# Patient Record
Sex: Female | Born: 1935 | Race: Black or African American | Hispanic: No | Smoking: Former smoker
Health system: Southern US, Community
[De-identification: ages and names within clinical notes are randomized; demographics above are authoritative.]

## PROBLEM LIST (undated history)

## (undated) DIAGNOSIS — E119 Type 2 diabetes mellitus without complications: Secondary | ICD-10-CM

## (undated) DIAGNOSIS — I10 Essential (primary) hypertension: Secondary | ICD-10-CM

## (undated) DIAGNOSIS — D649 Anemia, unspecified: Secondary | ICD-10-CM

## (undated) DIAGNOSIS — G459 Transient cerebral ischemic attack, unspecified: Secondary | ICD-10-CM

## (undated) DIAGNOSIS — R011 Cardiac murmur, unspecified: Secondary | ICD-10-CM

## (undated) DIAGNOSIS — K5790 Diverticulosis of intestine, part unspecified, without perforation or abscess without bleeding: Secondary | ICD-10-CM

## (undated) DIAGNOSIS — I639 Cerebral infarction, unspecified: Secondary | ICD-10-CM

## (undated) DIAGNOSIS — R2689 Other abnormalities of gait and mobility: Secondary | ICD-10-CM

## (undated) DIAGNOSIS — E7800 Pure hypercholesterolemia, unspecified: Secondary | ICD-10-CM

## (undated) HISTORY — PX: HERNIA REPAIR: SHX51

## (undated) HISTORY — PX: ECTOPIC PREGNANCY SURGERY: SHX613

## (undated) HISTORY — PX: TONSILLECTOMY: SUR1361

## (undated) HISTORY — PX: COLON SURGERY: SHX602

---

## 2004-04-14 DIAGNOSIS — G459 Transient cerebral ischemic attack, unspecified: Secondary | ICD-10-CM

## 2004-04-14 HISTORY — DX: Transient cerebral ischemic attack, unspecified: G45.9

## 2004-05-07 ENCOUNTER — Ambulatory Visit: Payer: Self-pay | Admitting: Unknown Physician Specialty

## 2004-05-21 ENCOUNTER — Ambulatory Visit: Payer: Self-pay | Admitting: Family Medicine

## 2004-10-22 ENCOUNTER — Ambulatory Visit: Payer: Self-pay | Admitting: Family Medicine

## 2005-07-09 ENCOUNTER — Ambulatory Visit: Payer: Self-pay | Admitting: Family Medicine

## 2005-07-13 ENCOUNTER — Other Ambulatory Visit: Payer: Self-pay

## 2005-07-13 ENCOUNTER — Emergency Department: Payer: Self-pay | Admitting: Emergency Medicine

## 2005-09-09 ENCOUNTER — Encounter: Payer: Self-pay | Admitting: Psychiatry

## 2005-09-12 ENCOUNTER — Encounter: Payer: Self-pay | Admitting: Psychiatry

## 2005-10-12 ENCOUNTER — Encounter: Payer: Self-pay | Admitting: Psychiatry

## 2005-10-22 ENCOUNTER — Emergency Department: Payer: Self-pay

## 2005-10-22 ENCOUNTER — Other Ambulatory Visit: Payer: Self-pay

## 2005-11-26 ENCOUNTER — Encounter: Payer: Self-pay | Admitting: Psychiatry

## 2005-12-04 ENCOUNTER — Inpatient Hospital Stay: Payer: Self-pay

## 2005-12-04 ENCOUNTER — Other Ambulatory Visit: Payer: Self-pay

## 2006-09-01 ENCOUNTER — Ambulatory Visit: Payer: Self-pay | Admitting: Family Medicine

## 2007-03-28 ENCOUNTER — Other Ambulatory Visit: Payer: Self-pay

## 2007-03-28 ENCOUNTER — Observation Stay: Payer: Self-pay | Admitting: Internal Medicine

## 2007-04-06 ENCOUNTER — Emergency Department: Payer: Self-pay | Admitting: Emergency Medicine

## 2007-04-23 ENCOUNTER — Ambulatory Visit: Payer: Self-pay | Admitting: Family Medicine

## 2007-06-30 ENCOUNTER — Other Ambulatory Visit: Payer: Self-pay

## 2007-07-01 ENCOUNTER — Inpatient Hospital Stay: Payer: Self-pay | Admitting: *Deleted

## 2007-08-11 ENCOUNTER — Ambulatory Visit: Payer: Self-pay | Admitting: Unknown Physician Specialty

## 2007-08-12 ENCOUNTER — Ambulatory Visit: Payer: Self-pay | Admitting: Unknown Physician Specialty

## 2007-08-22 ENCOUNTER — Emergency Department: Payer: Self-pay | Admitting: Emergency Medicine

## 2007-10-08 ENCOUNTER — Ambulatory Visit: Payer: Self-pay | Admitting: Family Medicine

## 2007-10-19 ENCOUNTER — Ambulatory Visit: Payer: Self-pay | Admitting: Family Medicine

## 2007-11-10 ENCOUNTER — Inpatient Hospital Stay: Payer: Self-pay | Admitting: Internal Medicine

## 2007-11-10 ENCOUNTER — Other Ambulatory Visit: Payer: Self-pay

## 2007-11-16 ENCOUNTER — Ambulatory Visit: Payer: Self-pay | Admitting: Family Medicine

## 2007-11-24 ENCOUNTER — Encounter: Payer: Self-pay | Admitting: Family Medicine

## 2007-12-14 ENCOUNTER — Encounter: Payer: Self-pay | Admitting: Family Medicine

## 2008-06-01 IMAGING — US US RENAL KIDNEY
1 series · 17 of 25 positions shown · non-contrast
Comparison: none

REASON FOR EXAM: Renal Insufficiency
COMMENTS:

[Series 1: us renal kidney · 17 of 34 slices shown]
[im 1/34]
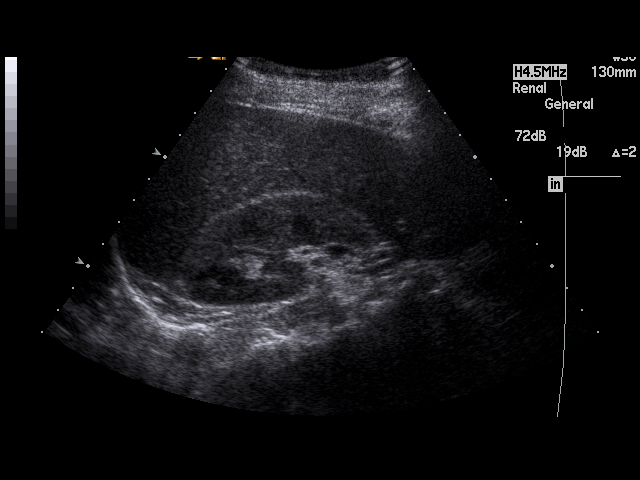
[im 3/34]
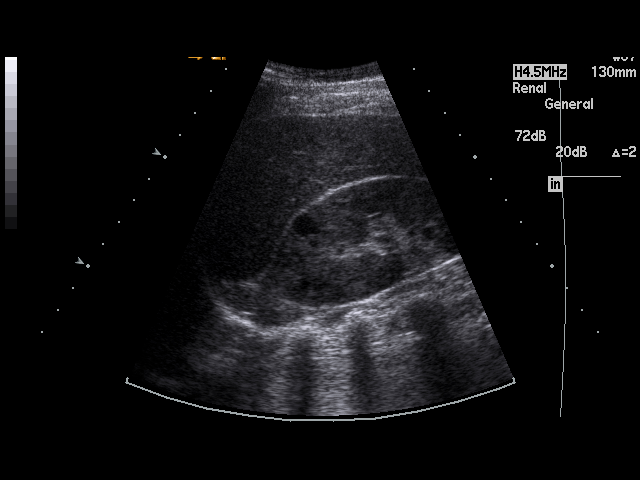
[im 5/34]
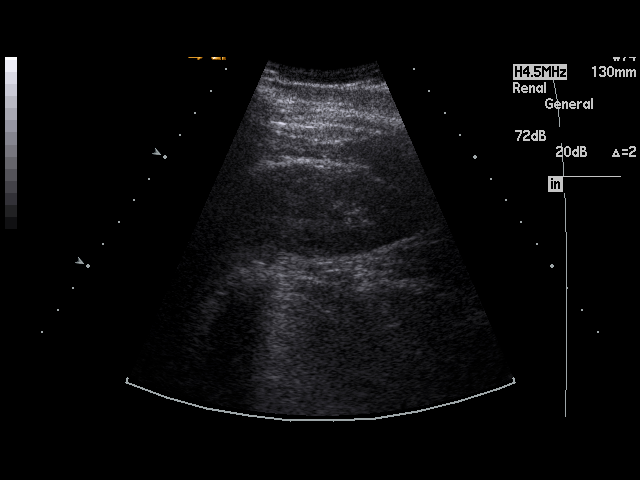
[im 7/34]
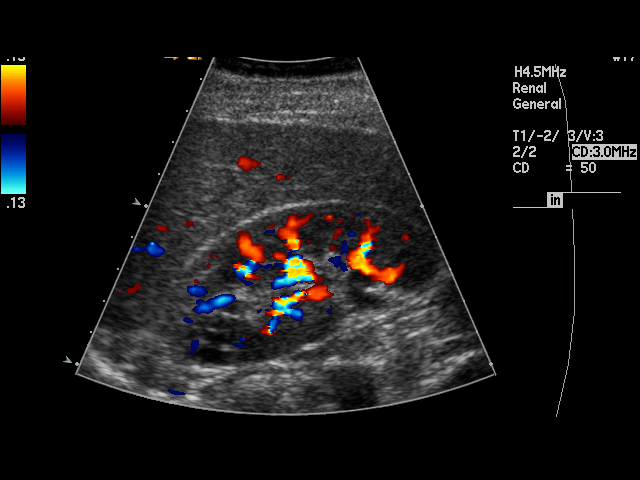
[im 9/34]
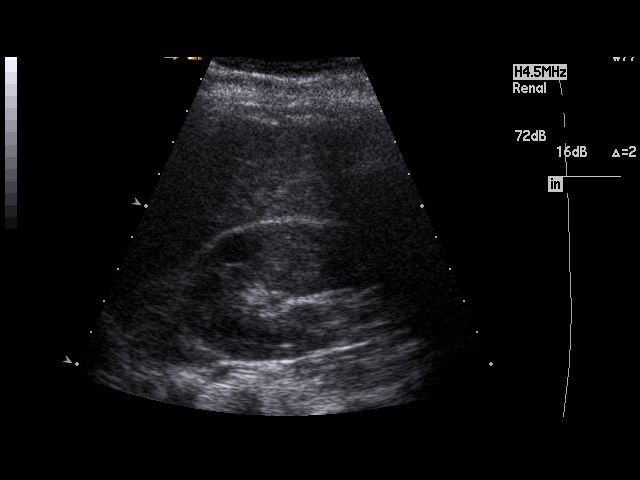
[im 12/34]
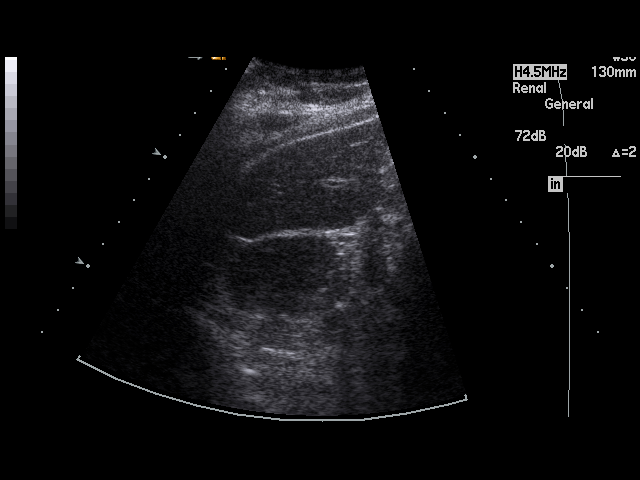
[im 13/34]
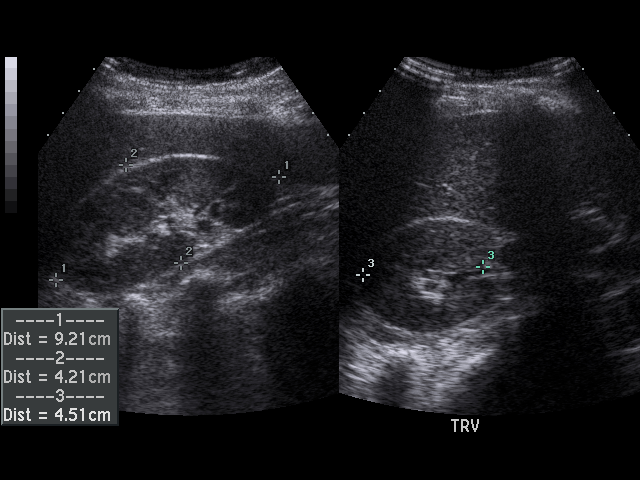
[im 16/34]
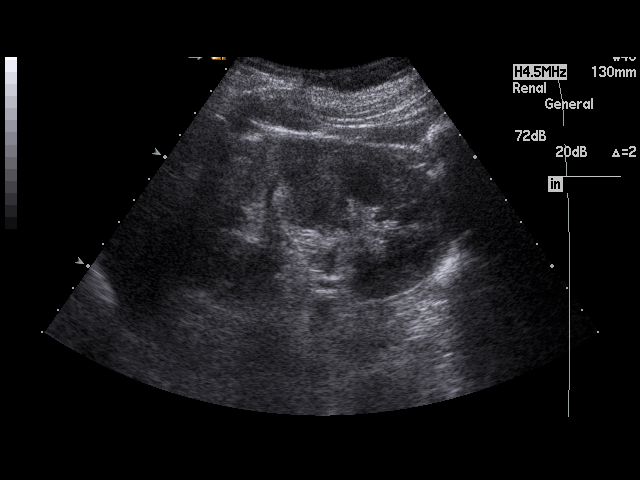
[im 17/34]
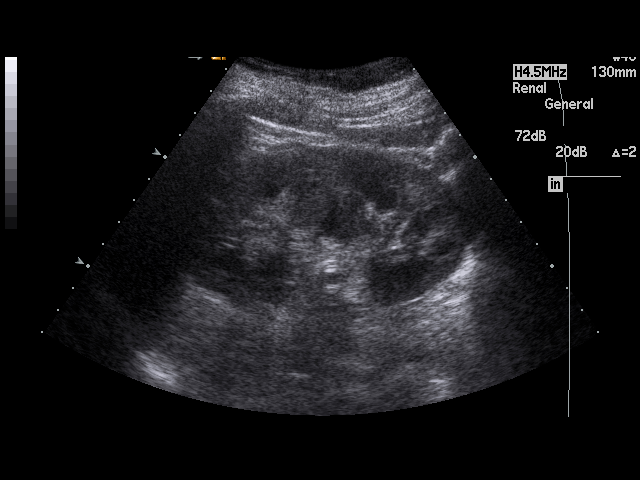
[im 18/34]
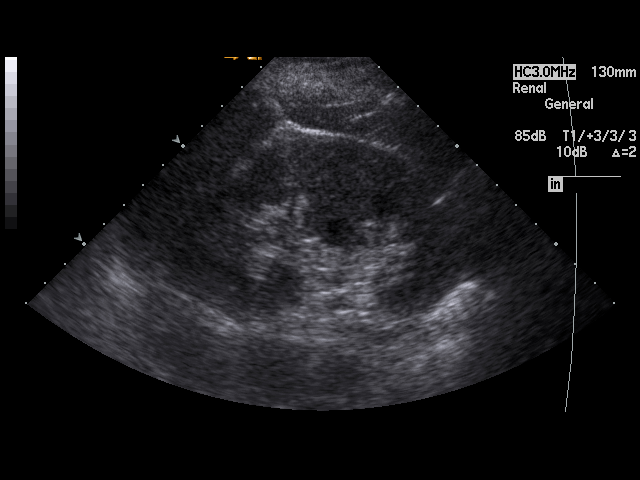
[im 21/34]
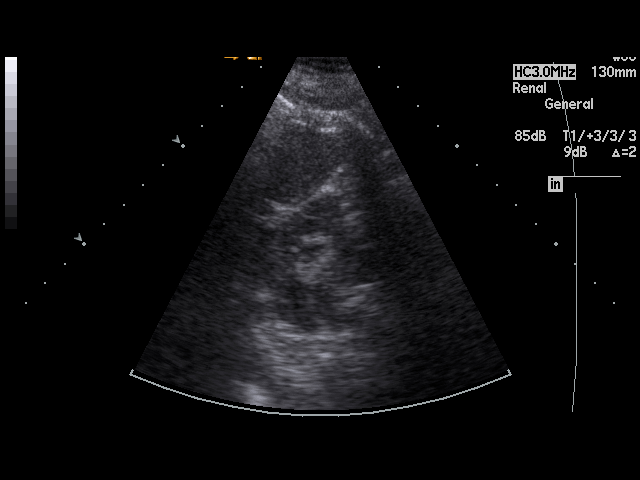
[im 23/34]
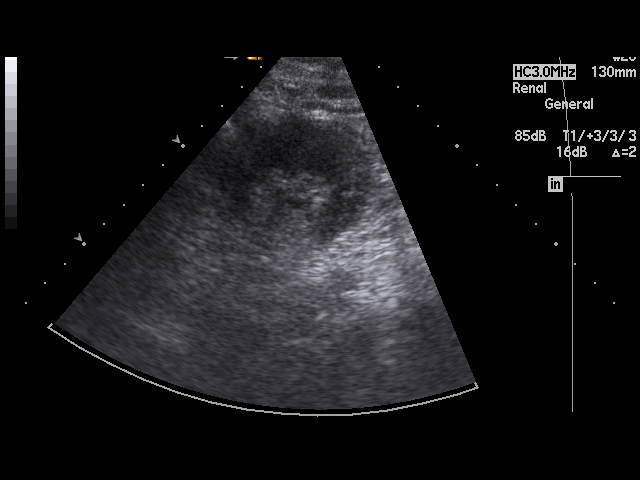
[im 25/34]
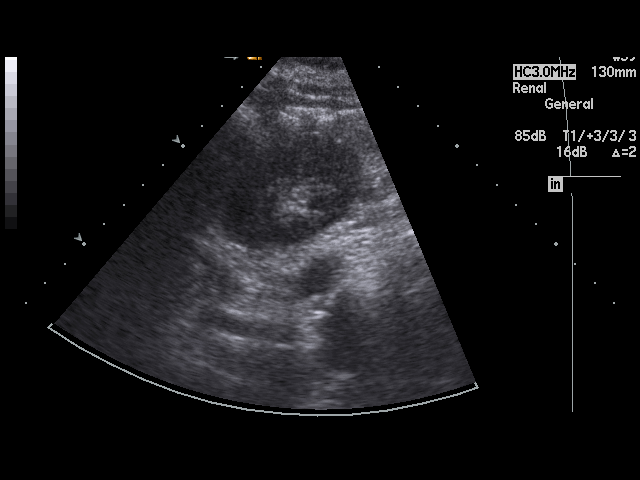
[im 27/34]
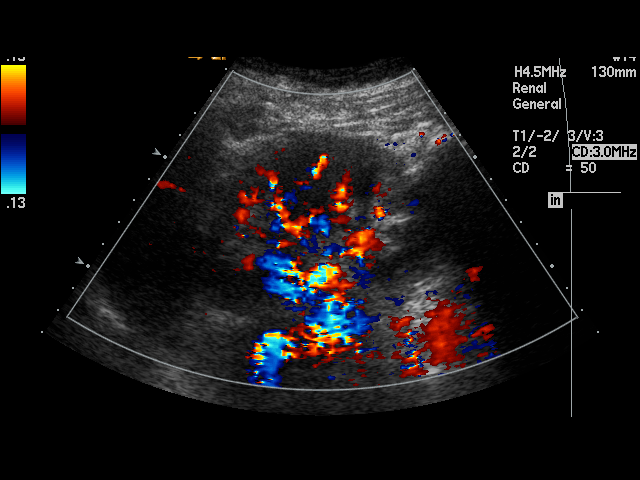
[im 29/34]
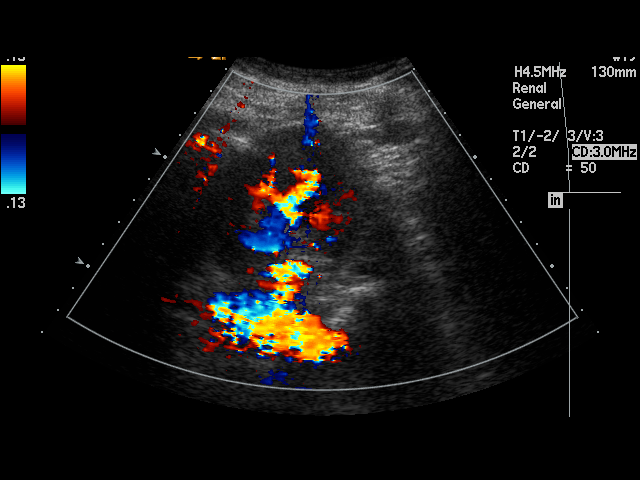
[im 31/34]
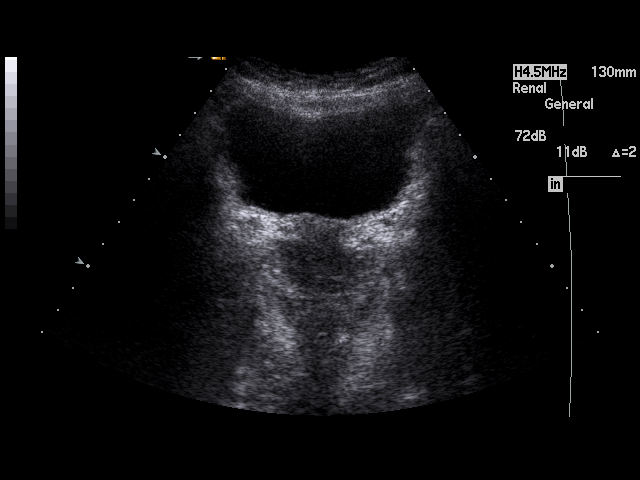
[im 34/34]
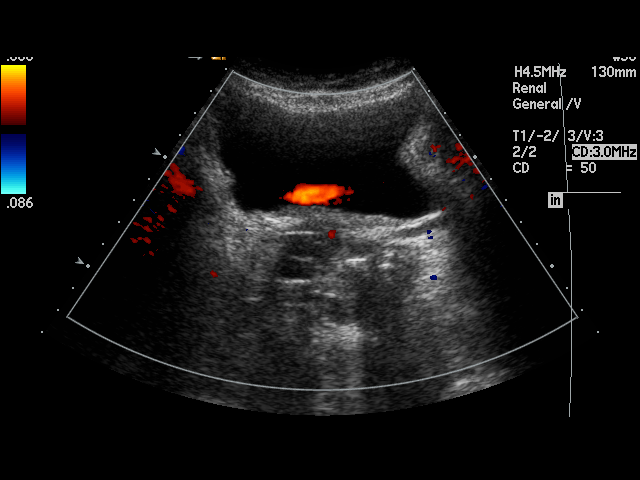

[17 of 25 positions shown; findings below may reference images not displayed]

PROCEDURE:     US  - US KIDNEY BILATERAL  - April 23, 2007  [DATE]

RESULT:     The RIGHT kidney measures 9.2 cm x 4.2 cm x 4.5 cm and the LEFT
kidney measures 9.89 cm x 6.1 cm x 4.45 cm.  There is a 1.05 cm cyst to the
RIGHT kidney. No other solid or cystic renal mass lesions are seen. No renal
calcifications are identified. The renal cortical margins are smooth. There
is no hydronephrosis. The kidneys show increased echogenicity bilaterally
compatible with the clinical history of renal insufficiency. The urinary
bladder is normal in appearance. There is no ascites.
IMPRESSION: 1. There is a 1.05 cm cyst to the upper pole of the RIGHT kidney.
2. The kidneys show increased echogenicity compatible with the clinical
history of renal insufficiency.
3. No hydronephrosis or other acute change is identified.

## 2008-07-27 ENCOUNTER — Encounter: Payer: Self-pay | Admitting: Family Medicine

## 2008-08-12 ENCOUNTER — Encounter: Payer: Self-pay | Admitting: Family Medicine

## 2008-09-12 ENCOUNTER — Encounter: Payer: Self-pay | Admitting: Family Medicine

## 2008-11-17 ENCOUNTER — Ambulatory Visit: Payer: Self-pay | Admitting: Family Medicine

## 2008-12-19 IMAGING — CT CT HEAD WITHOUT CONTRAST
2 series · 16 of 30 positions shown, 20 images · non-contrast
Comparison: none

REASON FOR EXAM: slurred speech
COMMENTS:

[Series 2: without · axial · non-contrast · 0.39mm/px · z∈[+715,+835]mm · 13 of 28 slices shown, 17 images]
[im 2/28  brain]
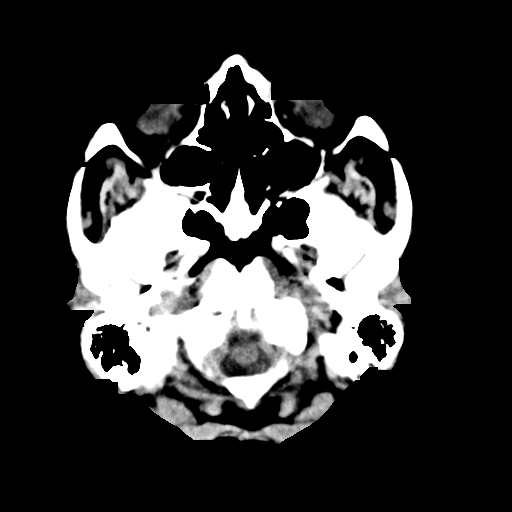
[im 2/28  bone]
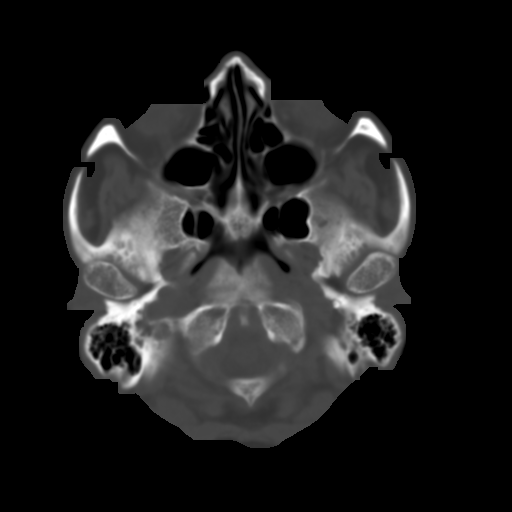
[im 4/28  brain]
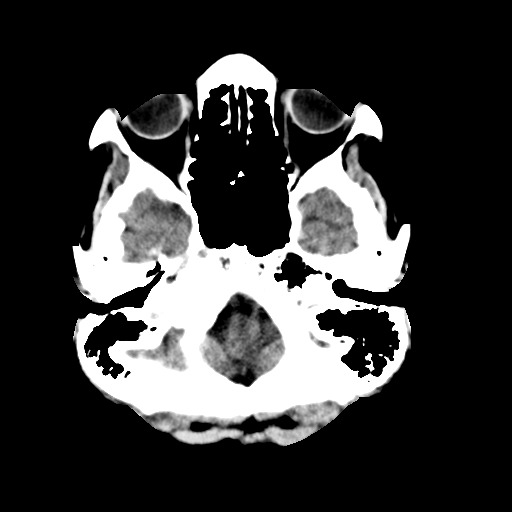
[im 6/28  brain]
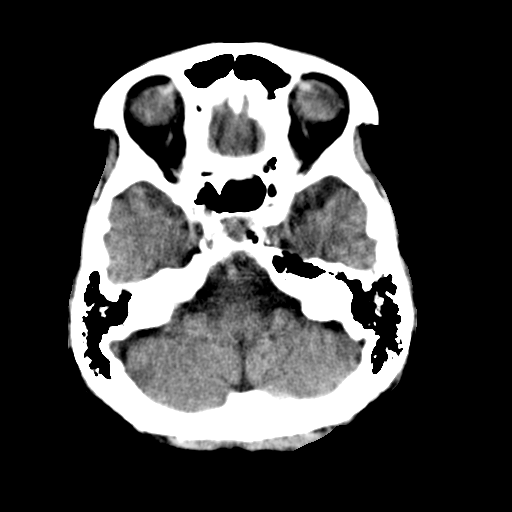
[im 8/28  brain]
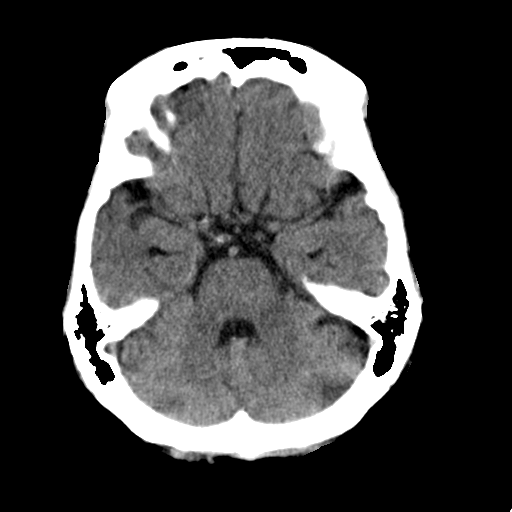
[im 10/28  brain]
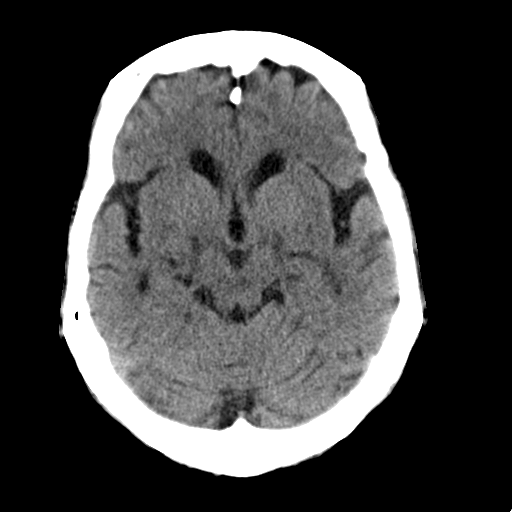
[im 10/28  bone]
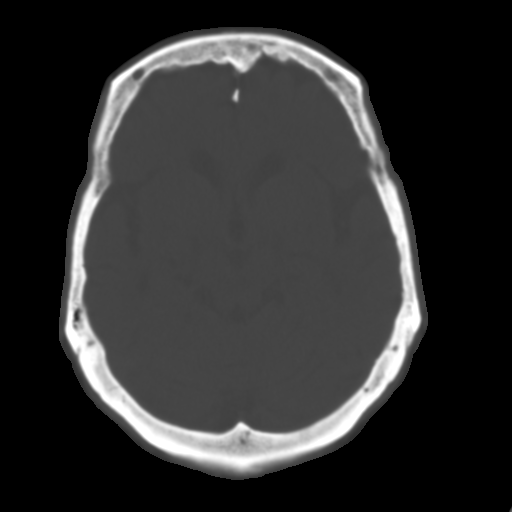
[im 12/28  brain]
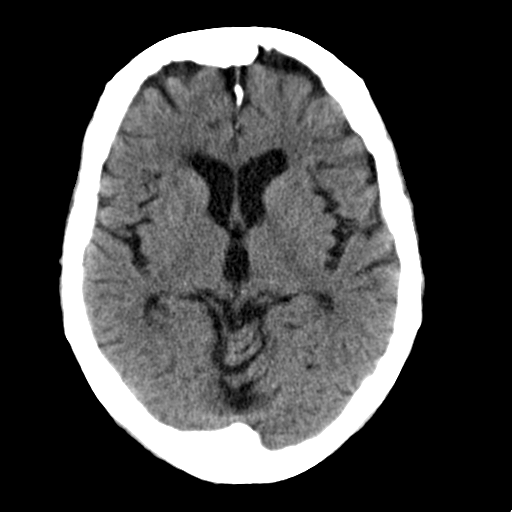
[im 14/28  brain]
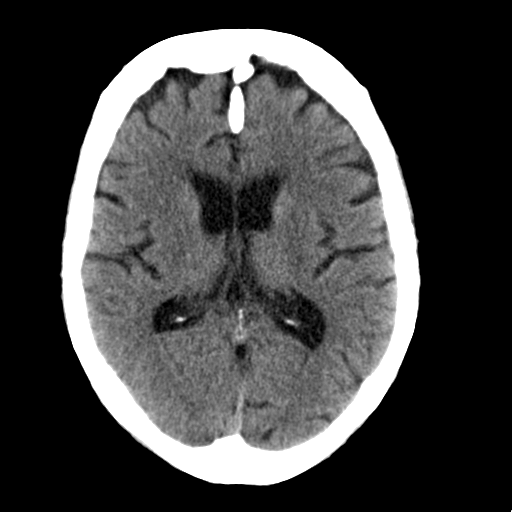
[im 16/28  brain]
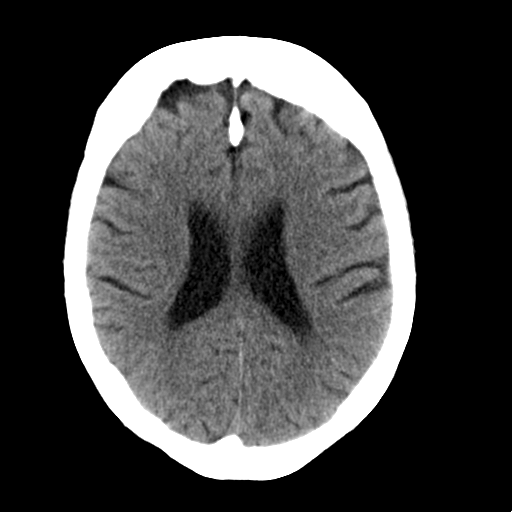
[im 18/28  brain]
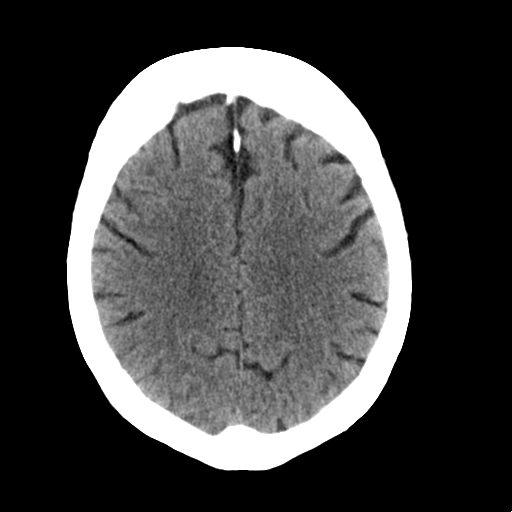
[im 18/28  bone]
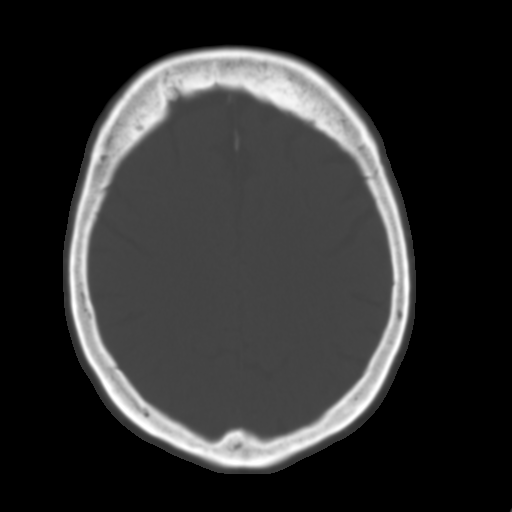
[im 20/28  brain]
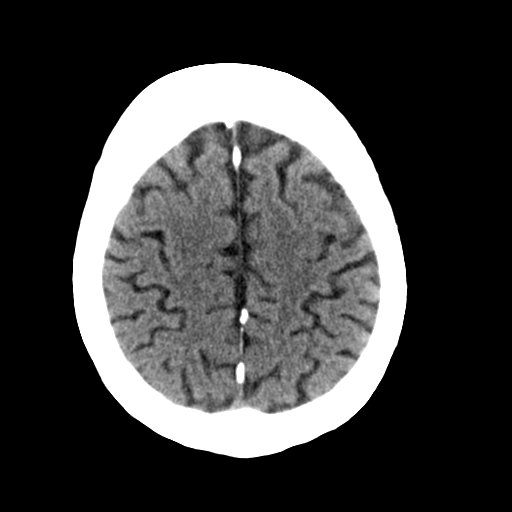
[im 22/28  brain]
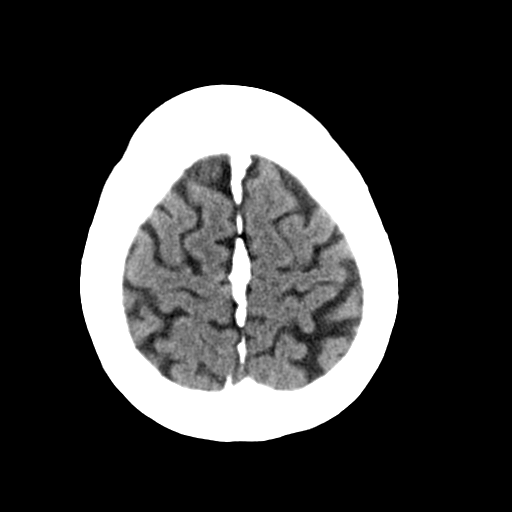
[im 24/28  brain]
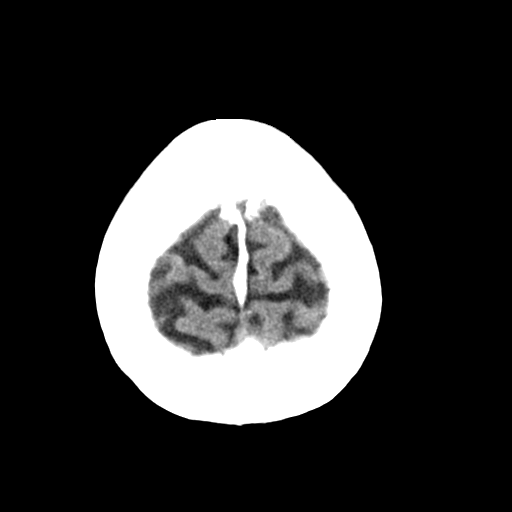
[im 26/28  brain]
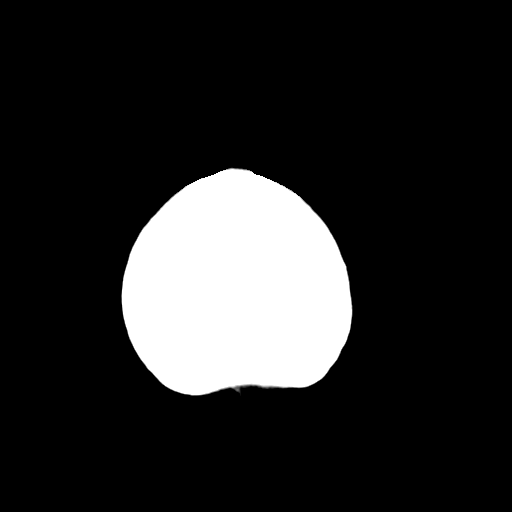
[im 26/28  bone]
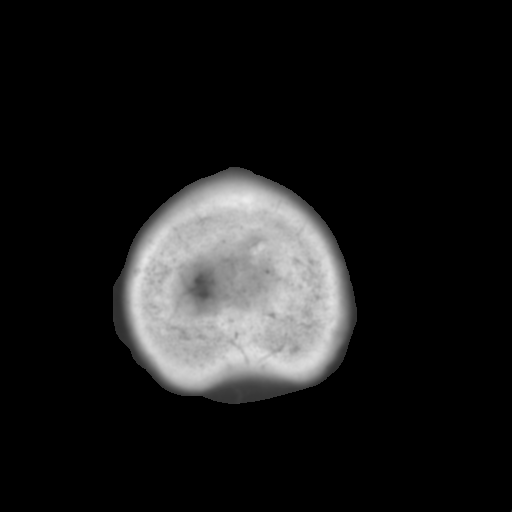

[Series 3: bone · axial · 0.39mm/px · z∈[+715,+755]mm · 3 of 28 slices shown]
[im 2/28  bone]
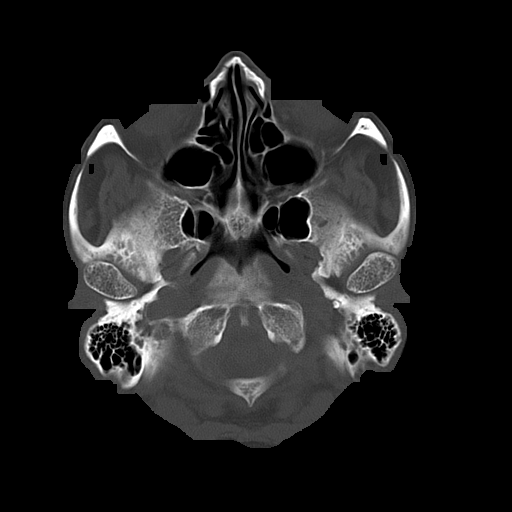
[im 6/28  bone]
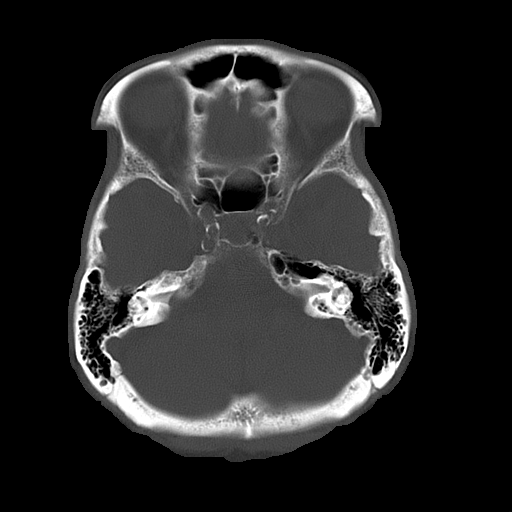
[im 10/28  bone]
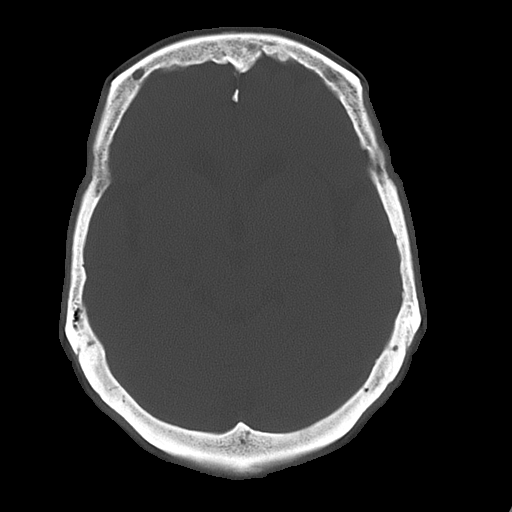

[16 of 30 positions shown; findings below may reference images not displayed]

PROCEDURE:     CT  - CT HEAD WITHOUT CONTRAST  - November 10, 2007  [DATE]

RESULT:     Noncontrasted emergent CT of the brain is performed. There is no
previous exam for comparison.

 There is prominence of the ventricles and sulci consistent with atrophy.
There is ill-defined low attenuation in the periventricular white matter
suggestive of chronic microvascular ischemic disease. There is no
hemorrhage, mass, mass-effect or midline shift. The included paranasal
sinuses and mastoids show normal aeration. There is no skull fracture or
bony destruction.
IMPRESSION: No acute intracranial abnormality evident. Changes of
atrophy and chronic microvascular ischemic disease are present.

## 2008-12-19 IMAGING — CR DG CHEST 1V PORT
1 series · 1 of 1 positions shown · non-contrast
Comparison: none

REASON FOR EXAM: Hypertension, TIA
COMMENTS:

[view not recorded]
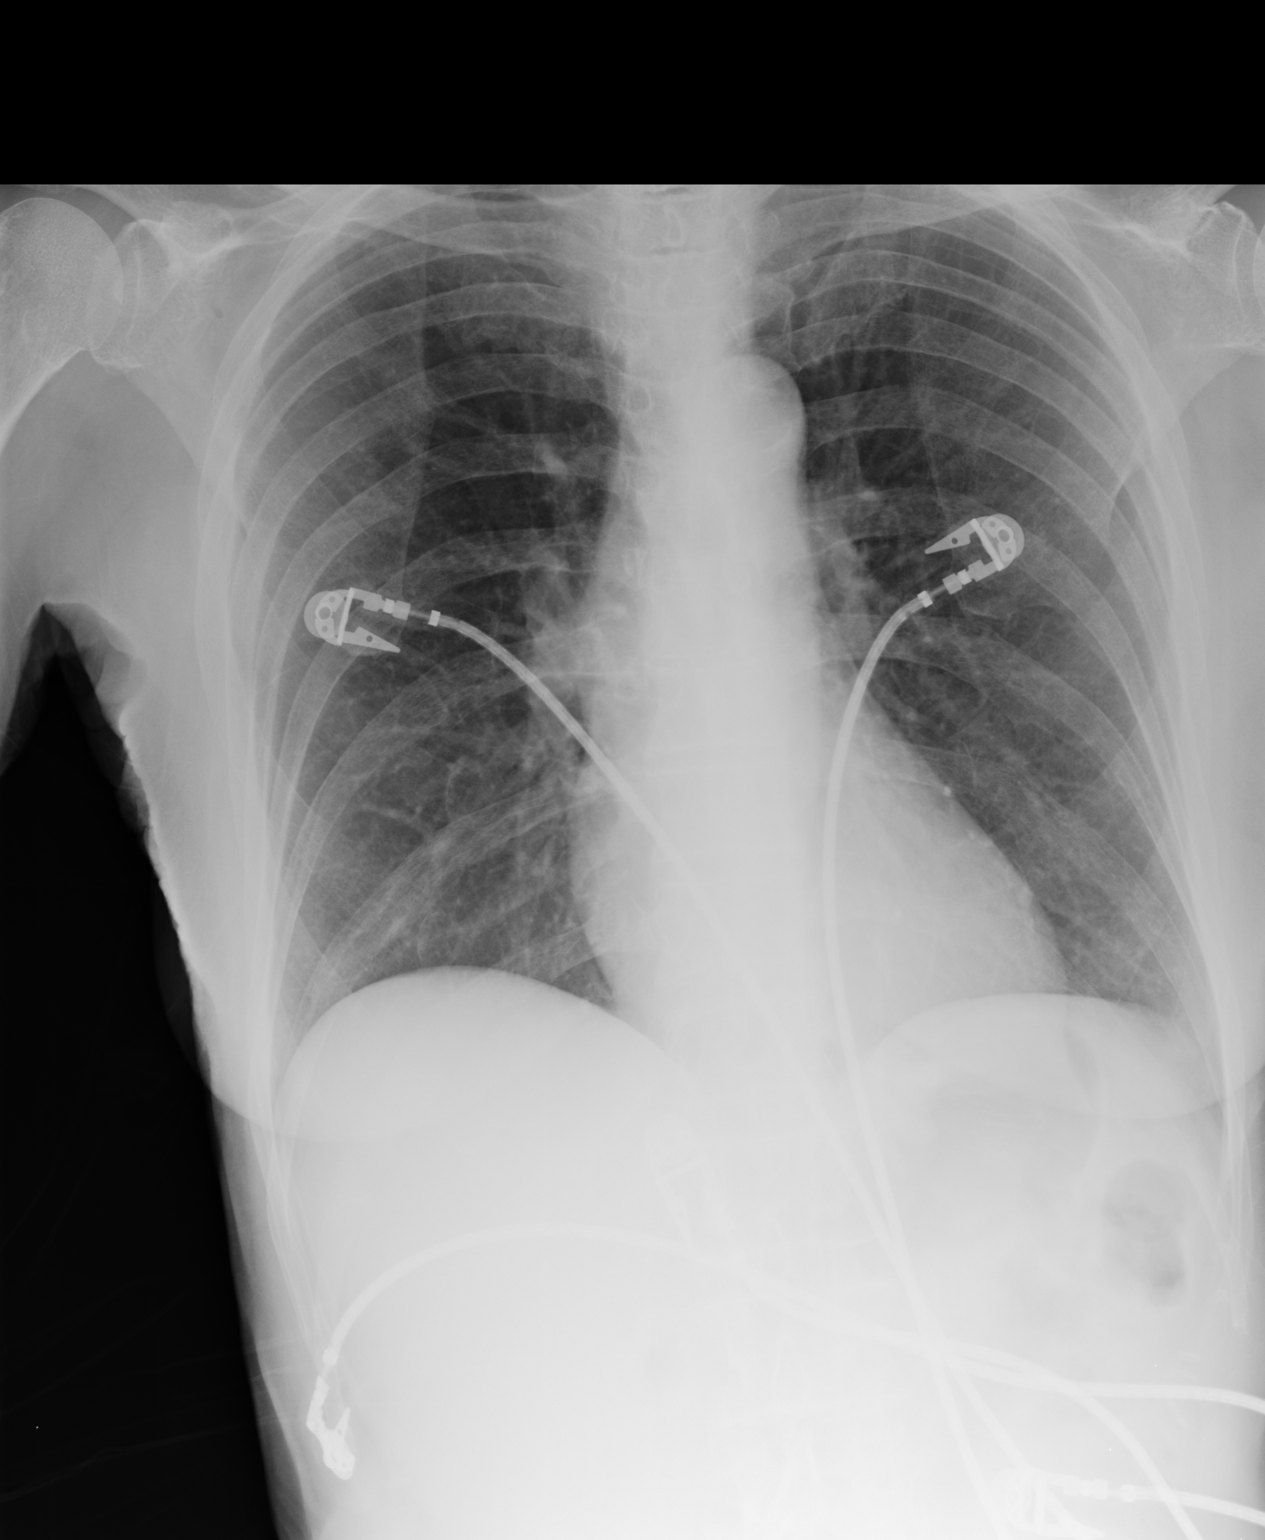

[1 of 1 positions shown; findings below may reference images not displayed]

PROCEDURE:     DXR - DXR PORTABLE CHEST SINGLE VIEW  - November 10, 2007  [DATE]

RESULT:     Comparison is made to the examination dated 10/19/2007.

Cardiac monitoring electrodes are present. The cardiac silhouette is normal.
There is no evidence of infiltrate, effusion or pneumothorax. There is no
underlying mass. There is no significant change accounting for differences
in technique.
IMPRESSION: No acute cardiopulmonary disease. There is mild
hyperinflation.

## 2009-03-12 ENCOUNTER — Emergency Department: Payer: Self-pay | Admitting: Emergency Medicine

## 2009-03-28 ENCOUNTER — Inpatient Hospital Stay: Payer: Self-pay | Admitting: Internal Medicine

## 2009-11-20 ENCOUNTER — Ambulatory Visit: Payer: Self-pay | Admitting: Family Medicine

## 2009-11-28 ENCOUNTER — Ambulatory Visit: Payer: Self-pay | Admitting: Family Medicine

## 2011-03-17 ENCOUNTER — Encounter: Payer: Self-pay | Admitting: Family Medicine

## 2011-03-25 ENCOUNTER — Ambulatory Visit: Payer: Self-pay | Admitting: Family Medicine

## 2011-04-04 ENCOUNTER — Ambulatory Visit: Payer: Self-pay | Admitting: Family Medicine

## 2011-04-15 ENCOUNTER — Ambulatory Visit: Payer: Self-pay | Admitting: Family Medicine

## 2011-04-15 ENCOUNTER — Encounter: Payer: Self-pay | Admitting: Family Medicine

## 2011-05-16 ENCOUNTER — Ambulatory Visit: Payer: Self-pay | Admitting: Family Medicine

## 2011-05-26 ENCOUNTER — Emergency Department: Payer: Self-pay | Admitting: Emergency Medicine

## 2011-06-13 ENCOUNTER — Ambulatory Visit: Payer: Self-pay | Admitting: Family Medicine

## 2011-12-19 ENCOUNTER — Ambulatory Visit: Payer: Self-pay | Admitting: Family Medicine

## 2012-03-31 ENCOUNTER — Ambulatory Visit: Payer: Self-pay | Admitting: Family Medicine

## 2012-08-24 ENCOUNTER — Inpatient Hospital Stay: Payer: Self-pay | Admitting: Internal Medicine

## 2012-08-24 LAB — URINALYSIS, COMPLETE
Bilirubin,UR: NEGATIVE
Nitrite: NEGATIVE
Protein: NEGATIVE
RBC,UR: <1 /HPF (ref 0–5)
Specific Gravity: 1.017 (ref 1.003–1.030)
Squamous Epithelial: 1

## 2012-08-24 LAB — COMPREHENSIVE METABOLIC PANEL
Anion Gap: 3 — ABNORMAL LOW (ref 7–16)
Bilirubin,Total: 0.3 mg/dL (ref 0.2–1.0)
Calcium, Total: 9.4 mg/dL (ref 8.5–10.1)
Chloride: 104 mmol/L (ref 98–107)
Co2: 30 mmol/L (ref 21–32)
EGFR (African American): >60
EGFR (Non-African Amer.): >60
SGOT(AST): 31 U/L (ref 15–37)
SGPT (ALT): 23 U/L (ref 12–78)
Sodium: 137 mmol/L (ref 136–145)
Total Protein: 8.3 g/dL — ABNORMAL HIGH (ref 6.4–8.2)

## 2012-08-24 LAB — CBC
HCT: 30.7 % — ABNORMAL LOW (ref 35.0–47.0)
HGB: 10.2 g/dL — ABNORMAL LOW (ref 12.0–16.0)
Platelet: 233 10*3/uL (ref 150–440)
RBC: 3.74 10*6/uL — ABNORMAL LOW (ref 3.80–5.20)
RDW: 14.6 % — ABNORMAL HIGH (ref 11.5–14.5)
WBC: 6.2 10*3/uL (ref 3.6–11.0)

## 2012-08-24 LAB — LIPASE, BLOOD: Lipase: 62 U/L — ABNORMAL LOW (ref 73–393)

## 2012-08-25 LAB — BASIC METABOLIC PANEL
Chloride: 109 mmol/L — ABNORMAL HIGH (ref 98–107)
Co2: 28 mmol/L (ref 21–32)
Glucose: 97 mg/dL (ref 65–99)
Osmolality: 283 (ref 275–301)
Sodium: 141 mmol/L (ref 136–145)

## 2012-08-25 LAB — CBC WITH DIFFERENTIAL/PLATELET
Basophil #: 0.1 10*3/uL (ref 0.0–0.1)
Basophil %: 0.7 %
Eosinophil #: 0.1 10*3/uL (ref 0.0–0.7)
Eosinophil %: 1.1 %
HCT: 25.4 % — ABNORMAL LOW (ref 35.0–47.0)
HGB: 8.6 g/dL — ABNORMAL LOW (ref 12.0–16.0)
Lymphocyte #: 1.8 10*3/uL (ref 1.0–3.6)
Lymphocyte %: 24.9 %
MCH: 27.6 pg (ref 26.0–34.0)
MCHC: 33.8 g/dL (ref 32.0–36.0)
MCV: 82 fL (ref 80–100)
Monocyte #: 0.6 x10 3/mm (ref 0.2–0.9)
Monocyte %: 8.8 %
Neutrophil #: 4.7 10*3/uL (ref 1.4–6.5)
Neutrophil %: 64.5 %
Platelet: 182 10*3/uL (ref 150–440)
RBC: 3.11 10*6/uL — ABNORMAL LOW (ref 3.80–5.20)
RDW: 14.3 % (ref 11.5–14.5)
WBC: 7.2 10*3/uL (ref 3.6–11.0)

## 2012-08-25 LAB — HEMOGLOBIN: HGB: 9.6 g/dL — ABNORMAL LOW (ref 12.0–16.0)

## 2012-08-26 LAB — CBC WITH DIFFERENTIAL/PLATELET
Basophil #: 0.1 10*3/uL (ref 0.0–0.1)
Eosinophil #: 0.3 10*3/uL (ref 0.0–0.7)
HCT: 27.2 % — ABNORMAL LOW (ref 35.0–47.0)
Lymphocyte #: 1.8 10*3/uL (ref 1.0–3.6)
MCH: 28.1 pg (ref 26.0–34.0)
MCV: 82 fL (ref 80–100)
Monocyte #: 0.6 x10 3/mm (ref 0.2–0.9)
Neutrophil #: 4.2 10*3/uL (ref 1.4–6.5)
Neutrophil %: 60.6 %
Platelet: 211 10*3/uL (ref 150–440)
RBC: 3.31 10*6/uL — ABNORMAL LOW (ref 3.80–5.20)

## 2012-08-26 LAB — BASIC METABOLIC PANEL
Anion Gap: 6 — ABNORMAL LOW (ref 7–16)
BUN: 13 mg/dL (ref 7–18)
Calcium, Total: 9 mg/dL (ref 8.5–10.1)
Chloride: 109 mmol/L — ABNORMAL HIGH (ref 98–107)
EGFR (Non-African Amer.): >60
Glucose: 76 mg/dL (ref 65–99)
Potassium: 3.6 mmol/L (ref 3.5–5.1)
Sodium: 141 mmol/L (ref 136–145)

## 2012-08-27 LAB — CBC WITH DIFFERENTIAL/PLATELET
Basophil #: 0.1 10*3/uL (ref 0.0–0.1)
Basophil %: 0.8 %
Eosinophil %: 2.3 %
HGB: 8.4 g/dL — ABNORMAL LOW (ref 12.0–16.0)
Lymphocyte #: 2.3 10*3/uL (ref 1.0–3.6)
Lymphocyte %: 31.5 %
MCH: 27.6 pg (ref 26.0–34.0)
MCHC: 33.5 g/dL (ref 32.0–36.0)
Monocyte #: 0.7 x10 3/mm (ref 0.2–0.9)
Neutrophil #: 4.1 10*3/uL (ref 1.4–6.5)
WBC: 7.3 10*3/uL (ref 3.6–11.0)

## 2012-08-27 LAB — HEMOGLOBIN: HGB: 8.7 g/dL — ABNORMAL LOW (ref 12.0–16.0)

## 2012-08-28 LAB — BASIC METABOLIC PANEL
Anion Gap: 5 — ABNORMAL LOW (ref 7–16)
BUN: 7 mg/dL (ref 7–18)
Calcium, Total: 8.4 mg/dL — ABNORMAL LOW (ref 8.5–10.1)
Chloride: 112 mmol/L — ABNORMAL HIGH (ref 98–107)
Co2: 27 mmol/L (ref 21–32)
Creatinine: 0.64 mg/dL (ref 0.60–1.30)
EGFR (Non-African Amer.): >60
Glucose: 96 mg/dL (ref 65–99)
Osmolality: 285 (ref 275–301)
Potassium: 3.5 mmol/L (ref 3.5–5.1)

## 2012-08-28 LAB — CBC WITH DIFFERENTIAL/PLATELET
Basophil #: 0 10*3/uL (ref 0.0–0.1)
Basophil %: 0.6 %
Eosinophil %: 4.5 %
HGB: 7.3 g/dL — ABNORMAL LOW (ref 12.0–16.0)
Lymphocyte #: 1.6 10*3/uL (ref 1.0–3.6)
Lymphocyte %: 25.1 %
MCH: 28.4 pg (ref 26.0–34.0)
MCHC: 34.4 g/dL (ref 32.0–36.0)
Monocyte #: 0.6 x10 3/mm (ref 0.2–0.9)
Neutrophil %: 60.1 %
RBC: 2.57 10*6/uL — ABNORMAL LOW (ref 3.80–5.20)
RDW: 14.6 % — ABNORMAL HIGH (ref 11.5–14.5)
WBC: 6.3 10*3/uL (ref 3.6–11.0)

## 2012-08-29 LAB — TSH: Thyroid Stimulating Horm: 0.874 u[IU]/mL

## 2012-08-29 LAB — HEMOGLOBIN: HGB: 6.8 g/dL — ABNORMAL LOW (ref 12.0–16.0)

## 2012-12-14 ENCOUNTER — Inpatient Hospital Stay: Payer: Self-pay | Admitting: Surgery

## 2012-12-14 LAB — COMPREHENSIVE METABOLIC PANEL
Albumin: 3.5 g/dL (ref 3.4–5.0)
Alkaline Phosphatase: 66 U/L (ref 50–136)
Anion Gap: 7 (ref 7–16)
Bilirubin,Total: 0.1 mg/dL — ABNORMAL LOW (ref 0.2–1.0)
Calcium, Total: 9.2 mg/dL (ref 8.5–10.1)
Chloride: 105 mmol/L (ref 98–107)
Co2: 25 mmol/L (ref 21–32)
Creatinine: 0.8 mg/dL (ref 0.60–1.30)
EGFR (Non-African Amer.): >60
Glucose: 223 mg/dL — ABNORMAL HIGH (ref 65–99)
Osmolality: 282 (ref 275–301)
Potassium: 4.3 mmol/L (ref 3.5–5.1)
SGOT(AST): 34 U/L (ref 15–37)
SGPT (ALT): 24 U/L (ref 12–78)
Total Protein: 8.8 g/dL — ABNORMAL HIGH (ref 6.4–8.2)

## 2012-12-14 LAB — PROTIME-INR: Prothrombin Time: 13.5 secs (ref 11.5–14.7)

## 2012-12-14 LAB — CBC
HCT: 34.5 % — ABNORMAL LOW (ref 35.0–47.0)
Platelet: 247 10*3/uL (ref 150–440)
WBC: 7.3 10*3/uL (ref 3.6–11.0)

## 2012-12-14 LAB — APTT: Activated PTT: 37.4 secs — ABNORMAL HIGH (ref 23.6–35.9)

## 2012-12-15 LAB — HEMOGLOBIN: HGB: 9 g/dL — ABNORMAL LOW (ref 12.0–16.0)

## 2012-12-16 LAB — HEMOGLOBIN
HGB: 8.3 g/dL — ABNORMAL LOW (ref 12.0–16.0)
HGB: 9.2 g/dL — ABNORMAL LOW (ref 12.0–16.0)
HGB: 8.4 g/dL — ABNORMAL LOW (ref 12.0–16.0)

## 2012-12-17 LAB — HEMOGLOBIN: HGB: 7.6 g/dL — ABNORMAL LOW (ref 12.0–16.0)

## 2012-12-18 LAB — CBC WITH DIFFERENTIAL/PLATELET
Basophil #: 0 10*3/uL (ref 0.0–0.1)
Basophil %: 0.4 %
Eosinophil #: 0.1 10*3/uL (ref 0.0–0.7)
HCT: 20.2 % — ABNORMAL LOW (ref 35.0–47.0)
HGB: 6.7 g/dL — ABNORMAL LOW (ref 12.0–16.0)
Lymphocyte #: 1.8 10*3/uL (ref 1.0–3.6)
Lymphocyte %: 18.7 %
Monocyte #: 0.6 x10 3/mm (ref 0.2–0.9)
Monocyte %: 6.6 %
Neutrophil %: 73.2 %
Platelet: 196 10*3/uL (ref 150–440)
RBC: 2.46 10*6/uL — ABNORMAL LOW (ref 3.80–5.20)
RDW: 15 % — ABNORMAL HIGH (ref 11.5–14.5)
WBC: 9.7 10*3/uL (ref 3.6–11.0)

## 2012-12-20 LAB — CBC WITH DIFFERENTIAL/PLATELET
Basophil #: 0 10*3/uL (ref 0.0–0.1)
Eosinophil #: 0 10*3/uL (ref 0.0–0.7)
HCT: 23.3 % — ABNORMAL LOW (ref 35.0–47.0)
Lymphocyte #: 1.1 10*3/uL (ref 1.0–3.6)
Lymphocyte %: 11.7 %
MCH: 27.5 pg (ref 26.0–34.0)
MCHC: 33.4 g/dL (ref 32.0–36.0)
Monocyte %: 8.3 %
Neutrophil #: 7.5 10*3/uL — ABNORMAL HIGH (ref 1.4–6.5)
Neutrophil %: 79.8 %
RBC: 2.83 10*6/uL — ABNORMAL LOW (ref 3.80–5.20)
RDW: 15.7 % — ABNORMAL HIGH (ref 11.5–14.5)
WBC: 9.4 10*3/uL (ref 3.6–11.0)

## 2012-12-20 LAB — BASIC METABOLIC PANEL
Calcium, Total: 7.8 mg/dL — ABNORMAL LOW (ref 8.5–10.1)
Chloride: 111 mmol/L — ABNORMAL HIGH (ref 98–107)
Co2: 27 mmol/L (ref 21–32)
Creatinine: 0.64 mg/dL (ref 0.60–1.30)
Glucose: 202 mg/dL — ABNORMAL HIGH (ref 65–99)
Osmolality: 285 (ref 275–301)
Potassium: 3.7 mmol/L (ref 3.5–5.1)

## 2012-12-21 LAB — PATHOLOGY REPORT

## 2012-12-22 LAB — CBC WITH DIFFERENTIAL/PLATELET
Basophil #: 0 10*3/uL (ref 0.0–0.1)
Basophil %: 0.4 %
Eosinophil #: 0.1 10*3/uL (ref 0.0–0.7)
HCT: 24.3 % — ABNORMAL LOW (ref 35.0–47.0)
HGB: 8.3 g/dL — ABNORMAL LOW (ref 12.0–16.0)
MCH: 27.9 pg (ref 26.0–34.0)
Monocyte #: 0.6 x10 3/mm (ref 0.2–0.9)
Monocyte %: 9.1 %
Platelet: 239 10*3/uL (ref 150–440)
RBC: 2.96 10*6/uL — ABNORMAL LOW (ref 3.80–5.20)
WBC: 6.8 10*3/uL (ref 3.6–11.0)

## 2012-12-22 LAB — BASIC METABOLIC PANEL
Anion Gap: 5 — ABNORMAL LOW (ref 7–16)
BUN: 5 mg/dL — ABNORMAL LOW (ref 7–18)
Calcium, Total: 8.5 mg/dL (ref 8.5–10.1)
Chloride: 107 mmol/L (ref 98–107)
Co2: 29 mmol/L (ref 21–32)
Creatinine: 0.6 mg/dL (ref 0.60–1.30)
EGFR (Non-African Amer.): >60
Osmolality: 280 (ref 275–301)
Potassium: 3.5 mmol/L (ref 3.5–5.1)

## 2012-12-24 LAB — CBC WITH DIFFERENTIAL/PLATELET
Basophil #: 0.1 10*3/uL (ref 0.0–0.1)
Basophil %: 0.7 %
Eosinophil %: 4.1 %
Lymphocyte %: 16.4 %
MCH: 28.6 pg (ref 26.0–34.0)
Monocyte %: 7.5 %
Neutrophil #: 6.4 10*3/uL (ref 1.4–6.5)
Neutrophil %: 71.3 %
Platelet: 347 10*3/uL (ref 150–440)
RBC: 3.11 10*6/uL — ABNORMAL LOW (ref 3.80–5.20)
WBC: 9 10*3/uL (ref 3.6–11.0)

## 2012-12-25 ENCOUNTER — Encounter: Payer: Self-pay | Admitting: Internal Medicine

## 2013-01-12 ENCOUNTER — Encounter: Payer: Self-pay | Admitting: Internal Medicine

## 2013-04-12 ENCOUNTER — Ambulatory Visit: Payer: Self-pay | Admitting: Family Medicine

## 2014-05-04 ENCOUNTER — Ambulatory Visit: Payer: Self-pay | Admitting: Family Medicine

## 2014-05-22 ENCOUNTER — Emergency Department: Payer: Self-pay | Admitting: Emergency Medicine

## 2014-08-04 NOTE — Discharge Summary (Signed)
PATIENT NAME:  Susan [name], Susan [name] MR#:  [id number] DATE OF BIRTH:  30-Nov-1926  DATE OF ADMISSION:  08/24/2012 DATE OF DISCHARGE:  08/29/2012  ADMITTING DIAGNOSIS: Gastrointestinal bleed.  DISCHARGE DIAGNOSES:  1.  Suspected diverticular bleed status post 2 units of packed red blood cell transfusion.  2.  Status post sigmoid branch of inferior mesenteric artery embolization, on the 16th of May 2014, by Dr. Wyn Quaker.   3.  History of hypertension, hyperlipidemia, diabetes mellitus, transient ischemic attack, as well as bladder incontinence.   DISCHARGE CONDITION: Stable.   DISCHARGE MEDICATIONS: The patient is to resume her outpatient medications which are:  1.  Calcium with vitamin D 200/200 one tablet daily at bedtime.  2.  Glipizide 2.5 mg p.o. daily.  3.  Lipitor 20 mg p.o. at bedtime.  4.  Metformin 500 mg p.o. twice daily.  5.  Amlodipine 10 mg p.o. at bedtime.  6.  Vitamin C unknown dose 1 tablet once daily.  7.  Quinapril 40 mg p.o. twice daily.  8.  Oxybutynin extended release 10 mg once daily.  9.  Multivitamin once daily.  10.  Colace 100 mg p.o. twice daily.  11.  Iron sulfate 325 mg p.o. twice daily with meals.   NOTE:  The patient is not to take aspirin until recommended by primary care physician.   HOME OXYGEN: None.   DIET: 2 grams salt, low fat, low cholesterol, carbohydrate-controlled diet, mechanical soft. The patient was advised to advance to regular over the next 1 week.   ACTIVITY LIMITATIONS: As tolerated.   FOLLOW-UP APPOINTMENT:  With Dr. Maryruth Hancock in 2 days after discharge and have hemoglobin checked in approximately 2 or 3 days after discharge.  CONSULTANTS: Care management, Dr. Festus Barren.   DIAGNOSTICS:  Chest, portable single view, 13th of May 2014, revealed no acute cardiopulmonary disease.   GI blood loss study, nuclear medicine, 13th of May 2014, revealed findings concerning for active gastrointestinal hemorrhage possibly in the pelvic region, presumably  in the sigmoid colon region.   HISTORY OF PRESENT ILLNESS:  The patient is an 81 year old female with past medical history significant for history of diverticular bleed in the past who presented to the hospital with complaints of bleeding. Please refer to Dr. Mathews Robinsons admission note on the 13th of May 2014. Apparently she was passing dark clots, she was feeling dizzy, and decided to come for further evaluation.   On arrival to the Emergency Room, the patient's temperature was 98.6, pulse was 82, respiratory rate was 18, blood pressure 171/82, and saturation was 99% on room air. Physical exam revealed normal abdominal exam.  No tenderness and no organomegaly was noted, or masses.   The patient's lab data, done on the day of admission, 13th of May 2014, revealed elevated glucose level to 179, otherwise BMP was unremarkable. The patient's lipase level was low at 62. Liver enzymes were unremarkable. However, the patient's total protein was mildly elevated at 8.3. Otherwise, liver enzymes were normal. White blood cell count was normal at 6.2, hemoglobin was 10.2, and platelet count was 233. Urinalysis revealed yellow/clear urine, 50 mg/dL of glucose, negative for bilirubin or ketones, specific gravity 1.017, pH was 7.0, negative for blood, protein, nitrites, trace leukocyte esterase was noted, less than 1 red blood cell, 2 white blood cells, trace bacteria, 1 epithelial cell, as well as mucus was present.   EKG showed normal sinus rhythm at 94 beats per minute, right bundle branch block.  No significant change since prior  EKG done in December 2010.    The patient's chest x-ray was normal and the patient's bleeding scan was positive for bleeding in sigmoid area of the colon.    HOSPITAL COURSE:  The patient was admitted to the hospital for further evaluation. Her hemoglobin was followed.  She was started on IV fluids. With IV fluids, the patient's hemoglobin level declined to 8.4, on the 13th of May 2014 at  around 5:00 p.m. However, the patient refused to have vascular procedure done. The patient was transfused with 1 unit of packed red blood cells after which the patient's hemoglobin level improved to 9.1, on the 14th of may 2014, and did remain relatively stable through the 15th of May 2014. It dropped down again to 8.4, on the 16th of May 2014. At that time, the patient was re-bleeding again and the patient was taken to OR by vascular surgeon, Dr. Wyn Quakerew, who proceeded to mesenteric embolization of sigmoid branch of IMA.  After this procedure, the patient felt satisfactory, did not complain of any significant discomfort. She had no cramping or abdominal pain and her hemoglobin level improved. She had to be transfused 1 more unit, on the 18th of May 2014, since her hemoglobin dropped down to 6.8 with no bleeding, but just with IV fluid administration.  After transfusion with 1 unit the patient's hemoglobin level rose to 9.6 by the end of the day, 08/29/2012. It was felt that the patient is ready to be discharged home. She was advised to continue a liquid diet as well as soft diet initially and slowly advance to a regular diet over the next 1 week. She was also advised not to take aspirin therapy until she is recommended to by her primary care physician.   In regards to hypertension, the patient was restarted on her blood pressure medications.  For hyperlipidemia, the patient's lipid panel was checked, and she was continued on her lipid lowering medications.  The patient's lipid panel was not rechecked during this admission; however, the patient was continued on her usual antihyperlipidemic medications. It is recommended to check the patient's lipid panel as previously recommended.  For diabetes mellitus, the patient's diabetes was relatively well controlled. She is to continue her outpatient management. She had no hemoglobin A1c done during this admission as well. However, the patient's glucose levels were around  80s to 130s and was managed with sliding scale insulin while in the hospital.   For TIA, the patient, as mentioned above, will be holding aspirin therapy until recommended by primary care physician. It is recommended to restart it in the next 1 week.  For bladder incontinence, the patient is to continue her management with oxybutynin.   The patient is being discharged in stable condition with the above-mentioned medications and follow-up. Her vital signs on the day of discharge: Temperature is 98.5, pulse 70s to 80s, respiratory rate was 18, blood pressure 155/73, and saturation is 99% on room air at rest.   TIME SPENT: 40 minutes.  ___________________________ Katharina Caperima Geeta Dworkin, MD rv:sb D: 08/29/2012 18:14:57 ET T: 08/30/2012 08:33:50 ET JOB#: [id number]  cc: Katharina Caperima Selene Peltzer, MD, <Dictator> Sarah "Sallie" Allena KatzPatel, MD Katharina CaperIMA Yonas Bunda MD ELECTRONICALLY SIGNED 09/08/2012 13:45

## 2014-08-04 NOTE — Consult Note (Signed)
PATIENT NAME:  Susan [name], BEED I MR#:  [id number] DATE OF BIRTH:  [birth year]  DATE OF CONSULTATION:  08/24/2012  CONSULTING PHYSICIAN:  Joselyn Arrow, NP  PRIMARY CARE PHYSICIAN:  Dr.  [name]   PRIMARY GASTROENTEROLOGIST:  Dr. Mechele Collin   ATTENDING GASTROENTEROLOGIST:  Dr. Midge Minium  REQUESTING PHYSICIAN:  Dr. Alford Highland  REASON FOR CONSULTATION:  Diverticular bleed.  HISTORY OF PRESENT ILLNESS:  Susan [name] is a pleasant, 81 year old female who was in her usual state of health when she noticed a large amount of bright red bleeding this morning, followed by large blood clots. She describes the bleeding as burgundy. She says she had a scant amount of bleeding that started yesterday. At 8:00 a.m., her symptoms began to become more severe, and then later in the day she decided to come to the ER. Her hemoglobin was 10.7. She denies any abdominal pain, nausea, vomiting, heartburn or indigestion. She does take an occasional TUMS as needed. She has had chronic constipation. She denies any NSAID use. She was sent for a bleeding scan, which was positive in the sigmoid colon region. Her last diverticular bleed was in December 2010. Last colonoscopy was 08/12/2007 by Dr. Mechele Collin, where she had pancolonic diverticulosis and small hemorrhoids. Her weight has remained stable. Her appetite has been fine prior to admission.   PAST MEDICAL AND SURGICAL HISTORY:  Recurrent diverticular bleeding requiring transfusions, last bleed in 2000, last colonoscopy 08/12/2007 by Dr. Mechele Collin, pancolonic diverticulosis, hemorrhoids, iron deficiency anemia, hypertension, diabetes mellitus, hyperlipidemia, TIA,  tonsillectomy, left inguinal herniorrhaphy, and atopic pregnancy.   MEDICATIONS PRIOR TO ADMISSION:  Amlodipine 10 mg daily, aspirin 81 mg daily, calcium/vitamin D, 600/200 mg daily, Cipro daily, glipizide 2.5 mg extended release daily, Lipitor 20 mg daily, metformin 500 mg b.i.d., multivitamin daily, oxybutynin 10 mg  extended release daily, protonix 40 mg daily, vitamin C 1 tablet daily.   ALLERGIES: No known drug allergies.  FAMILY HISTORY:   There is no family history of colorectal carcnoma, liver or chronic GI problems. One daughter with chronic kidney disease and congestive heart failure. One brother alive, and one sister with history of Alzheimer's.  SOCIAL HISTORY:  She lives alone. She is a widow. She has 5 children. She is retired. She denies any alcohol or drug use. She quit smoking about 50 years ago.   REVIEW OF SYSTEMS:  See HPI.  Neuro:  History of dizziness, otherwise negative complete review of systems.  PHYSICAL EXAMINATION:   VITAL SIGNS:  Temp 98.6, pulse 82, respirations 18, blood pressure 117/82. GENERAL:  She is a pleasant, black female, who is alert, oriented, pleasant, cooperative, in no acute distress.  HEENT:  Sclerae clear, nonicteric. Conjunctivae pink. Oropharynx pink and moist, without any lesions.  NECK:  Supple, without any masses or thyromegaly. HEART:  Regular rate and rhythm. Normal S1, S2, without any murmurs, clicks, rubs or gallops. LUNGS:  Clear to auscultation bilaterally.  ABDOMEN:  Positive bowel sounds x 4. No bruits auscultated. Abdomen is soft, nontender, nondistended, without palpable mass or hepatosplenomegaly. No rebound tenderness or guarding.  EXTREMITIES:  Without clubbing or edema.  SKIN:  Warm and dry, without any rash or jaundice. PSYCHIATRIC: She is cooperative, alert and oriented, with appropriate behavior.   LABORATORY STUDIES:  Glucose 179, otherwise normal BMP, lipase 62. LFTs normal, except total protein 8.3. White blood cell count 6.2, hematocrit 30.7, MCV 82, platelets 233.   IMPRESSION:  Susan [name] is a vibrant, 81 year old female admitted with large volume  rectal bleeding, with bleeding scan positive in the sigmoid colon. Her current hemoglobin is 10.7. She likely has a diverticular bleed from her sigmoid colon. This will be her third  hospitalization for this. Her last colonoscopy was in 2009 by Dr. Mechele CollinElliott, and it showed pancolonic diverticulosis. Her last episode of diverticular bleeding requiring transfusion was in 2010. I discussed her case with Dr. Servando SnareWohl and Dr. Gilda CreaseSchnier. Dr. Gilda CreaseSchnier will evaluate the patient tonight and further direct management, if he feels patient is appropriate for embolization. Other options include consideration of left hemicolectomy versus continued supportive measures, if patient does not want to proceed with definitive surgical intervention at this time.   PLAN: 1.  Consult Dr. Gilda CreaseSchnier. 2.  Agree with IV fluids, q. 6 H and H.  3.  Bedrest.  We would like to thank Dr. Renae GlossWieting for allowing us to participate in the care of Susan [name].     ____________________________ Joselyn ArrowKandice L. Eyan Hagood, NP klj:mr D: 08/24/2012 18:33:24 ET T: 08/24/2012 19:03:32 ET JOB#: [id number]  cc: Joselyn ArrowKandice L. Eyad Rochford, NP, <Dictator> Sarah "Sallie" Allena KatzPatel, MD  Joselyn ArrowKANDICE L Twana Wileman FNP ELECTRONICALLY SIGNED 08/27/2012 11:47

## 2014-08-04 NOTE — Consult Note (Signed)
Present Illness This is an 81 year old female who has history of diverticular bleed three times in the past. She presents to the ER today after passing dark clots per rectum, today  she had multiple episodes of clots and she felt dizzy and decided to come in for further evaluation. In the ER, she was still passing clots. She is currently hemodynamically stable and her hemoglobin in the ER was 10.2.   PAST MEDICAL HISTORY: Hypertension, diabetes, hyperlipidemia, diverticular bleeding, TIA, bladder incontinence.   PAST SURGICAL HISTORY: Tonsillectomy, ectopic pregnancy with 1 of her ovaries removed and part of her uterus removed, and a hernia repair.   Home Medications: Medication Instructions Status  Calcium 600+D 600 mg-200 units oral tablet 1 tab(s) orally once a day (at bedtime) Active  glipiZIDE 2.5 mg oral tablet, extended release 1 tab(s) orally once a day Active  Lipitor 20 mg oral tablet 1 tab(s) orally once a day (at bedtime) Active  metFORMIN 500 mg oral tablet 1 tab(s) orally 2 times a day Active  amLODIPine 10 mg oral tablet 1 tab(s) orally once a day (at bedtime) Active  Vitamin C 1 tab(s) orally once a day Active  quinapril 40 mg oral tablet 1 tab(s) orally 2 times a day Active  Aspirin Low Dose 81 mg oral delayed release tablet 1 tab(s) orally once a day Active  oxybutynin 10 mg/24 hr oral tablet, extended release 1 tab(s) orally once a day Active  multivitamin 1 tab(s) orally once a day Active  ciprofloxacin 250 mg oral tablet 1 tab(s) orally once a day for UTI prophylaxis Active    Naproxen: Bleeding  Atenolol: Itching  Case History:  Family History Non-Contributory   Social History negative tobacco, negative ETOH, negative Illicit drugs   Review of Systems:  Fever/Chills No   Cough No   Sputum No   Abdominal Pain No   Diarrhea No   Constipation No   Nausea/Vomiting No   SOB/DOE No   Chest Pain No   Telemetry Reviewed NSR   Dysuria No   Physical  Exam:  GEN well developed, well nourished, no acute distress   HEENT hearing intact to voice, moist oral mucosa   NECK supple  trachea midline   RESP normal resp effort  no use of accessory muscles   CARD regular rate  no JVD   ABD denies tenderness  nondistended   EXTR negative cyanosis/clubbing, negative edema   SKIN No rashes, No ulcers   NEURO cranial nerves intact, follows commands   Nursing/Ancillary Notes: **Vital Signs.:   13-May-14 17:30  Vital Signs Type Routine  Temperature Temperature (F) 98.1  Celsius 36.7  Temperature Source oral  Pulse Pulse 80  Respirations Respirations 28  Systolic BP Systolic BP 761  Diastolic BP (mmHg) Diastolic BP (mmHg) 63  Mean BP 87  Pulse Ox % Pulse Ox % 100  Pulse Ox Activity Level  At rest  Oxygen Delivery Room Air/ 21 %  Pulse Ox Heart Rate 80   Hepatic:  13-May-14 11:45   Bilirubin, Total 0.3  Alkaline Phosphatase 59  SGPT (ALT) 23  SGOT (AST) 31  Total Protein, Serum  8.3  Albumin, Serum 3.6  Routine BB:  13-May-14 12:03   Crossmatch Unit 1 Ready  Crossmatch Unit 2 Issued (Result(s) reported on 24 Aug 2012 at 08:06PM.)  ABO Group + Rh Type O Positive  Antibody Screen NEGATIVE (Result(s) reported on 24 Aug 2012 at 01:47PM.)  Routine Chem:  13-May-14 11:45   Glucose,  Serum  179  BUN 17  Creatinine (comp) 0.80  Sodium, Serum 137  Potassium, Serum 4.0  Chloride, Serum 104  CO2, Serum 30  Calcium (Total), Serum 9.4  Osmolality (calc) 280  eGFR (African American) >60  eGFR (Non-African American) >60 (eGFR values <56m/min/1.73 m2 may be an indication of chronic kidney disease (CKD). Calculated eGFR is useful in patients with stable renal function. The eGFR calculation will not be reliable in acutely ill patients when serum creatinine is changing rapidly. It is not useful in  patients on dialysis. The eGFR calculation may not be applicable to patients at the low and high extremes of body sizes,  pregnant women, and vegetarians.)  Anion Gap  3  Lipase  62 (Result(s) reported on 24 Aug 2012 at 12:16PM.)  Routine UA:  13-May-14 12:03   Color (UA) Yellow  Clarity (UA) Clear  Glucose (UA) 50 mg/dL  Bilirubin (UA) Negative  Ketones (UA) Negative  Specific Gravity (UA) 1.017  Blood (UA) Negative  pH (UA) 7.0  Protein (UA) Negative  Nitrite (UA) Negative  Leukocyte Esterase (UA) Trace (Result(s) reported on 24 Aug 2012 at 12:35PM.)  RBC (UA) <1 /HPF  WBC (UA) 2 /HPF  Bacteria (UA) TRACE  Epithelial Cells (UA) 1 /HPF  Mucous (UA) PRESENT (Result(s) reported on 24 Aug 2012 at 12:35PM.)  Routine Hem:  13-May-14 11:45   Hemoglobin (CBC)  10.2  WBC (CBC) 6.2  RBC (CBC)  3.74  Hematocrit (CBC)  30.7  Platelet Count (CBC) 233 (Result(s) reported on 24 Aug 2012 at 12:14PM.)  MCV 82  MCH 27.1  MCHC 33.1  RDW  14.6    17:33   Hemoglobin (CBC)  8.4 (Result(s) reported on 24 Aug 2012 at 05:48PM.)   Nuclear Med:    13-May-14 15:39, GI Blood Loss Study - Nuc Med  GI Blood Loss Study - Nuc Med   REASON FOR EXAM:    rectal bleeding  COMMENTS:       PROCEDURE: NM  - NM GI BLOOD LOSS STUDY  - Aug 24 2012  3:39PM     RESULT: The patient received an injection of 3.0 mL of PYP and 21.89 mCi   of technetium 99 M pertechnetate. Anterior acquisition was performed for   a total of one hour of the lateral view performed at the end of that time.    Accumulation of activity in the urinary bladder limits visualization of   the lower pelvic region. A post void series of images was performed and   showed some increased localization in the midline from right pelvic   region which was not present on the initial images. The lateral view   shows some increased localization of the rectosigmoid region.  IMPRESSION:  Findings concerning for active gastrointestinal hemorrhage   possibly in the pelvic region, presumably in the sigmoid colon region.    Dictation Site: 2(*)        Verified By:  GSundra Aland M.D., MD    Impression 1.  GI bleed/diverticular bleed            agree with admit to ICU and with resuscitation            transfuse            if bleeding does not subside will plan embolization 2.  Hypertension. Blood pressure initially elevated. Will hold the lisinopril for right now, continue the amlodipine only. Depending on how things go with her bleeding and where her hemoglobin  goes, blood pressure may go down.  3.  Diabetes. Will hold diabetic medications and just use sliding scale at this point.  4.  Hyperlipidemia. We will continue statin.  5.  History of transient ischemic attack. We will hold the aspirin.  6.  Urinary incontinence. Will continue the oxybutynin.   Electronic Signatures: Hortencia Pilar (MD)  (Signed 13-May-14 22:54)  Authored: General Aspect/Present Illness, Home Medications, Allergies, History and Physical Exam, Vital Signs, Labs, Radiology, Impression/Plan   Last Updated: 13-May-14 22:54 by Hortencia Pilar (MD)

## 2014-08-04 NOTE — H&P (Signed)
PATIENT NAME:  Susan [name], Susan [name] I MR#:  [id number] DATE OF BIRTH:  [birth year]  DATE OF ADMISSION:  12/14/2012  PRIMARY CARE PHYSICIAN: Judson Roch "Baltazar Apo, MD REFERRING PHYSICIAN:  Dr. Karma Greaser   HISTORY OF PRESENT ILLNESS:  This is an 81 year [name] African American female with past medical history of hypertension, diabetes, hyperlipidemia, recent diverticular bleed who is presenting with painless bright red blood per rectum 4 times on the day of admission. She had passing blood clots followed by frank red blood which prompted her to present to the Emergency Department. Despite her bleeding, she denies any abdominal pain, shortness of breath, chest pain or lightheadedness.  In the Emergency Department, she passed another large bloody bowel movement without any further associated symptoms. Vital signs remained stable. Initial hemoglobin of 11.  Her last stay at [facility] in May 2014 for similar presentation, she underwent sigmoid IMA embolization with resolution of symptoms.   REVIEW OF SYSTEMS: CONSTITUTIONAL: Denies any fevers, chills, weight changes.  EYES: Denies any vision changes or eye pain.  EARS, NOSE, THROAT, MOUTH: Denies any dysphagia, odynophagia.  CARDIOVASCULAR: Denies any chest pain, palpitations.  RESPIRATORY: Denies any shortness of breath or cough.  GASTROINTESTINAL: Bloody bowel movement as above, denies any nausea.  GENITOURINARY: Urinary incontinence at baseline, denies any dysuria.  MUSCULOSKELETAL: Denies any pain or weakness.  SKIN: Denies any rashes or lesions.  NEUROLOGICAL: Denies any paralysis or paresthesias.  PSYCHIATRIC: Denies any anxiety or depression.  ENDOCRINE: Denies any thyroid problems or nocturia.  HEMATOLOGIC/LYMPHATIC: Denies easy bruisability or easy bleeding, however, bleeding as mentioned above.  ALLERGY/IMMUNOLOGY: No active symptoms.  Otherwise, full review of systems performed by me is negative.   PAST MEDICAL HISTORY:  Hypertension, diabetes, hyperlipidemia, recent diverticular bleed.  ALLERGIES: ATENOLOL AND NAPROXEN.   SOCIAL HISTORY: Lives alone in a senior citizen apartment.  Denies any alcohol, tobacco or drug usage.   FAMILY HISTORY: Father died of atherosclerosis and [name] age. No mention of GI bleeding or GI cancer.   Home Meds: norvasc 19m PO daily Calcium 600+D 200units at night glipizide 2.538mdaily lipitor 2046mt night metformin 500m12mD multivitamin 1 tab daily oxybutynin 10mg82mly quinapril 40mg 59mvitamin c 1 tab daily  PHYSICAL EXAMINATION:  VITAL SIGNS: Temperature 99.2, heart rate 86, respirations 18, blood pressure 125/67, saturating 99% on room air.  GENERAL: No acute distress, awake, alert and oriented x 3.  HEENT: Normocephalic, atraumatic. Extraocular muscles are intact. Pupils are equal, round and reactive to light as well as accommodation. Moist mucosal membranes.  CARDIOVASCULAR: S1, S2, regular rate and rhythm. No murmurs, rubs or gallops.  PULMONARY: Clear to auscultation bilaterally without wheezes, rales or rhonchi.  ABDOMEN: Soft, nontender, nondistended with hyperactive bowel sounds.  EXTREMITIES: Reveal no cyanosis, edema or clubbing.  NEUROLOGIC: Cranial nerves II through XII are intact. No gross neurological deficits.  SKIN: No rashes or lesions noted.   LABORATORY DATA: Sodium 137, potassium 4.3, chloride is 105, bicarb 25, BUN 17, creatinine 0.8, total protein 8.8, albumin 3.5, bilirubin 0.1, alk phos 66, ALT 24, AST 34. Hemoglobin 11.5, hematocrit 34.5, platelets 247.   ASSESSMENT AND PLAN: This is an Susan [name] yea60old African-American female with past medical history of hypertension, hyperlipidemia, diabetes, recent diverticular bleed which required embolization back in May 2014 who is presenting with bright red blood per rectum which is  painless.   1.  Painless bright red blood per rectum: Currently, vital signs are stable with a stable hemoglobin and  hematocrit.  We  will trend H and H q. 6 hours with transfusion for hemoglobin less than 7. Type and cross 2 units with 2 units on hold. IV fluid hydration with normal saline.  Consult GI. The patient is made n.p.o.  2.  Diabetes:  Hold oral agents, place on an insulin sliding scale. 3.  Hypertension:  Hold ACE inhibitor. Continue Norvasc for now.  Can use hydralazine as needed for a systolic blood pressure greater than 180.  4.  Hyperlipidemia:  Continue statin therapy.  5.  Urinary incontinence:  Continue with oxybutynin.  CODE STATUS:  The patient is a FULL CODE.    TOTAL TIME SPENT:  33 minutes.   ____________________________ Aaron Mose. Hower, MD dkh:cb D: 12/14/2012 21:04:14 ET T: 12/14/2012 21:35:12 ET JOB#: [id number]  cc: Aaron Mose. Hower, MD, <Dictator> DAVID Woodfin Ganja MD ELECTRONICALLY SIGNED 12/14/2012 22:17

## 2014-08-04 NOTE — Discharge Summary (Signed)
PATIENT NAME:  Susan [name], Susan [name] MR#:  [id number] DATE OF BIRTH:  [birth year]  DATE OF ADMISSION:  08/24/2012 DATE OF DISCHARGE:  08/30/2012  ADDENDUM:    We were planning to discharge the patient 08/30/2012; however, she did not bowel movements for a while, so we ordered for her Dulcolax suppository. However, as the suppository was placed, the patient small amount of dark blood coming out from her rectum.  We were concerned about possibility of recurrence of her bleeding and decided to keep her until (Dictation Anomaly) the next day, for 24 more hours.  During this period of time, until 08/30/2012, the patient did satisfactory. Did not have any more bleeding. She did advance diet to soft diet and her hemoglobin level was rechecked on 08/30/2012 and  it was found to be even better than it was the day before.  It was  9.4 on the day of discharge 08/30/2012, being. 9.6 on 08/29/2012.  It was felt that the patient's  small amount of was very likely old and was sitting in the rectum until it was expelled, but we did not feel that it was any, acute new bleeding. [name] felt that she was ready to be discharged.   MEDICATIONS: Her discharge medications are as follows:  The patient is to resume calcium with vitamin D 600/200 one tablet once daily, glipizide 2.5 mg p.o. once daily, Lipitor 20 mg p.o. at bedtime, metformin 500 mg p.o. twice daily, amlodipine 10 mg p.o. at bedtime, vitamin C unknown dose 1 daily, quinapril 40 mg p.o. twice daily, oxybutynin extended release 10 mg once daily, multivitamins once daily, Colace 100 mg p.o. twice daily and iron sulfate 325 mg p.o. twice daily.   DISCHARGE INSTRUCTIONS:  The patient is to continue mechanical soft diet and to be advanced to regular diet over the next one week. She is to follow up with Dr. Hillery AldoSarah Patel  in two days after discharge and have her hemoglobin A1c checked in the next few days after discharge to make sure that it stable. She is to return back to the hospital if  she is rebleeding. The patient's vital signs on the day of discharge, 08/30/2012, temperature 98.4, pulse 75, respiratory rate was 18, blood pressure 131/63, saturation was 99% on room air at rest.   TIME SPENT: 40 minutes   ____________________________ Katharina Caperima Chanetta Moosman, MD rv:cc D: 08/30/2012 19:35:41 ET T: 08/31/2012 00:08:44 ET JOB#: [id number]  cc: Katharina Caperima Tiffine Henigan, MD, <Dictator> Erielle Gawronski MD ELECTRONICALLY SIGNED 09/14/2012 21:17

## 2014-08-04 NOTE — Op Note (Signed)
PATIENT NAME:  Susan [name], Susan [name] MR#:  [id number] DATE OF BIRTH:  1926/06/17  DATE OF PROCEDURE:  08/27/2012  PREOPERATIVE DIAGNOSES: 1.  Gastrointestinal bleeding with acute blood loss anemia.  2.  Diverticulosis.   3.  Hypertension.  4.  Diabetes.  POSTOPERATIVE DIAGNOSES: 1.  Gastrointestinal bleeding with acute blood loss anemia.  2.  Diverticulosis.   3.  Hypertension.  4.  Diabetes.   PROCEDURES: 1.  Catheter placement into secondary sigmoidal branch of inferior mesenteric artery.  2.  Aortogram and selective IMA arteriogram.  3.  Microbead embolization with approximately 1.75 mL of 500 and 700 micron polyvinyl alcohol beads.  4.  StarClose right femoral artery.   SURGEON:  Annice NeedyJason S. Michio Thier, M.D.   ANESTHESIA:  Local with moderate conscious sedation.   ESTIMATED BLOOD LOSS:  Minimal.   FLUOROSCOPY TIME:  Approximately 2 minutes.   CONTRAST USED:  35 mL of contrast were used.   INDICATION FOR PROCEDURE:  An 81 year old African American female who was admitted a few days prior with GI bleeding.  This has been intermittent, but has now become brisk and she has dropped her hemoglobin despite multiple units of blood transfusion.  Hemoglobin is now 8.4 and she has active ongoing bleeding.  A bleeding scan was positive for the sigmoid colon.  She is brought down for angiography and embolization of this area.  Risks and benefits were discussed.  Informed consent was obtained.   DESCRIPTION OF PROCEDURE:  The patient is brought to the vascular and interventional radiology suite.  Groins were shaved and prepped and a sterile surgical field was created.  The right femoral head was visualized with fluoroscopy.  The right femoral artery was then accessed without difficulty with a Seldinger needle.  A J-wire and 5-French sheath were placed.  Pigtail catheter was placed into the aorta and RAO projection aortogram was performed.  This showed a normal origin of the IMA.  This was then cannulated with a  VS-1 catheter and selective IMA arteriogram was then performed.  There were two branches one larger and one smaller going into the sigmoid colon, used a prograde microcatheter and easily cannulated the larger of the two branches and parked the catheter well out into the secondary sigmoidal branch, 1 mL of 500 and 700 micron polyvinyl alcohol beads was then deployed in this branch.  [name] then pulled the catheter back to the main IMA just into a primary IMA branch just above the bifurcation of the 2 sigmoid vessels and another 0.75 mL of the polyvinyl alcohol beads were deployed.  Completion angiogram following this showed the large vessels to remain patent with less brisk flow to the sigmoid colon consistent with successful embolization.  At this point, [name] elected to terminate the procedure.  The diagnostic catheter was removed.  Oblique arteriogram was performed of the right femoral artery and StarClose closure device deployed in the usual fashion with excellent hemostatic result.  The patient tolerated the procedure well and was taken to the recovery room in stable condition.      ____________________________ Annice NeedyJason S. Sullivan Blasing, MD jsd:ea D: 08/27/2012 17:04:02 ET T: 08/28/2012 02:18:25 ET JOB#: [id number]  cc: Annice NeedyJason S. Prinston Kynard, MD, <Dictator> Annice NeedyJASON S Tereka Thorley MD ELECTRONICALLY SIGNED 09/08/2012 13:49

## 2014-08-04 NOTE — Consult Note (Signed)
Brief Consult Note: Diagnosis: recurrent diverticular bleed,  previous embolization sigmoidal bleed May 2014., no clinical signs of continued bleeding.   Patient was seen by consultant.   Recommend further assessment or treatment.   Comments: would need to rebleed and have localization as to side of colon prior to any intervention.  Suggest 4 unit limit in this patient.  explained that repeat embolization associated with high chance of ischemic colitis.  Electronic Signatures: Natale LayBird, Dmarcus Decicco (MD)  (Signed 03-Sep-14 18:11)  Authored: Brief Consult Note   Last Updated: 03-Sep-14 18:11 by Natale LayBird, Homer Pfeifer (MD)

## 2014-08-04 NOTE — Consult Note (Signed)
PATIENT NAME:  Susan [name], Susan [name] MR#:  [id number] DATE OF BIRTH:  Mar 29, 1927  GASTROENTEROLOGY CONSULTATION REPORT  Dictated for Lutricia FeilPaul Oh, MD  DATE OF CONSULTATION:  12/15/2012  ATTENDING PHYSICIAN:  Dr. Clint GuyHower.  PRIMARY CARE PHYSICIAN: Hillery AldoSarah Patel, MD  CONSULTANT: [facility], as well as Dr. Lutricia FeilPaul Oh.  VASCULAR SURGEON: Dr. Festus BarrenJason Dew.   REASON FOR CONSULTATION: Bright red blood per rectum, known history of diverticular bleed with embolization performed, May 2014.   Susan [name] is an 81 year old African American female who has a known history of hypertension, diabetes, hyperlipidemia, recent diverticular bleeding, May 2014, which did require a sigmoid IMA embolization performed by Dr. Festus BarrenJason Dew. She presented to the Emergency Room yesterday with painless bright red blood per rectum 4 times on the day of admission. Last occurrence of bleeding was at approximately 10 o'clock this morning. States that it was dark, maroon-colored blood at that time. Initially it was a frank bright red blood with passage of clots, small passage of clots as well this morning. She has had no abdominal pain. No shortness of breath. No chest pain or lightheadedness.   In the Emergency Room yesterday she passed another large bloody bowel movement without any further associated symptoms. On admission, her hemoglobin was 11.5, with a hematocrit of 34.5. In trending, hemoglobin dropped to 9.9 and then down to 9.0 this morning at 4:36. The patient has not received any blood transfusions during her hospitalization. She has been typed and crossed over 2 units of blood. The patient states that she is doing very well. She continues not to experience any abdominal pain. During the interviewing process the patient was being transported to nuclear medicine  to have a bleeding scan performed.   Most recent colonoscopy was performed by Dr. Lynnae Prudeobert Elliott, 2009, with findings of multiple small- and large-mouthed diverticula found in the  sigmoid colon, descending colon, transverse colon, ascending colon, non-bleeding small internal hemorrhoids. This colonoscopy was done 08/12/2007. Colonoscopy on 05/07/2004 revealed one colonic polyp in the ascending colon, diverticulosis, and internal hemorrhoids.   PAST MEDICAL HISTORY: Diabetes mellitus type 2, hypertension, hyperlipidemia, diverticulosis with diverticular bleed May 2014, requiring embolization.   SURGICAL: Hernia repair, ectopic pregnancy, tonsillectomy, colonoscopy in 2006 and 2009.   FAMILY HISTORY: Noncontributory.   SOCIAL HISTORY: No tobacco. No alcohol use.   HOME MEDICATIONS: Amlodipine 10 mg a day, calcium 600 with vitamin D, 1 tablet once a day; glipizide 2.5 mg once a day, Lipitor 20 mg once a day, metformin 500 mg 1 tablet twice a day, multivitamin once a day, oxybutynin 10 mg/24-hour oral tablet once a day, quinapril 40 mg 1 tablet twice a day, vitamin C.   ALLERGIES: ATENOLOL and NAPROXEN.   REVIEW OF SYSTEMS:  CONSTITUTIONAL: Denies any fevers or chills, weight changes.  EYES: No blurred vision, double vision.  EARS, NOSE AND THROAT: No dysphagia or odynophagia.  CARDIOVASCULAR: No chest pain or palpitation.  RESPIRATORY: No shortness of breath or cough.  GASTROINTESTINAL: See history of present illness.  GENITOURINARY: Significant for urinary incontinence as a baseline. No dysuria.  MUSCULOSKELETAL: No neuralgias or myalgias.  SKIN: No rashes. No lesions.  NEUROLOGICAL: No history of CVA, seizure disorder.  PSYCHIATRIC: No depression. No anxiety.  ENDOCRINE: No thyroid problems or nocturia.  HEMATOLOGIC/LYMPHATIC: Denies significant easy bruising and bleeding, but significant for bleeding from a GI source at this time.  ALLERGIES: No active symptoms.   PHYSICAL EXAMINATION:  Temperature is 97.6, with a pulse of 66, respirations  are 18, blood pressure 122/64, with a pulse ox of 98% on room air.   GENERAL: Well-developed, slender 81 year old [place]  American female, no acute distress, very pleasant, active, moving with ease.  HEENT: Normocephalic, atraumatic. Pupils equal and reactive to light. Conjunctivae clear, pale.  NECK: Supple. Trachea midline. No lymphadenopathy or thyromegaly.  PULMONARY: Symmetric rise and fall of chest. Clear to auscultation throughout.  CARDIOVASCULAR: Regular rate and rhythm, S1, S2. No murmurs, no gallops.  ABDOMEN: Soft, nondistended. Bowel sounds slightly hypoactive, present though in all  4 quadrants. No evidence of hepatosplenomegaly.  RECTAL: Deferred.  MUSCULOSKELETAL: Moving all 4 extremities. No contractures or clubbing.  EXTREMITIES: No edema.  PSYCHIATRIC: Alert and oriented x 4. Memory grossly intact. Appropriate affect and mood.  NEUROLOGICAL: No gross neurological deficits.   LABORATORIES/DIAGNOSTICS: Hemoglobin and hematocrit results as stated in history section.   Chemistry panel on admission: Glucose was 223, otherwise within normal limits.   Hepatic panel: Total protein was 8.8, with a total bili of 0.1, otherwise within normal limits.   PT 13.5 with an INR of 1.0, PTT of 37.4.   IMPRESSION: Lower GI bleed, suspect diverticular in nature given her past medical history;  anemia secondary to lower GI bleed, diabetes mellitus.   PLAN: The patient's presentation was discussed Dr. Lutricia Feil pending results of GI blood loss source. If active source, locations know to recommend vascular surgery consultation. We will continue to monitor blood count at this time as well as hemodynamic status.   Transfuse as necessary.    ____________________________ Rodman Key, NP dsh:dm D: 12/15/2012 13:18:02 ET T: 12/15/2012 13:49:43 ET JOB#: [id number]  cc: Rodman Key, NP, <Dictator> Rodman Key MD ELECTRONICALLY SIGNED 12/16/2012 14:35

## 2014-08-04 NOTE — Discharge Summary (Signed)
PATIENT NAME:  Susan [name], [name] MR#:  [id number] DATE OF BIRTH:  09-Mar-1927  DATE OF ADMISSION:  12/14/2012 DATE OF DISCHARGE:  12/24/2012  FINAL DIAGNOSES:  1.  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  2.  Type 2 diabetes.  3.  Hypertension.  4.  Hyperlipidemia.   PRINCIPAL PROCEDURES:  1.  Extended left hemicolectomy.  2.  Two-unit blood transfusion 3.  Nuclear medicine tagged red cell scan. 4.  Colonoscopy 5.  GI consult 6. Medicine consult 7.  Surgical consult.  CONSULTATIONS:   1.  GI medicine consultation.  2.  Surgical consultation.  3.  Case management consultation.  4.  Physical therapy consultation.  HOSPITAL COURSE SUMMARY: The patient was admitted with lower GI bleeding, which was painless and recurrent. Of note, the patient was previously admitted within the last 12 months with embolization of the left-sided diverticular bleeding. Re-imaging demonstrated uptake within the left colon on a nuclear medicine bleeding scan. A colonoscopy was performed, which demonstrated fresh blood on the left side. The patient seemed to have stopped bleeding, however, continued to have persistent bright red blood per rectum and a drop in her hemoglobin requiring 2 units of blood transfusion. The decision was made for elective sigmoid colectomy, which was performed on the 7th of September. The patient had an unremarkable postoperative stay. She had passage of flatus and bowel movement. Her diet was able to be advanced. She was Entereg postoperatively. She had minimal pain. Due to her age and the fact that she lives by herself, she was deemed [facility], for which  arrangements have been made and transfer will be occurring on September 12.   DISCHARGE MEDICATIONS: 1.  Calcium 600 mg 1 tab p.o. daily.  2.  Glipizide 2.5 mg oral tablet extended-release once a day.  3.  Lipitor 20 mg by mouth once a day at bedtime.  4.   Metformin 500 mg by mouth b.i.d.  5.  Vitamin C 1 tab p.o. daily.  6.  Quinapril 40 mg by mouth b.i.d.  7.  Oxybutynin 10 mg extended-release tablet once a day by mouth. 8.  Multivitamin 1 tab by mouth daily.  9.  Tylenol 325 mg by mouth 1 tablet every 4 hours as needed for pain.  10.  Norco 5/325 1 tab every 6 hours as needed for pain.   DISCHARGE INSTRUCTIONS: The patient will follow up with me in the office on September 19 for remaining suture removal and follow up with me, second appointment, [facility]. She is to call with any questions or concerns.   ____________________________ Redge GainerMark A. Egbert GaribaldiBird, MD mab:jm D: 12/24/2012 11:48:42 ET T: 12/24/2012 17:49:37 ET JOB#: [id number]  cc: Loraine LericheMark A. Egbert GaribaldiBird, MD, <Dictator> Raynald KempMARK A Ericka Marcellus MD ELECTRONICALLY SIGNED 12/25/2012 14:14

## 2014-08-04 NOTE — H&P (Signed)
PATIENT NAME:  Susan [name], Susan [name] MR#:  [id number] DATE OF BIRTH:  [birth year]  DATE OF ADMISSION:  08/24/2012  PRIMARY CARE PHYSICIAN:  Susan [name].   CHIEF COMPLAINT: "[name] think [name] have diverticulosis again."   HISTORY OF PRESENT ILLNESS: This is an 81 year old female who has history of diverticulosis bleed in the past. She presents to the ER today after seeing a pinkish smear on bowel movement on Sunday, yesterday was passing some dark clots, today continuously passing dark red clots and she felt dizzy and decided to come in for further evaluation. In the ER, she was still passing clots. When she did get up to the toilet, it was quite a bit. She is currently hemodynamically stable and her hemoglobin in the ER was 10.2. Hospitalist services were contacted for further evaluation. Of note, the patient does take aspirin for a history of TIA.   PAST MEDICAL HISTORY: Hypertension, diabetes, hyperlipidemia, diverticular bleeding, TIA, bladder incontinence.   PAST SURGICAL HISTORY: Tonsillectomy, ectopic pregnancy with 1 of her ovaries removed and part of her uterus removed, and a hernia repair.   ALLERGIES: No known drug allergies.   MEDICATIONS: As per prescription writer include amlodipine 10 mg daily, aspirin 81 mg daily, calcium and vitamin D 600/200 daily, Cipro, glipizide 2.5 mg extended-release daily, Lipitor 20 mg at bedtime, metformin 500 mg twice a day, multivitamin daily, oxybutynin 10 mg extended-release daily, quinapril 40 mg twice a day, vitamin C 1 tablet daily.   SOCIAL HISTORY: Lives alone in senior citizen apartment. No smoking, alcohol or drug use. 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   FAMILY HISTORY: Father died of atherosclerosis and mother died of old age.   REVIEW OF SYSTEMS: CONSTITUTIONAL: Positive for a few-pound weight loss. No fever, chills or sweats. Positive for dizziness.  EYES: She does wear glasses.  EARS, NOSE, MOUTH AND THROAT: Positive for  dysphagia with pills. No sore throat. No difficulty hearing.  CARDIOVASCULAR: No chest pain. No palpitations.  RESPIRATORY: No shortness of breath. No coughing. No sputum. No hemoptysis.  GASTROINTESTINAL: No nausea. No vomiting. No abdominal pain. History of constipation and passing dark red blood clots.  GENITOURINARY: No burning on urination. No hematuria.  MUSCULOSKELETAL: No joint pain or muscle pain.  INTEGUMENT: No rashes or eruptions.  NEUROLOGIC: Positive for feeling dizzy and losing balance.  PSYCHIATRIC: No anxiety or depression.  ENDOCRINE: No thyroid problems.  HEMATOLOGIC AND LYMPHATIC: History of anemia.   PHYSICAL EXAMINATION: VITAL SIGNS: Temperature 98.6, pulse 82, respirations 18, blood pressure 171/82, pulse ox 99% on room air.  GENERAL: No respiratory distress.  EYES: Conjunctivae and lids normal. Pupils equal, round and reactive to light. Extraocular muscles intact. No nystagmus.  EARS, NOSE, MOUTH AND THROAT: Tympanic membranes no erythema. Nasal mucosa no erythema. Throat no erythema, no exudate seen. Lips and gums no lesions.  NECK: No JVD. No bruits. No lymphadenopathy. No thyromegaly. No thyroid nodules palpated.  RESPIRATORY:  Lungs clear to auscultation. No use of accessory muscles to breathe. No rhonchi, rales or wheeze heard.  CARDIOVASCULAR SYSTEM: S1, S2 normal. No gallops, rubs or murmurs heard. Carotid upstroke 2+ bilaterally. No bruits.   EXTREMITIES: Dorsalis pedis pulses 2+ bilaterally. No edema of the lower extremity.  ABDOMEN: Soft, nontender. No organomegaly/splenomegaly. Normoactive bowel sounds. No masses felt.  LYMPHATIC: No lymph nodes in the neck.  MUSCULOSKELETAL: No clubbing, edema or cyanosis.  SKIN: No rashes or lesions.  NEUROLOGIC: Cranial nerves II through XII grossly intact. Deep tendon  reflexes 2+ bilateral lower extremities.  PSYCHIATRIC: The patient is oriented to person, place and time.   LABORATORY AND RADIOLOGICAL DATA: Lipase  62, glucose 179, BUN 17, creatinine 0.80, sodium 137, potassium 4.0, chloride 104, CO2 30, calcium 9.4. LIVER FUNCTION TESTS: Total protein slightly elevated at 8.3, other liver function tests normal. White blood cell count 6.2, H and H 10.2 and 30.7, platelet count of 233. Urinalysis trace leukocyte esterase. EKG shows normal sinus rhythm, 94 beats per minute, right bundle branch block.   ASSESSMENT AND PLAN: 1.  Acute hemorrhagic anemia, gastrointestinal bleed, likely diverticular in nature. We will hold the patient's aspirin since the patient is continually passing blood clots. We will get a bleeding scan. We will check orthostatics, get serial hemoglobins. Start on clear liquid diet and give IV fluid hydration. Case discussed with Dr. Servando SnareWohl, gastroenterology, who will see the patient. [name] will monitor at least initially in the Intensive Care Unit. Further management depending on bleeding scan results. The patient was consented for blood just in case the patient needs a transfusion, benefits and risks explained. All questions answered.  2.  Hypertension. Blood pressure initially elevated. Will hold the lisinopril for right now, continue the amlodipine only. Depending on how things go with her bleeding and where her hemoglobin goes, blood pressure may go down.  3.  Diabetes. Will hold diabetic medications and just use sliding scale at this point.  4.  Hyperlipidemia. We will continue statin.  5.  History of transient ischemic attack. We will hold the aspirin.  6.  Urinary incontinence. Will continue the oxybutynin.   TIME SPENT ON ADMISSION: 55 minutes.   CODE STATUS: The patient is a full code.    ____________________________ Herschell Dimesichard J. Renae GlossWieting, MD rjw:cs D: 08/24/2012 15:15:11 ET T: 08/24/2012 15:33:45 ET JOB#: [id number]  cc: Herschell Dimesichard J. Renae GlossWieting, MD, <Dictator> Sarah "Sallie" Allena KatzPatel, MD  Salley ScarletICHARD J Karson Reede MD ELECTRONICALLY SIGNED 09/03/2012 13:28

## 2014-08-04 NOTE — Consult Note (Signed)
Chief Complaint:  Subjective/Chief Complaint Rectal bleeding persists. Bleeding scan repeated. This time, bleeding scan positive in descending colon.   VITAL SIGNS/ANCILLARY NOTES: **Vital Signs.:   04-Sep-14 14:54  Vital Signs Type Recheck  Pulse Pulse 81  Systolic BP Systolic BP 149  Diastolic BP (mmHg) Diastolic BP (mmHg) 67  Mean BP 94  Pulse Ox % Pulse Ox % 98   Brief Assessment:  GEN no acute distress   Cardiac Regular   Respiratory clear BS   Lab Results: Routine Hem:  04-Sep-14 13:56   Hemoglobin (CBC)  9.2 (Result(s) reported on 16 Dec 2012 at 02:19PM.)   Radiology Results: Nuclear Med:    04-Sep-14 12:09, GI Blood Loss Study - Nuc Med  GI Blood Loss Study - Nuc Med   REASON FOR EXAM:    neg scan today. had another bleeding episode just now   witnessed by me.   please  COMMENTS:       PROCEDURE: NM  - NM GI BLOOD LOSS STUDY  - Dec 16 2012 12:09PM     RESULT: The patient undergone GI bleeding study on 12/15/2012. Thepatient   was returned to the department for a short 10 minute dynamic acquisition   and a static study. Study shows increasing activity in the mid left   abdomen toward the left and extending toward the midline in a pattern   suggestive of colonic hemorrhage possibly in the area of the descending   colon into the sigmoid colon.    IMPRESSION:  Active gastrointestinal hemorrhage in the descending colon.  Dictation Site: 2(*)        Verified By: Elveria RoyalsGEOFFREY H. BROWNE, M.D., MD   Assessment/Plan:  Assessment/Plan:  Assessment LGI bleeding. Likely diverticular bleeding. Positive bleeding scan.   Plan Discussed doing colonoscopy with patient. It will tell us if bleeding still active. May not tells us location of bleeding. Cautery or clipping ONLY IF exact site of bleeding such as bleeding diverticulum found. Pt wants to proceed. Will schedule tomorrow. Thanks   Electronic Signatures: Lutricia Feilh, Icelynn Onken (MD)  (Signed 04-Sep-14 15:45)  Authored: Chief  Complaint, VITAL SIGNS/ANCILLARY NOTES, Brief Assessment, Lab Results, Radiology Results, Assessment/Plan   Last Updated: 04-Sep-14 15:45 by Lutricia Feilh, Jiayi Lengacher (MD)

## 2014-08-04 NOTE — Consult Note (Signed)
Please see colon report. Fresh blood throughout left side of colon but not on the right side. Numerous tics in sigmoid and many in descending colon. Tics present on right side of colon as well. Spent lavaging the whole colon. Cannot tell which of the tics is bleeding. Will let surgery know. If more bleeeding, then will surgical resection. Dr.Rein will follow over the weekend. Thanks.  Electronic Signatures: Lutricia Feilh, Muranda Coye (MD)  (Signed on 05-Sep-14 12:38)  Authored  Last Updated: 05-Sep-14 12:38 by Lutricia Feilh, Mailin Coglianese (MD)

## 2014-08-04 NOTE — Consult Note (Signed)
Brief Consult Note: Diagnosis: Diverticular Bleed.   Patient was seen by consultant.   Consult note dictated.   Recommend further assessment or treatment.   Discussed with Attending MD.   Comments: [name] is a very vibrant 86-y/o female admitted with large volume rectal bleeding with bleeding scan positive in sigmoid colon.  Hgb 10.7.  Likely diverticular bleeding from sigmoid.  This would be her 3rd hospitalization for this.  Last colonoscopy 2009 by Dr. Mechele [name] showed pancolinic diverticulosis.  Last episode of diverticular bleeding requiring transfusion was in 2010.  I discussed with Dr Susan [name] & Dr. Gilda [name].  Dr. Gilda [name] will evaluate patient tonight and further direct management if he feels pt is appropriate for embolization.  Other options include consideration of left hemicolectomy versus continued supportive measures if patient does not want to proceed with definitive surgical intervention at this time.  Plan: 1) Consult Dr Susan [name] 2) Agree w/ IVFs, q6 H/H 3) bedrest #[id number].  Electronic Signatures: Susan [name], Susan [name] (NP)  (Signed 13-May-14 18:26)  Authored: Brief Consult Note   Last Updated: 13-May-14 18:26 by Susan [name], Susan [name] (NP)

## 2014-08-04 NOTE — Consult Note (Signed)
Chief Complaint:  Subjective/Chief Complaint Pt denies any further bleeding.  Denies abdominl pain, nausea or vomiting.  Hgb stable.  "Ready to eat".   VITAL SIGNS/ANCILLARY NOTES: **Vital Signs.:   15-May-14 05:20  Vital Signs Type Routine  Temperature Temperature (F) 98  Celsius 36.6  Temperature Source oral  Pulse Pulse 67  Respirations Respirations 18  Systolic BP Systolic BP 225  Diastolic BP (mmHg) Diastolic BP (mmHg) 67  Mean BP 92  Pulse Ox % Pulse Ox % 98  Pulse Ox Activity Level  At rest  Oxygen Delivery Room Air/ 21 %   Brief Assessment:  Cardiac Regular  -- LE edema   Respiratory normal resp effort   Gastrointestinal Normal   Gastrointestinal details normal Soft  Nontender  Nondistended  Bowel sounds normal  No rebound tenderness  No gaurding   Additional Physical Exam GEN: A/Ox3, NAD Skin: W/D intact   Lab Results:  Routine Chem:  15-May-14 05:11   Glucose, Serum 76  BUN 13  Creatinine (comp)  0.59  Sodium, Serum 141  Potassium, Serum 3.6  Chloride, Serum  109  CO2, Serum 26  Calcium (Total), Serum 9.0  Anion Gap  6  Osmolality (calc) 280  eGFR (African American) >60  eGFR (Non-African American) >60 (eGFR values <40m/min/1.73 m2 may be an indication of chronic kidney disease (CKD). Calculated eGFR is useful in patients with stable renal function. The eGFR calculation will not be reliable in acutely ill patients when serum creatinine is changing rapidly. It is not useful in  patients on dialysis. The eGFR calculation may not be applicable to patients at the low and high extremes of body sizes, pregnant women, and vegetarians.)  Routine Hem:  15-May-14 05:11   WBC (CBC) 6.9  RBC (CBC)  3.31  Hemoglobin (CBC)  9.3  Hematocrit (CBC)  27.2  Platelet Count (CBC) 211  MCV 82  MCH 28.1  MCHC 34.2  RDW  14.9  Neutrophil % 60.6  Lymphocyte % 25.7  Monocyte % 8.3  Eosinophil % 4.3  Basophil % 1.1  Neutrophil # 4.2  Lymphocyte # 1.8  Monocyte  # 0.6  Eosinophil # 0.3  Basophil # 0.1 (Result(s) reported on 26 Aug 2012 at 05:47AM.)   Assessment/Plan:  Assessment/Plan:  Assessment Recurrent diverticular bleeding:  Self-limited.  No further bleeding.  Pt declined embolization with Dr. SDelana Meyer Anemia: Hgb stable.   Plan 1. Continue to monitor H/H 2. Clear liquid diet, advance as tolerated Will sign off, call if any questions or problems. Thank you for allowing [facility] participate in the care of Ms. GInocencio   Electronic Signatures: JAndria Meuse(NP)  (Signed 15-May-14 12:27)  Authored: Chief Complaint, VITAL SIGNS/ANCILLARY NOTES, Brief Assessment, Lab Results, Assessment/Plan   Last Updated: 15-May-14 12:27 by JAndria Meuse(NP)

## 2014-08-04 NOTE — Consult Note (Signed)
Pt seen and examined. Please see Dawn Harrison's notes.Just returned from bleeding scan which was neg. Last bloody BM this AM. Repeat bleeding scan if bleeding recurs again. Will follow. Thanks.  Electronic Signatures: Lutricia Feilh, Dantonio Justen (MD)  (Signed on 03-Sep-14 15:33)  Authored  Last Updated: 03-Sep-14 15:33 by Lutricia Feilh, Emmamarie Kluender (MD)

## 2014-08-04 NOTE — Op Note (Signed)
PATIENT NAME:  Susan [name], Susan [name] MR#:  [id number] DATE OF BIRTH:  01-08-1927  DATE OF PROCEDURE:  12/19/2012  PREOPERATIVE DIAGNOSIS: Recurrent sigmoid diverticular bleeding.   POSTOPERATIVE DIAGNOSIS:  Recurrent sigmoid diverticular bleeding.  PROCEDURE PERFORMED 1.  Extended left hemicolectomy.  2.  Takedown of splenic flexure.   SURGEON: Raynald KempMark A Tylyn Derwin, M.D.   ASSISTANT: Ignacia Bayleyerri Duncan, R.N.   TYPE OF ANESTHESIA: General oral endotracheal.   FINDINGS: Significant diverticulosis of the entire left colon, most concentrated in the left colon and sigmoid colon. There were diverticular changes throughout the colon. The remaining intra-abdominal contents were unremarkable. Delivery of splenic flexure was inadequate to achieve an anastomosis and as such an extended left hemicolectomy was performed.    SPECIMENS: Left colon and sigmoid colon to pathology.   ESTIMATED BLOOD LOSS: 100 mL.   DRAINS: None.   LAP AND NEEDLE COUNT: Correct x 2.   DESCRIPTION OF PROCEDURE: With informed consent, supine position, general oral endotracheal anesthesia was induced. A Foley catheter was placed under sterile technique. The patient was then padded and positioned in dorsal lithotomy. The perineum was prepped and draped with Betadine and the abdomen with ChloraPrep. Timeout was observed.   The previous lower midline skin incision was opened sharply. Musculofascial layers being divided with electrocautery and the fascia being incised with a scalpel and the peritoneum with Metzenbaum scissors. Finger sweep demonstrated no evidence of intra-abdominal adhesions. The length of the incision was taken with the electrocautery. A self-retaining abdominal retractor was placed. Inspection of the intra-abdominal contents demonstrated no acute findings. Small bowel was run in its entirety, found to be unremarkable. With the help of laparotomy tapes, small bowel was packed into the upper abdomen. The sigmoid colon was redundant,  taken down off the white line of Toldt with visualization of the left ureter. Just above the peritoneal reflection there appeared to be no evidence of diverticular disease. A window was fashioned at the base of the colon at this point and a contoured 40 stapler was used to transect through this area. The intervening mesentery was then sequentially scored, clamped, cut and tied with #1 Vicryl and also divided with the application of the LigaSure apparatus. Splenic flexure was likewise delivered in its entirety. At this point, the splenic flexure was noted to be very short along its vascular pedicle. As such, the transverse colon was separated off the omentum along the avascular plane and the right hepatic flexure was also delivered. Further resection of the intestine was created such that the point of the colon that would reach down into the pelvis was reached and this was found to be just to the left of the middle colic vessels. After division of all the mesentery with the LigaSure apparatus and clamps and ties of #0 Vicryl, the colon was amputated at this site with the GIA 75 stapler. The proximal colon was then dilated after passage of a 2-0 nylon suture with pursestring applicator. The colon  accepted up to a 29 mm EEA but this was rather tight.   A 25 mm EEA stapler was chosen. The anvil was placed. The Pursestring was secured. The dilator was then easily placed through the rectum up into the distal segment of the staple line without any undue tension and a coupled  anastomosis was fashioned with a 25 mm ILS stapler. Two intact thick donuts were identified. The anastomosis was checked for hermetic seal utilizing air insufflation under saline irrigation and proximal occlusion. Several seromuscular 3-0 silk sutures were  placed in the anterior portion of the anastomosis to imbricate it.   At this point, lap and needle count was correct x 2. The abdomen was copiously irrigated with warm normal saline and aspirated  dry, and hemostasis appeared to be adequate on the operative field as well as in the left upper quadrant. The abdominal midline fascia was closed in the extremes of the wound utilizing running #1 Vicryl sutures with interrupted #1 Vicryl sutures as internal retention sutures. Skin edges were reapproximated utilizing a skin stapler device. Sterile occlusive dressing was placed. The patient was then returned supine, extubated and taken to the recovery room in stable and satisfactory condition by anesthesia services.    ____________________________ Susan Gainer Susan Garibaldi, MD mab:cs D: 12/19/2012 18:19:25 ET T: 12/19/2012 19:50:35 ET JOB#: [id number]  cc: Susan Leriche A. Susan Garibaldi, MD, <Dictator> Susan Vasques A Tahnee Cifuentes MD ELECTRONICALLY SIGNED 12/21/2012 10:13

## 2014-08-04 NOTE — Consult Note (Signed)
Chief Complaint:  Subjective/Chief Complaint Pt denies any further bleeding this morning.  Denies abdominl pain, nausea or vomiting.  Hgb 8.6.   VITAL SIGNS/ANCILLARY NOTES: **Vital Signs.:   14-May-14 06:18  Temperature Temperature (F) 98  Celsius 36.6  Temperature Source oral  Pulse Pulse 60  Respirations Respirations 15  Systolic BP Systolic BP 453  Diastolic BP (mmHg) Diastolic BP (mmHg) 61  Mean BP 82  Pulse Ox % Pulse Ox % 100  Pulse Ox Activity Level  At rest  Oxygen Delivery Room Air/ 21 %  Pulse Ox Heart Rate 60   Brief Assessment:  Cardiac Regular  -- LE edema   Respiratory normal resp effort   Gastrointestinal Normal   Gastrointestinal details normal Soft  Nontender  Nondistended  Bowel sounds normal  No rebound tenderness  No gaurding   Additional Physical Exam GEN: A/Ox3, NAD Skin: W/D intact   Lab Results:  Routine Chem:  14-May-14 04:31   Glucose, Serum 97  BUN  19  Creatinine (comp) 0.67  Sodium, Serum 141  Potassium, Serum 3.7  Chloride, Serum  109  CO2, Serum 28  Calcium (Total), Serum 8.5  Anion Gap  4  Osmolality (calc) 283  eGFR (African American) >60  eGFR (Non-African American) >60 (eGFR values <86m/min/1.73 m2 may be an indication of chronic kidney disease (CKD). Calculated eGFR is useful in patients with stable renal function. The eGFR calculation will not be reliable in acutely ill patients when serum creatinine is changing rapidly. It is not useful in  patients on dialysis. The eGFR calculation may not be applicable to patients at the low and high extremes of body sizes, pregnant women, and vegetarians.)  Routine Hem:  14-May-14 04:31   Hemoglobin (CBC)  8.6  WBC (CBC) 7.2  RBC (CBC)  3.11  Hematocrit (CBC)  25.4  Platelet Count (CBC) 182  MCV 82  MCH 27.6  MCHC 33.8  RDW 14.3  Neutrophil % 64.5  Lymphocyte % 24.9  Monocyte % 8.8  Eosinophil % 1.1  Basophil % 0.7  Neutrophil # 4.7  Lymphocyte # 1.8  Monocyte # 0.6   Eosinophil # 0.1  Basophil # 0.1 (Result(s) reported on 25 Aug 2012 at 05:46AM.)    10:00   Hemoglobin (CBC)  9.1 (Result(s) reported on 25 Aug 2012 at 10:27AM.)   Assessment/Plan:  Assessment/Plan:  Assessment Recurrent diverticular bleeding:  Pt desires supportive measures, declining embolization with Dr. SDelana Meyerat this time. Anemia: transfusion dependant, secondary to GI bleed.   Plan 1. Continue to monitor H/H 2. Transfusion as necessary if pt becomes symptomatic 3. Appreciate input from Dr. SDelana Meyer  Electronic Signatures: JAndria Meuse(NP)  (Signed 14-May-14 11:14)  Authored: Chief Complaint, VITAL SIGNS/ANCILLARY NOTES, Brief Assessment, Lab Results, Assessment/Plan   Last Updated: 14-May-14 11:14 by JAndria Meuse(NP)

## 2014-08-04 NOTE — Consult Note (Signed)
Brief Consult Note: Diagnosis: Lower GI bleed, suspect diverticular in nature given her past medical history.  Anemia secondary to acute lower GI bleed.  Diabetes Mellitus.   Consult note dictated.   Discussed with Attending MD.   Comments: Patient's presentation discussed with Dr. Lutricia FeilPaul Oh.  Pending results of GI blood loss source.  If active source, location is noted recommend vascular surgery consultation.  Will continue to monitor blood count.  Transfuse as necessary.  Electronic Signatures: Rodman KeyHarrison, Debralee Braaksma S (NP)  (Signed 03-Sep-14 13:18)  Authored: Brief Consult Note   Last Updated: 03-Sep-14 13:18 by Rodman KeyHarrison, Senna Lape S (NP)

## 2014-11-07 ENCOUNTER — Other Ambulatory Visit: Payer: Self-pay | Admitting: Family Medicine

## 2014-11-07 DIAGNOSIS — M8580 Other specified disorders of bone density and structure, unspecified site: Secondary | ICD-10-CM

## 2014-11-16 ENCOUNTER — Ambulatory Visit
Admission: RE | Admit: 2014-11-16 | Discharge: 2014-11-16 | Disposition: A | Discharge status: Home / Self Care | Payer: Medicare Other | Source: Ambulatory Visit | Attending: Family Medicine | Admitting: Family Medicine

## 2014-11-16 DIAGNOSIS — M8580 Other specified disorders of bone density and structure, unspecified site: Secondary | ICD-10-CM

## 2014-11-16 DIAGNOSIS — M85861 Other specified disorders of bone density and structure, right lower leg: Secondary | ICD-10-CM | POA: Insufficient documentation

## 2014-11-16 DIAGNOSIS — M8588 Other specified disorders of bone density and structure, other site: Secondary | ICD-10-CM | POA: Diagnosis not present

## 2015-01-18 ENCOUNTER — Other Ambulatory Visit: Payer: Self-pay | Admitting: Family Medicine

## 2015-01-18 DIAGNOSIS — R1310 Dysphagia, unspecified: Secondary | ICD-10-CM

## 2015-01-22 ENCOUNTER — Ambulatory Visit
Admission: RE | Admit: 2015-01-22 | Discharge: 2015-01-22 | Disposition: A | Discharge status: Home / Self Care | Payer: Medicare Other | Source: Ambulatory Visit | Attending: Family Medicine | Admitting: Family Medicine

## 2015-01-22 DIAGNOSIS — R1310 Dysphagia, unspecified: Secondary | ICD-10-CM | POA: Diagnosis present

## 2015-01-22 DIAGNOSIS — R1313 Dysphagia, pharyngeal phase: Secondary | ICD-10-CM

## 2015-01-22 NOTE — Therapy (Signed)
[facility] DIAGNOSTIC RADIOLOGY [address] [place], [place], [address] Phone: [phone]   Fax:     Modified Barium Swallow  Patient Details  Name: Susan [name] MRN: [id number] Date of Birth: 04-02-1927 Referring Provider:  [name], MD  Encounter Date: 01/22/2015    No past medical history on file.  No past surgical history on file.  There were no vitals filed for this visit.  Visit Diagnosis: Pharyngeal dysphagia  Difficulty in swallowing - Plan: DG OP Swallowing Func-Medicare/Speech Path, DG OP Swallowing Func-Medicare/Speech Path   Subjective: Patient behavior: Patient is alert; she is able to relay her swallowing history and follow directions.  Chief complaint: Dysphagia for pills.   Objective:  Radiological Procedure: A videoflouroscopic evaluation of oral-preparatory, reflex initiation, and pharyngeal phases of the swallow was performed; as well as a screening of the upper esophageal phase.  I. POSTURE: Upright in MBS chair  II. VIEW: Lateral  III. COMPENSATORY STRATEGIES: N/A  IV. BOLUSES ADMINISTERED:   Thin Liquid: 2 small sips, 3 multiple sips   Nectar-thick Liquid: 1 sip    Puree: 2 teaspoon    Mechanical Soft: 1/4 graham cracker in applesauce   12.5 mm barium tablet, whole in apple sauce  V. RESULTS OF EVALUATION: A. ORAL PREPARATORY PHASE: (The lips, tongue, and velum are observed for strength and coordination) Within normal limits       **Overall Severity Rating: WNL  B. SWALLOW INITIATION/REFLEX: (The reflex is normal if "triggered" by the time the bolus reached the base of the tongue) within normal limits  **Overall Severity Rating: WNL  C. PHARYNGEAL PHASE: (Pharyngeal function is normal if the bolus shows rapid, smooth, and continuous transit through the pharynx and there is no pharyngeal residue after the swallow) Within normal limits  **Overall Severity Rating: WNL  D. LARYNGEAL  PENETRATION: (Material entering into the laryngeal inlet/vestibule but not aspirated) None  E. ASPIRATION: None  F. ESOPHAGEAL PHASE: (Screening of the upper esophagus) A 12.5 mm barium tablet became lodged in the cervical esophagus after moving directly through the pharynx and UES.  The tablet remained lodged for over 10 minutes, dissolving with hot liquid.    ASSESSMENT: This 81 year old woman; with difficulty swallowing pills (feels that they get stuck in her throat); is presenting with functional oropharyngeal swallowing.  Oral control of the bolus including oral hold, rotary mastication, and anterior to posterior transfer is within normal limits.  Timing of the pharyngeal swallow is within normal limits.  Pharyngeal aspects of the swallow including hyolaryngeal excursion, pharyngeal pressure generation, epiglottic inversion, duration/amplitude of UES opening, and laryngeal vestibule closure at the height of the swallow are within normal limits.  There is no observed pharyngeal residue, laryngeal penetration, or tracheal aspiration.  A 12.5 mm barium tablet became lodged in the cervical esophagus after moving directly through the pharynx and UES.  The tablet remained lodged for over 10 minutes, dissolving with hot liquid.  The patient is at risk for erosive esophagitis.  The patient was advised to crush her meds or use liquid form when possible.  The patient states that she has one medication that she cannot take with food.  I advised her to not take this pill until she has discussed alternatives with her MD.     The patient may benefit from evaluation of the upper esophagus and consideration of esophageal dilation.  PLAN/RECOMMENDATIONS:   A. Diet: Usual diet   B. Swallowing Precautions: Crush meds as able,  consider liquid form   C. Recommended consultation to:  evaluation of the upper esophagus and consideration of esophageal dilation.   D. Therapy recommendations N/A   E. Results and  recommendations were discussed with the patient immediately following the study and the final report will be routed to referring MD.         G-Codes - February 09, 2015 1354    Functional Assessment Tool Used MBS   Functional Limitations Swallowing   Swallow Current Status (Y4034) At least 1 percent but less than 20 percent impaired, limited or restricted   Swallow Goal Status (V4259) At least 1 percent but less than 20 percent impaired, limited or restricted   Swallow Discharge Status [phone]) At least 1 percent but less than 20 percent impaired, limited or restricted          Problem List There are no active problems to display for this patient.  Dollene Primrose, MS/CCC- SLP  Leandrew Koyanagi 2015/02/09, 1:59 PM  [facility] DIAGNOSTIC RADIOLOGY [address] [place], [place], [address] Phone: [phone]   Fax:

## 2015-05-22 ENCOUNTER — Encounter
Admission: RE | Disposition: A | Discharge status: Home / Self Care | Payer: Self-pay | Source: Ambulatory Visit | Attending: Ophthalmology

## 2015-05-22 ENCOUNTER — Ambulatory Visit: Payer: Medicare Other | Admitting: Anesthesiology

## 2015-05-22 ENCOUNTER — Encounter: Payer: Self-pay | Admitting: *Deleted

## 2015-05-22 ENCOUNTER — Ambulatory Visit
Admission: RE | Admit: 2015-05-22 | Discharge: 2015-05-22 | Disposition: A | Discharge status: Home / Self Care | Payer: Medicare Other | Source: Ambulatory Visit | Attending: Ophthalmology | Admitting: Ophthalmology

## 2015-05-22 DIAGNOSIS — Z886 Allergy status to analgesic agent status: Secondary | ICD-10-CM | POA: Diagnosis not present

## 2015-05-22 DIAGNOSIS — H2512 Age-related nuclear cataract, left eye: Secondary | ICD-10-CM | POA: Insufficient documentation

## 2015-05-22 DIAGNOSIS — I10 Essential (primary) hypertension: Secondary | ICD-10-CM | POA: Insufficient documentation

## 2015-05-22 DIAGNOSIS — Z7984 Long term (current) use of oral hypoglycemic drugs: Secondary | ICD-10-CM | POA: Insufficient documentation

## 2015-05-22 DIAGNOSIS — E119 Type 2 diabetes mellitus without complications: Secondary | ICD-10-CM | POA: Insufficient documentation

## 2015-05-22 DIAGNOSIS — Z888 Allergy status to other drugs, medicaments and biological substances status: Secondary | ICD-10-CM | POA: Diagnosis not present

## 2015-05-22 DIAGNOSIS — Z87891 Personal history of nicotine dependence: Secondary | ICD-10-CM | POA: Insufficient documentation

## 2015-05-22 DIAGNOSIS — Z9049 Acquired absence of other specified parts of digestive tract: Secondary | ICD-10-CM | POA: Diagnosis not present

## 2015-05-22 DIAGNOSIS — Z7982 Long term (current) use of aspirin: Secondary | ICD-10-CM | POA: Insufficient documentation

## 2015-05-22 DIAGNOSIS — Z79899 Other long term (current) drug therapy: Secondary | ICD-10-CM | POA: Diagnosis not present

## 2015-05-22 DIAGNOSIS — H269 Unspecified cataract: Secondary | ICD-10-CM | POA: Diagnosis present

## 2015-05-22 DIAGNOSIS — E7800 Pure hypercholesterolemia, unspecified: Secondary | ICD-10-CM | POA: Insufficient documentation

## 2015-05-22 DIAGNOSIS — Z9889 Other specified postprocedural states: Secondary | ICD-10-CM | POA: Diagnosis not present

## 2015-05-22 DIAGNOSIS — Z8673 Personal history of transient ischemic attack (TIA), and cerebral infarction without residual deficits: Secondary | ICD-10-CM | POA: Diagnosis not present

## 2015-05-22 HISTORY — DX: Transient cerebral ischemic attack, unspecified: G45.9

## 2015-05-22 HISTORY — DX: Cardiac murmur, unspecified: R01.1

## 2015-05-22 HISTORY — DX: Other abnormalities of gait and mobility: R26.89

## 2015-05-22 HISTORY — DX: Type 2 diabetes mellitus without complications: E11.9

## 2015-05-22 HISTORY — DX: Pure hypercholesterolemia, unspecified: E78.00

## 2015-05-22 HISTORY — DX: Anemia, unspecified: D64.9

## 2015-05-22 HISTORY — PX: CATARACT EXTRACTION W/PHACO: SHX586

## 2015-05-22 HISTORY — DX: Diverticulosis of intestine, part unspecified, without perforation or abscess without bleeding: K57.90

## 2015-05-22 HISTORY — DX: Essential (primary) hypertension: I10

## 2015-05-22 LAB — GLUCOSE, CAPILLARY: Glucose-Capillary: 126 mg/dL — ABNORMAL HIGH (ref 65–99)

## 2015-05-22 SURGERY — PHACOEMULSIFICATION, CATARACT, WITH IOL INSERTION
Anesthesia: Monitor Anesthesia Care | Site: Eye | Laterality: Left | Wound class: Clean

## 2015-05-22 MED ORDER — TETRACAINE HCL 0.5 % OP SOLN
OPHTHALMIC | Status: AC
  Administered 2015-05-22: 1 [drp] via OPHTHALMIC
  Filled 2015-05-22: qty 2

## 2015-05-22 MED ORDER — NA CHONDROIT SULF-NA HYALURON 40-17 MG/ML IO SOLN
INTRAOCULAR | Status: AC
  Filled 2015-05-22: qty 1

## 2015-05-22 MED ORDER — MOXIFLOXACIN HCL 0.5 % OP SOLN
OPHTHALMIC | Status: DC | PRN
  Administered 2015-05-22: 4 [drp] via OPHTHALMIC

## 2015-05-22 MED ORDER — SODIUM CHLORIDE 0.9 % IV SOLN
INTRAVENOUS | Status: DC
  Administered 2015-05-22 (×2): via INTRAVENOUS

## 2015-05-22 MED ORDER — MOXIFLOXACIN HCL 0.5 % OP SOLN
OPHTHALMIC | Status: AC
  Filled 2015-05-22: qty 3

## 2015-05-22 MED ORDER — ARMC OPHTHALMIC DILATING GEL
1.0000 "application " | OPHTHALMIC | Status: DC | PRN
  Administered 2015-05-22: 1 via OPHTHALMIC

## 2015-05-22 MED ORDER — POVIDONE-IODINE 5 % OP SOLN
OPHTHALMIC | Status: AC
  Administered 2015-05-22: 1 via OPHTHALMIC
  Filled 2015-05-22: qty 30

## 2015-05-22 MED ORDER — NA CHONDROIT SULF-NA HYALURON 40-17 MG/ML IO SOLN
INTRAOCULAR | Status: DC | PRN
  Administered 2015-05-22: 1 mL via INTRAOCULAR

## 2015-05-22 MED ORDER — MOXIFLOXACIN HCL 0.5 % OP SOLN: 1.0000 [drp] | OPHTHALMIC | Status: DC | PRN

## 2015-05-22 MED ORDER — CARBACHOL 0.01 % IO SOLN
INTRAOCULAR | Status: DC | PRN
  Administered 2015-05-22: 0.5 mL via INTRAOCULAR

## 2015-05-22 MED ORDER — CEFUROXIME OPHTHALMIC INJECTION 1 MG/0.1 ML
INJECTION | OPHTHALMIC | Status: AC
  Filled 2015-05-22: qty 0.1

## 2015-05-22 MED ORDER — CEFUROXIME OPHTHALMIC INJECTION 1 MG/0.1 ML
INJECTION | OPHTHALMIC | Status: DC | PRN
  Administered 2015-05-22: 1 mg via INTRACAMERAL

## 2015-05-22 MED ORDER — EPHEDRINE SULFATE 50 MG/ML IJ SOLN
INTRAMUSCULAR | Status: DC | PRN
  Administered 2015-05-22: 5 mg via INTRAVENOUS

## 2015-05-22 MED ORDER — ALFENTANIL 500 MCG/ML IJ INJ
INJECTION | INTRAMUSCULAR | Status: DC | PRN
  Administered 2015-05-22: 500 ug via INTRAVENOUS

## 2015-05-22 MED ORDER — EPINEPHRINE HCL 1 MG/ML IJ SOLN
INTRAOCULAR | Status: DC | PRN
  Administered 2015-05-22: 250 mL via OPHTHALMIC

## 2015-05-22 MED ORDER — EPINEPHRINE HCL 1 MG/ML IJ SOLN
INTRAMUSCULAR | Status: AC
  Filled 2015-05-22: qty 1

## 2015-05-22 MED ORDER — TETRACAINE HCL 0.5 % OP SOLN
1.0000 [drp] | OPHTHALMIC | Status: AC | PRN
  Administered 2015-05-22: 1 [drp] via OPHTHALMIC

## 2015-05-22 MED ORDER — ARMC OPHTHALMIC DILATING GEL
OPHTHALMIC | Status: AC
  Administered 2015-05-22: 1 via OPHTHALMIC
  Filled 2015-05-22: qty 0.25

## 2015-05-22 MED ORDER — POVIDONE-IODINE 5 % OP SOLN
1.0000 "application " | OPHTHALMIC | Status: AC | PRN
  Administered 2015-05-22: 1 via OPHTHALMIC

## 2015-05-22 SURGICAL SUPPLY — 22 items
CANNULA ANT/CHMB 27GA (MISCELLANEOUS) ×3 IMPLANT
CUP MEDICINE 2OZ PLAST GRAD ST (MISCELLANEOUS) ×3 IMPLANT
GLOVE BIO SURGEON STRL SZ8 (GLOVE) ×3 IMPLANT
GLOVE BIOGEL M 6.5 STRL (GLOVE) ×3 IMPLANT
GLOVE SURG LX 8.0 MICRO (GLOVE) ×2
GLOVE SURG LX STRL 8.0 MICRO (GLOVE) ×1 IMPLANT
GOWN STRL REUS W/ TWL LRG LVL3 (GOWN DISPOSABLE) ×2 IMPLANT
GOWN STRL REUS W/TWL LRG LVL3 (GOWN DISPOSABLE) ×4
LENS IOL TECNIS 22.5 (Intraocular Lens) ×3 IMPLANT
LENS IOL TECNIS MONO 1P 22.5 (Intraocular Lens) ×1 IMPLANT
PACK CATARACT (MISCELLANEOUS) ×3 IMPLANT
PACK CATARACT BRASINGTON LX (MISCELLANEOUS) ×3 IMPLANT
PACK EYE AFTER SURG (MISCELLANEOUS) ×3 IMPLANT
SOL BSS BAG (MISCELLANEOUS) ×3
SOL PREP PVP 2OZ (MISCELLANEOUS) ×3
SOLUTION BSS BAG (MISCELLANEOUS) ×1 IMPLANT
SOLUTION PREP PVP 2OZ (MISCELLANEOUS) ×1 IMPLANT
SYR 3ML LL SCALE MARK (SYRINGE) ×3 IMPLANT
SYR 5ML LL (SYRINGE) ×3 IMPLANT
SYR TB 1ML 27GX1/2 LL (SYRINGE) ×3 IMPLANT
WATER STERILE IRR 1000ML POUR (IV SOLUTION) ×3 IMPLANT
WIPE NON LINTING 3.25X3.25 (MISCELLANEOUS) ×3 IMPLANT

## 2015-05-22 NOTE — H&P (Signed)
All labs reviewed. Abnormal studies sent to patients PCP when indicated.  Previous H&P reviewed, patient examined, there are NO CHANGES.  Susan [name] LOUIS2/7/201710:20 AM

## 2015-05-22 NOTE — Anesthesia Postprocedure Evaluation (Signed)
Anesthesia Post Note  Patient: Susan [name]  Procedure(s) Performed: Procedure(s) (LRB): CATARACT EXTRACTION PHACO AND INTRAOCULAR LENS PLACEMENT (IOC) (Left)  Patient location during evaluation: PACU Anesthesia Type: General Level of consciousness: awake and alert Pain management: pain level controlled Vital Signs Assessment: post-procedure vital signs reviewed and stable Respiratory status: spontaneous breathing, nonlabored ventilation, respiratory function stable and patient connected to nasal cannula oxygen Cardiovascular status: blood pressure returned to baseline and stable Postop Assessment: no signs of nausea or vomiting Anesthetic complications: no    Last Vitals:  Filed Vitals:   05/22/15 1050 05/22/15 1104  BP: 167/78 173/74  Pulse: 74 71  Temp: 36.7 C   Resp: 16 16    Last Pain: There were no vitals filed for this visit.               Lenard Simmer

## 2015-05-22 NOTE — Transfer of Care (Signed)
Immediate Anesthesia Transfer of Care Note  Patient: [name]  Procedure(s) Performed: Procedure(s) with comments: CATARACT EXTRACTION PHACO AND INTRAOCULAR LENS PLACEMENT (IOC) (Left) - [place]: 01:07.9 AP%: 23.0 CDE: 15.62 Lot# [id number]  Patient Location: PACU and Short Stay  Anesthesia Type:MAC  Level of Consciousness: awake, oriented and patient cooperative  Airway & Oxygen Therapy: Patient Spontanous Breathing  Post-op Assessment: Report given to RN and Post -op Vital signs reviewed and stable  Post vital signs: Reviewed and stable  Last Vitals:  Filed Vitals:   05/22/15 0904  BP: 184/82  Pulse: 74  Temp: 36.8 C  Resp: 16    Complications: No apparent anesthesia complications

## 2015-05-22 NOTE — Discharge Instructions (Signed)
Eye Surgery Discharge Instructions  Expect mild scratchy sensation or mild soreness. DO NOT RUB YOUR EYE!  The day of surgery:  Minimal physical activity, but bed rest is not required  No reading, computer work, or close hand work  No bending, lifting, or straining.  May watch TV  For 24 hours:  No driving, legal decisions, or alcoholic beverages  Safety precautions  Eat anything you prefer: It is better to start with liquids, then soup then solid foods.  _____ Eye patch should be worn until postoperative exam tomorrow.  ____ Solar shield eyeglasses should be worn for comfort in the sunlight/patch while sleeping  Resume all regular medications including aspirin or Coumadin if these were discontinued prior to surgery. You may shower, bathe, shave, or wash your hair. Tylenol may be taken for mild discomfort.  Call your doctor if you experience significant pain, nausea, or vomiting, fever > 101 or other signs of infection. [phone] or [phone] Specific instructions:  Follow-up Information    Follow up with Carlena Bjornstad, MD On 05/23/2015.   Specialty:  Ophthalmology   Why:  8:50   Contact information:   [address] [place] [address] [phone]

## 2015-05-22 NOTE — Op Note (Signed)
PREOPERATIVE DIAGNOSIS:  Nuclear sclerotic cataract of the left eye.   POSTOPERATIVE DIAGNOSIS:  left nuclear sclerotic cataract   OPERATIVE PROCEDURE:  Procedure(s): CATARACT EXTRACTION PHACO AND INTRAOCULAR LENS PLACEMENT (IOC)   SURGEON:  Galen Manila, MD.   ANESTHESIA:   No anesthesia staff entered.  1.      Managed anesthesia care. 2.      Topical tetracaine drops followed by 2% Xylocaine jelly applied in the preoperative holding area.   COMPLICATIONS:  None.   TECHNIQUE:   Stop and chop   DESCRIPTION OF PROCEDURE:  The patient was examined and consented in the preoperative holding area where the aforementioned topical anesthesia was applied to the left eye and then brought back to the Operating Room where the left eye was prepped and draped in the usual sterile ophthalmic fashion and a lid speculum was placed. A paracentesis was created with the side port blade and the anterior chamber was filled with viscoelastic. A near clear corneal incision was performed with the steel keratome. A continuous curvilinear capsulorrhexis was performed with a cystotome followed by the capsulorrhexis forceps. Hydrodissection and hydrodelineation were carried out with BSS on a blunt cannula. The lens was removed in a stop and chop  technique and the remaining cortical material was removed with the irrigation-aspiration handpiece. The capsular bag was inflated with viscoelastic and the Technis ZCB00 lens was placed in the capsular bag without complication. The remaining viscoelastic was removed from the eye with the irrigation-aspiration handpiece. The wounds were hydrated. The anterior chamber was flushed with Miostat and the eye was inflated to physiologic pressure. 0.1 mL of cefuroxime concentration 10 mg/mL was placed in the anterior chamber. The wounds were found to be water tight. The eye was dressed with Vigamox. The patient was given protective glasses to wear throughout the day and a shield with  which to sleep tonight. The patient was also given drops with which to begin a drop regimen today and will follow-up with me in one day.  Implant Name Type Inv. Item Serial No. Manufacturer Lot No. LRB No. Used  LENS IMPL INTRAOC ZCB00 22.5 - [id number] Intraocular Lens LENS IMPL INTRAOC ZCB00 22.5 [id number]   Left 1   Procedure(s) with comments: CATARACT EXTRACTION PHACO AND INTRAOCULAR LENS PLACEMENT (IOC) (Left) - [place]: 01:07.9 AP%: 23.0 CDE: 15.62 Lot# [id number]  Electronically signed: Madisynn Plair LOUIS 05/22/2015 10:47 AM

## 2015-05-22 NOTE — Anesthesia Preprocedure Evaluation (Signed)
Anesthesia Evaluation  Patient identified by MRN, date of birth, ID band Patient awake    Reviewed: Allergy & Precautions, H&P , NPO status , Patient's Chart, lab work & pertinent test results, reviewed documented beta blocker date and time   History of Anesthesia Complications Negative for: history of anesthetic complications  Airway Mallampati: I  TM Distance: >3 FB Neck ROM: full    Dental no notable dental hx. (+) Upper Dentures, Lower Dentures   Pulmonary neg shortness of breath, neg sleep apnea, neg COPD, neg recent URI, former smoker,    Pulmonary exam normal breath sounds clear to auscultation       Cardiovascular Exercise Tolerance: Good hypertension, (-) angina(-) CAD, (-) Past MI, (-) Cardiac Stents and (-) CABG Normal cardiovascular exam(-) dysrhythmias + Valvular Problems/Murmurs  Rhythm:regular Rate:Normal     Neuro/Psych neg Seizures TIA (in 2006)negative psych ROS   GI/Hepatic negative GI ROS, Neg liver ROS,   Endo/Other  diabetes  Renal/GU negative Renal ROS  negative genitourinary   Musculoskeletal   Abdominal   Peds  Hematology negative hematology ROS (+)   Anesthesia Other Findings Past Medical History:   Hypertension                                                 Heart murmur                                                 TIA (transient ischemic attack)                 2006         Balance problem                                              Diverticulosis                                               Anemia                                                       Diabetes mellitus without complication (HCC)                 Hypercholesteremia                                           Reproductive/Obstetrics negative OB ROS                             Anesthesia Physical Anesthesia Plan  ASA: III  Anesthesia Plan: MAC   Post-op Pain Management:    Induction:    Airway Management Planned:   Additional  Equipment:   Intra-op Plan:   Post-operative Plan:   Informed Consent: I have reviewed the patients History and Physical, chart, labs and discussed the procedure including the risks, benefits and alternatives for the proposed anesthesia with the patient or authorized representative who has indicated his/her understanding and acceptance.   Dental Advisory Given  Plan Discussed with: Anesthesiologist, CRNA and Surgeon  Anesthesia Plan Comments:         Anesthesia Quick Evaluation

## 2015-06-11 ENCOUNTER — Encounter: Payer: Self-pay | Admitting: *Deleted

## 2015-06-11 NOTE — Progress Notes (Signed)
Discussed pt's naprosyn allergy with Alvino Chapel at MD office 06/11/15 245 pm - per Alvino Chapel it's because pt has diverticulitis and per Alvino Chapel ok for pt to get ophthalmic dilating gel, so order entered by Nicholes Rough, RN

## 2015-06-12 ENCOUNTER — Ambulatory Visit: Payer: Medicare Other | Admitting: Anesthesiology

## 2015-06-12 ENCOUNTER — Encounter
Admission: RE | Disposition: A | Discharge status: Home / Self Care | Payer: Self-pay | Source: Ambulatory Visit | Attending: Ophthalmology

## 2015-06-12 ENCOUNTER — Encounter: Payer: Self-pay | Admitting: *Deleted

## 2015-06-12 ENCOUNTER — Ambulatory Visit
Admission: RE | Admit: 2015-06-12 | Discharge: 2015-06-12 | Disposition: A | Discharge status: Home / Self Care | Payer: Medicare Other | Source: Ambulatory Visit | Attending: Ophthalmology | Admitting: Ophthalmology

## 2015-06-12 DIAGNOSIS — E7800 Pure hypercholesterolemia, unspecified: Secondary | ICD-10-CM | POA: Insufficient documentation

## 2015-06-12 DIAGNOSIS — E119 Type 2 diabetes mellitus without complications: Secondary | ICD-10-CM | POA: Diagnosis not present

## 2015-06-12 DIAGNOSIS — Z8673 Personal history of transient ischemic attack (TIA), and cerebral infarction without residual deficits: Secondary | ICD-10-CM | POA: Insufficient documentation

## 2015-06-12 DIAGNOSIS — Z9049 Acquired absence of other specified parts of digestive tract: Secondary | ICD-10-CM | POA: Diagnosis not present

## 2015-06-12 DIAGNOSIS — H2511 Age-related nuclear cataract, right eye: Secondary | ICD-10-CM | POA: Diagnosis present

## 2015-06-12 DIAGNOSIS — I10 Essential (primary) hypertension: Secondary | ICD-10-CM | POA: Diagnosis not present

## 2015-06-12 DIAGNOSIS — Z87891 Personal history of nicotine dependence: Secondary | ICD-10-CM | POA: Diagnosis not present

## 2015-06-12 DIAGNOSIS — Z888 Allergy status to other drugs, medicaments and biological substances status: Secondary | ICD-10-CM | POA: Diagnosis not present

## 2015-06-12 HISTORY — PX: CATARACT EXTRACTION W/PHACO: SHX586

## 2015-06-12 HISTORY — DX: Cerebral infarction, unspecified: I63.9

## 2015-06-12 LAB — GLUCOSE, CAPILLARY: GLUCOSE-CAPILLARY: 133 mg/dL — AB (ref 65–99)

## 2015-06-12 SURGERY — PHACOEMULSIFICATION, CATARACT, WITH IOL INSERTION
Anesthesia: Monitor Anesthesia Care | Site: Eye | Laterality: Right | Wound class: Clean

## 2015-06-12 MED ORDER — MIDAZOLAM HCL 2 MG/2ML IJ SOLN
INTRAMUSCULAR | Status: DC | PRN
  Administered 2015-06-12: 0.5 mg via INTRAVENOUS

## 2015-06-12 MED ORDER — TETRACAINE HCL 0.5 % OP SOLN
1.0000 [drp] | OPHTHALMIC | Status: AC | PRN
  Administered 2015-06-12: 1 [drp] via OPHTHALMIC

## 2015-06-12 MED ORDER — POVIDONE-IODINE 5 % OP SOLN
1.0000 "application " | OPHTHALMIC | Status: AC | PRN
  Administered 2015-06-12: 1 via OPHTHALMIC

## 2015-06-12 MED ORDER — EPINEPHRINE HCL 1 MG/ML IJ SOLN
INTRAOCULAR | Status: DC | PRN
  Administered 2015-06-12: 1 mL via OPHTHALMIC

## 2015-06-12 MED ORDER — MOXIFLOXACIN HCL 0.5 % OP SOLN: 1.0000 [drp] | OPHTHALMIC | Status: DC | PRN

## 2015-06-12 MED ORDER — ARMC OPHTHALMIC DILATING GEL
1.0000 "application " | OPHTHALMIC | Status: DC | PRN
  Administered 2015-06-12: 1 via OPHTHALMIC

## 2015-06-12 MED ORDER — TETRACAINE HCL 0.5 % OP SOLN
OPHTHALMIC | Status: AC
  Administered 2015-06-12: 1 [drp] via OPHTHALMIC
  Filled 2015-06-12: qty 2

## 2015-06-12 MED ORDER — FENTANYL CITRATE (PF) 100 MCG/2ML IJ SOLN
INTRAMUSCULAR | Status: DC | PRN
  Administered 2015-06-12: 25 ug via INTRAVENOUS

## 2015-06-12 MED ORDER — SODIUM CHLORIDE 0.9 % IV SOLN
INTRAVENOUS | Status: DC
  Administered 2015-06-12: 10:00:00 via INTRAVENOUS

## 2015-06-12 MED ORDER — MOXIFLOXACIN HCL 0.5 % OP SOLN
OPHTHALMIC | Status: AC
  Filled 2015-06-12: qty 3

## 2015-06-12 MED ORDER — EPINEPHRINE HCL 1 MG/ML IJ SOLN
INTRAMUSCULAR | Status: AC
  Filled 2015-06-12: qty 1

## 2015-06-12 MED ORDER — CEFUROXIME OPHTHALMIC INJECTION 1 MG/0.1 ML
INJECTION | OPHTHALMIC | Status: AC
  Filled 2015-06-12: qty 0.1

## 2015-06-12 MED ORDER — CEFUROXIME OPHTHALMIC INJECTION 1 MG/0.1 ML
INJECTION | OPHTHALMIC | Status: DC | PRN
  Administered 2015-06-12: .1 mL via INTRACAMERAL

## 2015-06-12 MED ORDER — ARMC OPHTHALMIC DILATING GEL
OPHTHALMIC | Status: AC
  Administered 2015-06-12: 1 via OPHTHALMIC
  Filled 2015-06-12: qty 0.25

## 2015-06-12 MED ORDER — NA CHONDROIT SULF-NA HYALURON 40-17 MG/ML IO SOLN
INTRAOCULAR | Status: DC | PRN
  Administered 2015-06-12: 1 mL via INTRAOCULAR

## 2015-06-12 MED ORDER — MOXIFLOXACIN HCL 0.5 % OP SOLN
OPHTHALMIC | Status: DC | PRN
  Administered 2015-06-12: 1 [drp] via OPHTHALMIC

## 2015-06-12 MED ORDER — POVIDONE-IODINE 5 % OP SOLN
OPHTHALMIC | Status: AC
  Administered 2015-06-12: 1 via OPHTHALMIC
  Filled 2015-06-12: qty 30

## 2015-06-12 MED ORDER — CARBACHOL 0.01 % IO SOLN
INTRAOCULAR | Status: DC | PRN
  Administered 2015-06-12: .5 mL via INTRAOCULAR

## 2015-06-12 SURGICAL SUPPLY — 22 items
CANNULA ANT/CHMB 27GA (MISCELLANEOUS) ×3 IMPLANT
CUP MEDICINE 2OZ PLAST GRAD ST (MISCELLANEOUS) ×3 IMPLANT
GLOVE BIO SURGEON STRL SZ8 (GLOVE) ×3 IMPLANT
GLOVE BIOGEL M 6.5 STRL (GLOVE) ×3 IMPLANT
GLOVE SURG LX 8.0 MICRO (GLOVE) ×2
GLOVE SURG LX STRL 8.0 MICRO (GLOVE) ×1 IMPLANT
GOWN STRL REUS W/ TWL LRG LVL3 (GOWN DISPOSABLE) ×2 IMPLANT
GOWN STRL REUS W/TWL LRG LVL3 (GOWN DISPOSABLE) ×4
LENS IOL TECNIS 22.5 (Intraocular Lens) ×3 IMPLANT
LENS IOL TECNIS MONO 1P 22.5 (Intraocular Lens) ×1 IMPLANT
PACK CATARACT (MISCELLANEOUS) ×3 IMPLANT
PACK CATARACT BRASINGTON LX (MISCELLANEOUS) ×3 IMPLANT
PACK EYE AFTER SURG (MISCELLANEOUS) ×3 IMPLANT
SOL BSS BAG (MISCELLANEOUS) ×3
SOL PREP PVP 2OZ (MISCELLANEOUS) ×3
SOLUTION BSS BAG (MISCELLANEOUS) ×1 IMPLANT
SOLUTION PREP PVP 2OZ (MISCELLANEOUS) ×1 IMPLANT
SYR 3ML LL SCALE MARK (SYRINGE) ×3 IMPLANT
SYR 5ML LL (SYRINGE) ×3 IMPLANT
SYR TB 1ML 27GX1/2 LL (SYRINGE) ×3 IMPLANT
WATER STERILE IRR 1000ML POUR (IV SOLUTION) ×3 IMPLANT
WIPE NON LINTING 3.25X3.25 (MISCELLANEOUS) ×3 IMPLANT

## 2015-06-12 NOTE — H&P (Signed)
All labs reviewed. Abnormal studies sent to patients PCP when indicated.  Previous H&P reviewed, patient examined, there are NO CHANGES.  Susan [name] LOUIS2/28/201711:01 AM

## 2015-06-12 NOTE — Transfer of Care (Signed)
Immediate Anesthesia Transfer of Care Note  Patient: Susan [name]  Procedure(s) Performed: Procedure(s) with comments: CATARACT EXTRACTION PHACO AND INTRAOCULAR LENS PLACEMENT (IOC) (Right) - [place] 00:57 AP% 17.1 CDE 9.86 fluid pack lot # [id number]  Patient Location: PACU  Anesthesia Type:MAC  Level of Consciousness: awake, alert  and oriented  Airway & Oxygen Therapy: Patient Spontanous Breathing  Post-op Assessment: Report given to RN and Post -op Vital signs reviewed and stable  Post vital signs: Reviewed and stable  Last Vitals:  Filed Vitals:   06/12/15 1009  BP: 170/98  Pulse: 73  Temp: 36.4 C  Resp: 18    Complications: No apparent anesthesia complications

## 2015-06-12 NOTE — Op Note (Signed)
PREOPERATIVE DIAGNOSIS:  Nuclear sclerotic cataract of the right eye.   POSTOPERATIVE DIAGNOSIS: nuclear sclerotic cataract right eye   OPERATIVE PROCEDURE:  Procedure(s): CATARACT EXTRACTION PHACO AND INTRAOCULAR LENS PLACEMENT (IOC)   SURGEON:  Galen Manila, MD.   ANESTHESIA:  Anesthesiologist: Yevette Edwards, MD CRNA: Casey Burkitt, CRNA  1.      Managed anesthesia care. 2.      Topical tetracaine drops followed by 2% Xylocaine jelly applied in the preoperative holding area.   COMPLICATIONS:  None.   TECHNIQUE:   Stop and chop   DESCRIPTION OF PROCEDURE:  The patient was examined and consented in the preoperative holding area where the aforementioned topical anesthesia was applied to the right eye and then brought back to the Operating Room where the right eye was prepped and draped in the usual sterile ophthalmic fashion and a lid speculum was placed. A paracentesis was created with the side port blade and the anterior chamber was filled with viscoelastic. A near clear corneal incision was performed with the steel keratome. A continuous curvilinear capsulorrhexis was performed with a cystotome followed by the capsulorrhexis forceps. Hydrodissection and hydrodelineation were carried out with BSS on a blunt cannula. The lens was removed in a stop and chop  technique and the remaining cortical material was removed with the irrigation-aspiration handpiece. The capsular bag was inflated with viscoelastic and the Technis ZCB00  lens was placed in the capsular bag without complication. The remaining viscoelastic was removed from the eye with the irrigation-aspiration handpiece. The wounds were hydrated. The anterior chamber was flushed with Miostat and the eye was inflated to physiologic pressure. 0.1 mL of cefuroxime concentration 10 mg/mL was placed in the anterior chamber. The wounds were found to be water tight. The eye was dressed with Vigamox. The patient was given protective glasses to wear  throughout the day and a shield with which to sleep tonight. The patient was also given drops with which to begin a drop regimen today and will follow-up with me in one day.  Implant Name Type Inv. Item Serial No. Manufacturer Lot No. LRB No. Used  LENS IOL TECNIS 22.5 - [id number] Intraocular Lens LENS IOL TECNIS 22.5 [id number] Right 1   Procedure(s) with comments: CATARACT EXTRACTION PHACO AND INTRAOCULAR LENS PLACEMENT (IOC) (Right) - [place] 00:57 AP% 17.1 CDE 9.86 fluid pack lot # [id number]  Electronically signed: Lakai Moree LOUIS 06/12/2015 11:27 AM

## 2015-06-12 NOTE — Discharge Instructions (Signed)
AMBULATORY SURGERY  DISCHARGE INSTRUCTIONS   1) The drugs that you were given will stay in your system until tomorrow so for the next 24 hours you should not:  A) Drive an automobile B) Make any legal decisions C) Drink any alcoholic beverage   2) You may resume regular meals tomorrow.  Today it is better to start with liquids and gradually work up to solid foods.  You may eat anything you prefer, but it is better to start with liquids, then soup and crackers, and gradually work up to solid foods.   3) Please notify your doctor immediately if you have any unusual bleeding, trouble breathing, redness and pain at the surgery site, drainage, fever, or pain not relieved by medication.    4) Additional Instructions:    Eye Surgery Discharge Instructions  Expect mild scratchy sensation or mild soreness. DO NOT RUB YOUR EYE!  The day of surgery:  Minimal physical activity, but bed rest is not required  No reading, computer work, or close hand work  No bending, lifting, or straining.  May watch TV  For 24 hours:  No driving, legal decisions, or alcoholic beverages  Safety precautions  Eat anything you prefer: It is better to start with liquids, then soup then solid foods.  _____ Eye patch should be worn until postoperative exam tomorrow.  ____ Solar shield eyeglasses should be worn for comfort in the sunlight/patch while sleeping  Resume all regular medications including aspirin or Coumadin if these were discontinued prior to surgery. You may shower, bathe, shave, or wash your hair. Tylenol may be taken for mild discomfort.  Call your doctor if you experience significant pain, nausea, or vomiting, fever > 101 or other signs of infection. [phone] or [phone] Specific instructions:  Follow-up Information    Follow up with PORFILIO,WILLIAM LOUIS, MD In 1 day.   Specialty:  Ophthalmology   Why:  MARCH 1 AT11:00AM   Contact information:   [address] [place] [address] [phone]         Please contact your physician with any problems or Same Day Surgery at [phone], Monday through Friday 6 am to 4 pm, or [place] at [facility] number at [phone].

## 2015-06-12 NOTE — Anesthesia Postprocedure Evaluation (Signed)
Anesthesia Post Note  Patient: Susan [name]  Procedure(s) Performed: Procedure(s) (LRB): CATARACT EXTRACTION PHACO AND INTRAOCULAR LENS PLACEMENT (IOC) (Right)  Patient location during evaluation: PACU Anesthesia Type: MAC Level of consciousness: awake, oriented and awake and alert Pain management: pain level controlled Vital Signs Assessment: post-procedure vital signs reviewed and stable Respiratory status: spontaneous breathing Cardiovascular status: stable Anesthetic complications: no    Last Vitals:  Filed Vitals:   06/12/15 1009  BP: 170/98  Pulse: 73  Temp: 36.4 C  Resp: 18    Last Pain: There were no vitals filed for this visit.               [employer]

## 2015-06-12 NOTE — Anesthesia Preprocedure Evaluation (Signed)
Anesthesia Evaluation  Patient identified by MRN, date of birth, ID band Patient awake    Reviewed: Allergy & Precautions, H&P , NPO status , Patient's Chart, lab work & pertinent test results, reviewed documented beta blocker date and time   Airway Mallampati: II  TM Distance: >3 FB Neck ROM: full    Dental no notable dental hx. (+) Upper Dentures, Lower Dentures   Pulmonary neg pulmonary ROS, former smoker,    Pulmonary exam normal breath sounds clear to auscultation       Cardiovascular Exercise Tolerance: Good hypertension, negative cardio ROS  + Valvular Problems/Murmurs  Rhythm:regular Rate:Normal     Neuro/Psych TIACVA, No Residual Symptoms negative neurological ROS  negative psych ROS   GI/Hepatic negative GI ROS, Neg liver ROS,   Endo/Other  negative endocrine ROSdiabetes  Renal/GU      Musculoskeletal   Abdominal   Peds  Hematology negative hematology ROS (+) anemia ,   Anesthesia Other Findings   Reproductive/Obstetrics negative OB ROS                             Anesthesia Physical Anesthesia Plan  ASA: III  Anesthesia Plan: MAC   Post-op Pain Management:    Induction:   Airway Management Planned:   Additional Equipment:   Intra-op Plan:   Post-operative Plan:   Informed Consent: I have reviewed the patients History and Physical, chart, labs and discussed the procedure including the risks, benefits and alternatives for the proposed anesthesia with the patient or authorized representative who has indicated his/her understanding and acceptance.     Plan Discussed with: CRNA  Anesthesia Plan Comments:         Anesthesia Quick Evaluation

## 2016-05-02 ENCOUNTER — Ambulatory Visit: Payer: Medicare Other | Admitting: Physical Therapy

## 2016-05-09 ENCOUNTER — Ambulatory Visit: Payer: Medicare Other | Attending: Neurology | Admitting: Physical Therapy

## 2016-05-09 ENCOUNTER — Encounter: Payer: Self-pay | Admitting: Physical Therapy

## 2016-05-09 ENCOUNTER — Encounter: Payer: Medicare Other | Admitting: Physical Therapy

## 2016-05-09 DIAGNOSIS — R42 Dizziness and giddiness: Secondary | ICD-10-CM

## 2016-05-09 DIAGNOSIS — R2681 Unsteadiness on feet: Secondary | ICD-10-CM

## 2016-05-09 DIAGNOSIS — M6281 Muscle weakness (generalized): Secondary | ICD-10-CM | POA: Diagnosis present

## 2016-05-09 NOTE — Therapy (Signed)
[facility]IN [facility] [address] [place], [place], [address] Phone: [phone]   Fax:  [phone]  Physical Therapy Evaluation  Patient Details  Name: Susan StainMary I [name] MRN: [id number] Date of Birth: Jul 18, 1926 Referring Provider: Dr. Malvin JohnsPotter  Encounter Date: 05/09/2016      PT End of Session - 05/09/16 1124    Visit Number 1   Number of Visits 9   Date for PT Re-Evaluation 07/04/16   Authorization Time Period Needs G codes  1/10   PT Start Time 1018   PT Stop Time 1120   PT Time Calculation (min) 62 min   Equipment Utilized During Treatment Gait belt   Activity Tolerance Patient tolerated treatment well   Behavior During Therapy [facility] for tasks assessed/performed      Past Medical History:  Diagnosis Date  . Anemia   . Balance problem   . Diabetes mellitus without complication (HCC)   . Diverticulosis   . Heart murmur   . Hypercholesteremia   . Hypertension   . Stroke [place] [facility](HCC)    TIA 2006  . TIA (transient ischemic attack) 2006    Past Surgical History:  Procedure Laterality Date  . CATARACT EXTRACTION W/PHACO Left 05/22/2015   Procedure: CATARACT EXTRACTION PHACO AND INTRAOCULAR LENS PLACEMENT (IOC);  Surgeon: Galen ManilaWilliam Porfilio, MD;  Location: [facility] ORS;  Service: Ophthalmology;  Laterality: Left;  US: 01:07.9 AP%: 23.0 CDE: 15.62 Lot# [id number]  . CATARACT EXTRACTION W/PHACO Right 06/12/2015   Procedure: CATARACT EXTRACTION PHACO AND INTRAOCULAR LENS PLACEMENT (IOC);  Surgeon: Galen ManilaWilliam Porfilio, MD;  Location: [facility] ORS;  Service: Ophthalmology;  Laterality: Right;  US 00:57 AP% 17.1 CDE 9.86 fluid pack lot # [id number]  . COLON SURGERY     resection  . ECTOPIC PREGNANCY SURGERY    . HERNIA REPAIR    . TONSILLECTOMY      There were no vitals filed for this visit.       Subjective Assessment - 05/12/16 1331    Subjective Patient states she feels her balance and dizziness symptoms have continued to get worse.    Pertinent History Patient has some difficulty reporting her past medical history and description of current problem and no family present. Patient states about 9 years ago she suffered a TIA and states she has been off balance for quite some time. Patient states she started noticing dizziness after the TIA but states it is worse currently. Patient states after she had her last operation for a L CEA in 2017, she has noticed being more off balance and states as she has gotten older she feels her balance has worsened as well. Patient had a HHPT that worked with her for about 6 weeks after the surgery and states most of her exercises were seated exercises. Patient states she does not feel "as wobbly" after she completed HHPT.   Patient Stated Goals pt would like to be able to be steadier on her feet.   Currently in Pain? No/denies        VESTIBULAR AND BALANCE EVALUATION   HISTORY:  Subjective history of current problem: Patient has some difficulty reporting her past medical history and description of current problem and no family present. Patient states about 9 years ago she suffered a TIA and states she has been off balance for quite some time. Patient states she started noticing dizziness after the TIA but states it is worse currently. Patient states after she had her last operation for a L CEA  in 2017, she has noticed being more off balance and states as she has gotten older she feels her balance has worsened as well. Patient had a HHPT that worked with her for about 6 weeks after the surgery and states most of her exercises were seated exercises. Patient states she does not feel "as wobbly" after she completed HHPT. Patient states she returns to Dr. Malvin Johns in June. Patient denies head turns bringing on her symptoms. Patient states she very seldom has symptoms when she is sitting or lying but states the dizziness occurs when she is standing and moving around. Patient states especially when she moves from  sitting to standing up she gets dizziness. Patient denies vertigo. Patient states she has a sensation of rocking. Patient states she uses her walker in her apartment when she is walking longer distance and states she does use her walker in the community. Patient states she drives. Patient states she just got new glasses and is still adjusting to the new prescription.    Description of dizziness:  unsteadiness, falling, general unsteadiness Frequency: "a little bit ,each day for a short period of time and some days are worse than others". States the dizziness tends to be worse in the afternoon.  Duration: seconds to minutes Symptom nature: motion provoked, positional, variable, intermittent   Provocative Factors: quick movement, standing up from sitting position, bending over and coming back up  Easing Factors: sitting down  Progression of symptoms: worse History of similar episodes: none that she recalls  Falls (yes/no): yes Number of falls in past 6 months: 6 and reports that she reaches out for furniture and walls at times as well.   Prior Functional Level: patient reports she was ambulating without AD and driving.   Auditory complaints (tinnitus, pain, drainage): denies Vision (last eye exam, diplopia, recent changes): patient states she had cataract surgery in both eyes last year. Patient states she went to the eye doctor recently and has new eye glasses prescription. No other recent vision changes or complaints  Current Symptoms: (vertigo, N & V, dysarthria, dysphagia, drop attacks, bowel and bladder changes, recent weight loss/gain, lightheadedness, headache, rocking, general unsteadiness, imbalance, tilting, swaying, veering, dizzy, oscillopsia, migraines)  Review of systems negative for red flags.   EXAMINATION   SOMATOSENSORY:   Any N & T in extremities or weakness: denies      COORDINATION: Finger to Nose:   Normal Past Pointing:   Left  &  Right  Normal  MUSCULOSKELETAL  SCREEN: Cervical Spine ROM: AROM cervical spine grossly WFL right/left rotation, flexion and extension   MMT: Patient requires UEs to push off to stand from arm chair.    Gait: Patient arrives to clinic using RW. Patient demonstrates decreased safety with transfers to/from chair with use of RW as well as turning with RW. Patient ambulates with decreased cadence with decreased bilateral step length and height and patient advances the walker too far in front of her at times as well.  Scanning of visual environment with gait is: poor  Balance: Patient demonstrates difficulty with ambulation with turning with RW, difficulty with narrow BOS and SLS activities. Patient demonstrates mild imbalance with initial standing from an arm chair.   POSTURAL CONTROL TESTS:   Clinical Test of Sensory Interaction for Balance    (CTSIB): TBA next visit   OCULOMOTOR / VESTIBULAR TESTING:  Oculomotor Exam- Room Light  Normal Abnormal Comments  Ocular Alignment N    Ocular ROM N    Spontaneous Nystagmus  N    End-Gaze Nystagmus N  AGE APPROPRIATE AT END RANGE  Smooth Pursuit N    Saccades  Abn Small corrective overshoot saccades with left field testing  VOR  Abn Reports blurry vision after stopping activity and reports blurring of target during activity; states it looks like the target is shifting to the left.   VOR Cancellation N    Left Head Thrust   Attempted but patient unable to relax enough to properly test; noted saccadic eye movement with patient trying to hold gaze on target with while turning head.   Right Head Thrust   Attempted but patient unable to relax enough to properly test; noted saccadic eye movement with patient trying to hold gaze on target with while turning head.   Head Shaking Nystagmus  Abn Reported double vision after stopping movement which cleared within 10 seconds and mild trunk sway noted upon stopping head movement     BPPV TESTS: TBA next visit secondary to time  constraints   FUNCTIONAL OUTCOME MEASURES:  Results Comments  DHI 32 Low perception of handicap  ABC Scale 63.7% Falls risk; in need of intervention  DGI 13/24 Falls risk; in need of intervention          [facility] PT Assessment - 05/09/16 1103      Assessment   Medical Diagnosis orthostatic hypertension   Referring Provider Dr. Malvin Johns   Prior Therapy HHPT for strengthening/balance     Precautions   Precautions Fall     Restrictions   Weight Bearing Restrictions No     Balance Screen   Has the patient fallen in the past 6 months Yes   How many times? 6   Has the patient had a decrease in activity level because of a fear of falling?  Yes   Is the patient reluctant to leave their home because of a fear of falling?  Yes     Home Environment   Living Environment Private residence   Living Arrangements Alone   Available Help at Discharge Friend(s);Family   Type of Home Apartment   Home Access Elevator   Home Layout One level   Home Equipment Walker - 2 wheels;Tub bench;Grab bars - tub/shower;Cane - single point;Bedside commode;Toilet riser     Prior Function   Level of Independence Independent with community mobility with device;Independent with basic ADLs  drives   Leisure sewing     Cognition   Overall Cognitive Status Within Functional Limits for tasks assessed     Standardized Balance Assessment   Standardized Balance Assessment Dynamic Gait Index     Dynamic Gait Index   Level Surface Moderate Impairment   Change in Gait Speed Mild Impairment   Gait with Horizontal Head Turns Mild Impairment   Gait with Vertical Head Turns Mild Impairment   Gait and Pivot Turn Moderate Impairment   Step Over Obstacle Moderate Impairment   Step Around Obstacles Mild Impairment   Steps Mild Impairment   Total Score 13       Neuromuscular Re-education:  VOR: Demonstrated and educated as to VOR X1. Patient performed VOR X 1 horiz in sitting 3 rep 1 minute each with vc for  technique to decrease head turn speed if target blurs and for amount of head excursion and to do smooth ROM side to side. Patient reports the target is blurring and reports mild dizziness symptoms. Issued VOR X 1 in sitting for HEP.         PT Education -  06-06-2016 1123    Education provided Yes   Education Details Discussed vestibular rehab and plan of care; discussed and educated as to VOR exercise; issued VOR X 1 for HEP   Person(s) Educated Patient   Methods Demonstration;Explanation;Verbal cues;Handout   Comprehension Verbalized understanding;Returned demonstration;Verbal cues required             PT Long Term Goals - 06-Jun-2016 1130      PT LONG TERM GOAL #1   Title Patient will be able to perform home program independently for self-management.   Time 8   Period Weeks   Status New     PT LONG TERM GOAL #2   Title Patient will reduce falls risk as indicated by Activities Specific Balance Confidence Scale (ABC) >67% by 07/04/2016.   Baseline scored 63% on 06-06-2016   Time 8   Period Weeks   Status New     PT LONG TERM GOAL #3   Title Patient will demonstrate reduced falls risk as evidenced by Dynamic Gait Index (DGI) >19/24 by 07/04/2016.   Baseline scored 13/24 on 06/06/2016   Time 8   Period Weeks   Status New               Plan - 05/12/16 1332    Clinical Impression Statement Patient presents with listed functional decifits and is at risk for falls as evidenced by DGI and ABC scale functional outcome scores. Patient ambulates with RW in clinic but demonstrates decreased safety with turning with use of walker as well as with backing up to sit down in chair. Patient with potential peripheral and central vestibular signs with testing. Patient would benefit from PT services to try to address goals, functional deficits, decrease patient's falls risk and subjective symptoms of dizziness and imbalance.     Rehab Potential Fair   Clinical Impairments Affecting Rehab  Potential Positive Indicators: motivated, reports balance improved with HHPT   Negative Indicators: age, chronicity   PT Frequency 1x / week   PT Duration 8 weeks   PT Treatment/Interventions Canalith Repostioning;Gait training;Stair training;Therapeutic exercise;Balance training;Neuromuscular re-education;Therapeutic activities;Patient/family education;Vestibular   PT Next Visit Plan Review VOR; Dix-Hallpike testing, feet together progressions   PT Home Exercise Plan VOR X 1 in sitting 1 minute reps;    Consulted and Agree with Plan of Care Patient      Patient will benefit from skilled therapeutic intervention in order to improve the following deficits and impairments:  Decreased balance, Decreased safety awareness, Difficulty walking, Decreased strength, Dizziness  Visit Diagnosis: Dizziness and giddiness  Unsteady gait  Muscle weakness (generalized)      G-Codes - 2016-06-06 1128    Functional Assessment Tool Used clincial judgment, DGI, DHI, ABC scale   Functional Limitation Mobility: Walking and moving around   Mobility: Walking and Moving Around Current Status [phone]) At least 40 percent but less than 60 percent impaired, limited or restricted   Mobility: Walking and Moving Around Goal Status [phone]) At least 20 percent but less than 40 percent impaired, limited or restricted       Problem List There are no active problems to display for this patient.  Mardelle Matte PT, DPT Mardelle Matte 2016-06-06, 11:36 AM  [facility] MAIN [facility] SERVICES [address] [place], [place], [address] Phone: [phone]   Fax:  [phone]  Name: Susan [name] MRN: [id number] Date of Birth: 1926/10/05

## 2016-05-16 ENCOUNTER — Encounter: Payer: Self-pay | Admitting: Physical Therapy

## 2016-05-16 ENCOUNTER — Ambulatory Visit: Payer: Medicare Other | Attending: Neurology | Admitting: Physical Therapy

## 2016-05-16 DIAGNOSIS — R42 Dizziness and giddiness: Secondary | ICD-10-CM | POA: Diagnosis not present

## 2016-05-16 DIAGNOSIS — R2681 Unsteadiness on feet: Secondary | ICD-10-CM | POA: Diagnosis present

## 2016-05-16 DIAGNOSIS — M6281 Muscle weakness (generalized): Secondary | ICD-10-CM

## 2016-05-16 NOTE — Therapy (Signed)
[facility] MAIN [facility] SERVICES [address] [place], [place], [address] Phone: [phone]   Fax:  [phone]  Physical Therapy Treatment  Patient Details  Name: Susan [name] MRN: [id number] Date of Birth: 11/12/1926 Referring Provider: Dr. Malvin Johns  Encounter Date: 05/16/2016      PT End of Session - 05/16/16 1123    Visit Number 2   Number of Visits 9   Date for PT Re-Evaluation 07/04/16   Authorization Time Period Needs G codes  1/10   PT Start Time 1116   PT Stop Time 1201   PT Time Calculation (min) 45 min   Equipment Utilized During Treatment Gait belt   Activity Tolerance Patient tolerated treatment well   Behavior During Therapy [facility] for tasks assessed/performed      Past Medical History:  Diagnosis Date  . Anemia   . Balance problem   . Diabetes mellitus without complication (HCC)   . Diverticulosis   . Heart murmur   . Hypercholesteremia   . Hypertension   . Stroke [facility])    TIA 2006  . TIA (transient ischemic attack) 2006    Past Surgical History:  Procedure Laterality Date  . CATARACT EXTRACTION W/PHACO Left 05/22/2015   Procedure: CATARACT EXTRACTION PHACO AND INTRAOCULAR LENS PLACEMENT (IOC);  Surgeon: Galen Manila, MD;  Location: [facility] ORS;  Service: Ophthalmology;  Laterality: Left;  [place]: 01:07.9 AP%: 23.0 CDE: 15.62 Lot# [id number]  . CATARACT EXTRACTION W/PHACO Right 06/12/2015   Procedure: CATARACT EXTRACTION PHACO AND INTRAOCULAR LENS PLACEMENT (IOC);  Surgeon: Galen Manila, MD;  Location: [facility] ORS;  Service: Ophthalmology;  Laterality: Right;  [place] 00:57 AP% 17.1 CDE 9.86 fluid pack lot # [id number]  . COLON SURGERY     resection  . ECTOPIC PREGNANCY SURGERY    . HERNIA REPAIR    . TONSILLECTOMY      There were no vitals filed for this visit.      Subjective Assessment - 05/16/16 1122    Subjective Patient states she tried doing the VOR exercise. Patient states she is feeling well today.    Pertinent History Patient has some difficulty reporting her past medical history and description of current problem and no family present. Patient states about 9 years ago she suffered a TIA and states she has been off balance for quite some time. Patient states she started noticing dizziness after the TIA but states it is worse currently. Patient states after she had her last operation for a L CEA in 2017, she has noticed being more off balance and states as she has gotten older she feels her balance has worsened as well. Patient had a HHPT that worked with her for about 6 weeks after the surgery and states most of her exercises were seated exercises. Patient states she does not feel "as wobbly" after she completed HHPT.   Patient Stated Goals pt would like to be able to be steadier on her feet.   Currently in Pain? No/denies      Neuromuscular Re-education:  VOR x 1:  Patient performed VOR X 1 horiz in standing 2 reps of 1 minute each with verbal cues for technique. Patient reports 4-5/10 unsteadiness/dizziness with this activity. Patient with forward/anterior sway noted and required SBA. Instructed patient to continue to perform VOR X 1 in sitting at home for HEP. Patient demonstrating good technique with exercise.    On firm surface: On firm surface, patient performed feet together progressions (progressed to feet about 2"  apart) with and without horiz and vert head turns and performed semi-tandem progressions with alternating lead leg with and without horiz head turns with CGA. Discussed importance of safety with HEP. Patient practice standing in corner with chair in front for safety and discussed using this method to perform HEP at home. Patient noted to have increased sway with standing balance activities requiring CGA and patient reporting increase in her symptoms with these activities. Patient required seated rest break.  Slow Marching: Patient performed on firm surface slow marching with eyes  open with initially 2 hand support, progressing to one hand support and then 3-4 fingers of one hand  support 2 sets of 10 reps each leg with standing rest break between reps. Patient required CGA.        PT Education - 05/16/16 1123    Education provided Yes   Education Details Reviewed VOR x 1 exercise and added additional exercises to the HEP.    Person(s) Educated Patient   Methods Explanation;Demonstration;Verbal cues;Handout   Comprehension Verbalized understanding;Returned demonstration             PT Long Term Goals - 05/09/16 1130      PT LONG TERM GOAL #1   Title Patient will be able to perform home program independently for self-management.   Time 8   Period Weeks   Status New     PT LONG TERM GOAL #2   Title Patient will reduce falls risk as indicated by Activities Specific Balance Confidence Scale (ABC) >67% by 07/04/2016.   Baseline scored 63% on 05/09/2016   Time 8   Period Weeks   Status New     PT LONG TERM GOAL #3   Title Patient will demonstrate reduced falls risk as evidenced by Dynamic Gait Index (DGI) >19/24 by 07/04/2016.   Baseline scored 13/24 on 05/09/2016   Time 8   Period Weeks   Status New               Plan - 05/16/16 1123    Clinical Impression Statement Patient challenged by balance and vestibular activities this date including feet together and semi-tandem stance with head turns. Patient stating that she was thinking of purchasing a rollator walker. Discussed rollator walkers with patient and patient in agreement to practice with a rollator walker in the clinic next visit to help determine if she is safe to use rollator walker. Patient provided with additonal exercises for home exercise program. Patient encouraged to follow-up as indicated.    Rehab Potential Fair   Clinical Impairments Affecting Rehab Potential Positive Indicators: motivated, reports balance improved with HHPT   Negative Indicators: age, chronicity   PT Frequency 1x  / week   PT Duration 8 weeks   PT Treatment/Interventions Canalith Repostioning;Gait training;Stair training;Therapeutic exercise;Balance training;Neuromuscular re-education;Therapeutic activities;Patient/family education;Vestibular   PT Next Visit Plan review HEP and work on progressions. Practice with ROLLATOR as patient stating that she was wanting to get one- assess for safety with rollator, Do Dix-Hallpike testing, VOR x1 with conflicting background   PT Home Exercise Plan VOR X 1 in sitting 1 minute reps; feet together progressions with and without horizontal and vertical, semi-tandem progressions with and without horizontal head turns, slow marching;    Consulted and Agree with Plan of Care Patient      Patient will benefit from skilled therapeutic intervention in order to improve the following deficits and impairments:  Decreased balance, Decreased safety awareness, Difficulty walking, Decreased strength, Dizziness  Visit Diagnosis: Dizziness  and giddiness  Unsteady gait  Muscle weakness (generalized)     Problem List There are no active problems to display for this patient.  Mardelle Matte PT, DPT Mardelle Matte 05/16/2016, 2:54 PM  [facility] MAIN [facility] SERVICES [address] [place], [place], [address] Phone: [phone]   Fax:  [phone]  Name: Susan [name] MRN: [id number] Date of Birth: [birth year]

## 2016-05-23 ENCOUNTER — Encounter: Payer: Self-pay | Admitting: Physical Therapy

## 2016-05-23 ENCOUNTER — Ambulatory Visit: Payer: Medicare Other | Admitting: Physical Therapy

## 2016-05-23 DIAGNOSIS — R42 Dizziness and giddiness: Secondary | ICD-10-CM

## 2016-05-23 DIAGNOSIS — M6281 Muscle weakness (generalized): Secondary | ICD-10-CM

## 2016-05-23 DIAGNOSIS — R2681 Unsteadiness on feet: Secondary | ICD-10-CM

## 2016-05-23 NOTE — Therapy (Signed)
[place] [facility] MAIN [facility] SERVICES [address] [place], [place], [address] Phone: [phone]   Fax:  [phone]  Physical Therapy Treatment  Patient Details  Name: Susan [name] MRN: [id number] Date of Birth: Jul 17, 1926 Referring Provider: Dr. Malvin Johns  Encounter Date: 05/23/2016      PT End of Session - 05/23/16 1121    Visit Number 3   Number of Visits 9   Date for PT Re-Evaluation 07/04/16   Authorization Time Period Needs G codes  1/10   PT Start Time 1116   PT Stop Time 1202   PT Time Calculation (min) 46 min   Equipment Utilized During Treatment Gait belt   Activity Tolerance Patient tolerated treatment well   Behavior During Therapy [facility] for tasks assessed/performed      Past Medical History:  Diagnosis Date  . Anemia   . Balance problem   . Diabetes mellitus without complication (HCC)   . Diverticulosis   . Heart murmur   . Hypercholesteremia   . Hypertension   . Stroke [facility])    TIA 2006  . TIA (transient ischemic attack) 2006    Past Surgical History:  Procedure Laterality Date  . CATARACT EXTRACTION W/PHACO Left 05/22/2015   Procedure: CATARACT EXTRACTION PHACO AND INTRAOCULAR LENS PLACEMENT (IOC);  Surgeon: Galen Manila, MD;  Location: [facility] ORS;  Service: Ophthalmology;  Laterality: Left;  [place]: 01:07.9 AP%: 23.0 CDE: 15.62 Lot# [id number]  . CATARACT EXTRACTION W/PHACO Right 06/12/2015   Procedure: CATARACT EXTRACTION PHACO AND INTRAOCULAR LENS PLACEMENT (IOC);  Surgeon: Galen Manila, MD;  Location: [facility] ORS;  Service: Ophthalmology;  Laterality: Right;  [place] 00:57 AP% 17.1 CDE 9.86 fluid pack lot # [id number]  . COLON SURGERY     resection  . ECTOPIC PREGNANCY SURGERY    . HERNIA REPAIR    . TONSILLECTOMY      There were no vitals filed for this visit.      Subjective Assessment - 05/23/16 1120    Subjective Patient states she is doing pretty good and states she is still feeling a little dizzy. Patient states  she has been doing the exercises and that she has difficulty with the exercises standing on a pillow.    Pertinent History Patient has some difficulty reporting her past medical history and description of current problem and no family present. Patient states about 9 years ago she suffered a TIA and states she has been off balance for quite some time. Patient states she started noticing dizziness after the TIA but states it is worse currently. Patient states after she had her last operation for a L CEA in 2017, she has noticed being more off balance and states as she has gotten older she feels her balance has worsened as well. Patient had a HHPT that worked with her for about 6 weeks after the surgery and states most of her exercises were seated exercises. Patient states she does not feel "as wobbly" after she completed HHPT.   Patient Stated Goals pt would like to be able to be steadier on her feet.   Currently in Pain? No/denies       Neuromuscular Re-education:  VOR x 1 exercise:  Patient performed VOR X 1 horiz in sitting 1 rep of 1 minute on plain background and 2 reps of 1 minute with conflicting background with verbal cues for technique as initially patient was turing her head without keeping her eyes on the target. Reviewed and demonstrated again on how  to perform VOR X 1 and then patient able to perform. Patient reported the target was moving a little with conflicting background but and denies any increase in her dizziness levels.   On firm surface exercises:  Patient demonstrated her home exercise program and patient required review and practice in order to perform exercises correctly as patient was holding with both hands and going too fast with the slow marching exercise and she needed cuing on speed of head movement with head turning exercises. Patient performed feet together progressions static stance on firm surface 1 reps with horizontal and then vertical head turns. Patient performed  semi-tandem stance progressions with alternate lead leg with horizontal and then vertical head turns with verbal cuing for foot placement and speed of head turns.   Ambulation with rollator walker:  Patient at last session reported that she was going to purchase a rollator walker but states she had never used one before. Demonstrated how to safely use rollator walker and discussed features of walker. Patient then ambulated with rollator walker about 500' navigating turns on tiled and carpeted floors. Patient required verbal cues for turning and to control speed using brakes. Discussed concerns with patient that  She appeared to have some difficulty with controlling the speed of the walker and patient was continually squeezing hand brakes to control speed on carpeted surface. Recommended that patient continue to use RW at this time.       PT Education - 05/23/16 1121    Education provided Yes   Education Details Spent increased time reviewing home exercise program with patient    Person(s) Educated Patient   Methods Explanation;Demonstration;Verbal cues   Comprehension Verbalized understanding;Returned demonstration;Verbal cues required             PT Long Term Goals - 05/09/16 1130      PT LONG TERM GOAL #1   Title Patient will be able to perform home program independently for self-management.   Time 8   Period Weeks   Status New     PT LONG TERM GOAL #2   Title Patient will reduce falls risk as indicated by Activities Specific Balance Confidence Scale (ABC) >67% by 07/04/2016.   Baseline scored 63% on 05/09/2016   Time 8   Period Weeks   Status New     PT LONG TERM GOAL #3   Title Patient will demonstrate reduced falls risk as evidenced by Dynamic Gait Index (DGI) >19/24 by 07/04/2016.   Baseline scored 13/24 on 05/09/2016   Time 8   Period Weeks   Status New               Plan - 05/23/16 1122    Clinical Impression Statement Patient reporting improvement in her  dizziness symptoms but states she continues to have difficulty with her balance. Patient reports that she has been doing her home exercise program but when patient demonstrated her home exercise program, patient had been doing majority of the exercises with incorrect technique. Reviewed all home exercises again with patient and patient able to demonstrate back exercises in the clinic. Patient would benefit from review of HEP again next session. Patient tried using a rollator walker this date and patient had difficulty at times with controlling walker speed as she was squeezing brakes continually. Therefore, recommend continued use of RW at this time. Patient would benefit from contined PT services to address goals and to try to reduce falls risk.    Rehab Potential Fair   Clinical Impairments  Affecting Rehab Potential Positive Indicators: motivated, reports balance improved with HHPT   Negative Indicators: age, chronicity   PT Frequency 1x / week   PT Duration 8 weeks   PT Treatment/Interventions Canalith Repostioning;Gait training;Stair training;Therapeutic exercise;Balance training;Neuromuscular re-education;Therapeutic activities;Patient/family education;Vestibular   PT Next Visit Plan review HEP and work on progressions. Do Dix-Hallpike testing if patient reporting dizziness; alternating toe taps, cone taps, ball circles   PT Home Exercise Plan VOR X 1 in sitting 1 minute reps with conflicting background; feet together progressions with and without horizontal and vertical, semi-tandem progressions with and without horizontal head turns, slow marching;    Consulted and Agree with Plan of Care Patient      Patient will benefit from skilled therapeutic intervention in order to improve the following deficits and impairments:  Decreased balance, Decreased safety awareness, Difficulty walking, Decreased strength, Dizziness  Visit Diagnosis: Dizziness and giddiness  Unsteady gait  Muscle weakness  (generalized)     Problem List There are no active problems to display for this patient.   Milady Fleener 05/23/2016, 2:19 PM  [facility] MAIN [facility] SERVICES [address] [place], [place], [address] Phone: [phone]   Fax:  [phone]  Name: SERRIA SLOMA MRN: [id number] Date of Birth: [birth year]

## 2016-06-06 ENCOUNTER — Encounter: Payer: Self-pay | Admitting: Physical Therapy

## 2016-06-06 ENCOUNTER — Ambulatory Visit: Payer: Medicare Other | Admitting: Physical Therapy

## 2016-06-06 DIAGNOSIS — M6281 Muscle weakness (generalized): Secondary | ICD-10-CM

## 2016-06-06 DIAGNOSIS — R2681 Unsteadiness on feet: Secondary | ICD-10-CM

## 2016-06-06 DIAGNOSIS — R42 Dizziness and giddiness: Secondary | ICD-10-CM | POA: Diagnosis not present

## 2016-06-06 NOTE — Therapy (Signed)
[place] MAIN [facility] SERVICES [address] [place], [place], [address] Phone: [phone]   Fax:  [phone]  Physical Therapy Treatment  Patient Details  Name: Susan [name] MRN: [id number] Date of Birth: August 03, 1926 Referring Provider: Dr. Melrose Nakayama  Encounter Date: 06/06/2016      PT End of Session - 06/06/16 1127    Visit Number 4   Number of Visits 9   Date for PT Re-Evaluation 07/04/16   Authorization Time Period Needs G codes  2022-08-21   PT Start Time 1120   PT Stop Time 1202   PT Time Calculation (min) 42 min   Equipment Utilized During Treatment Gait belt   Activity Tolerance Patient tolerated treatment well  one seated rest break secondary to leg fatigue   Behavior During Therapy [facility] for tasks assessed/performed      Past Medical History:  Diagnosis Date  . Anemia   . Balance problem   . Diabetes mellitus without complication ([place])   . Diverticulosis   . Heart murmur   . Hypercholesteremia   . Hypertension   . Stroke [facility])    TIA 2006  . TIA (transient ischemic attack) 2006    Past Surgical History:  Procedure Laterality Date  . CATARACT EXTRACTION W/PHACO Left 05/22/2015   Procedure: CATARACT EXTRACTION PHACO AND INTRAOCULAR LENS PLACEMENT (IOC);  Surgeon: Birder Robson, MD;  Location: [facility] ORS;  Service: Ophthalmology;  Laterality: Left;  [place]: 01:07.9 AP%: 23.0 CDE: 15.62 Lot# [id number]  . CATARACT EXTRACTION W/PHACO Right 06/12/2015   Procedure: CATARACT EXTRACTION PHACO AND INTRAOCULAR LENS PLACEMENT (IOC);  Surgeon: Birder Robson, MD;  Location: [facility] ORS;  Service: Ophthalmology;  Laterality: Right;  [place] 00:57 AP% 17.1 CDE 9.86 fluid pack lot # [id number]  . COLON SURGERY     resection  . ECTOPIC PREGNANCY SURGERY    . HERNIA REPAIR    . TONSILLECTOMY      There were no vitals filed for this visit.      Subjective Assessment - 06/06/16 1125    Subjective Patient states she is feeling fine and feels her  balance is about the same. Patient states "I think I have to learn how to live with it." Patient reports that she has been trying to do her home exercise program and states the heel toe exercise is still the hardest one.    Pertinent History Patient has some difficulty reporting her past medical history and description of current problem and no family present. Patient states about 9 years ago she suffered a TIA and states she has been off balance for quite some time. Patient states she started noticing dizziness after the TIA but states it is worse currently. Patient states after she had her last operation for a L CEA in 2017, she has noticed being more off balance and states as she has gotten older she feels her balance has worsened as well. Patient had a HHPT that worked with her for about 6 weeks after the surgery and states most of her exercises were seated exercises. Patient states she does not feel "as wobbly" after she completed HHPT.   Patient Stated Goals pt would like to be able to be steadier on her feet.   Currently in Pain? No/denies      Neuromuscular Re-education:  VOR X 1: Patient performed VOR X 1 horiz in sitting 3 reps of 1 minute each with conflicting background. Patient denies dizziness with exercise and reports target is staying in focus.  Balance Pod tapping: On firm surface, patient performed alternate balance pod foot tapping in series of one and two targets as called out by therapist. Patient progressed from one hand support to intermittent few finger support of one hand. Patient required CGA with this activity.   Ball circles: Worked on standing with one foot on ball while doing clockwise rolls multiple reps each foot with faded UEs support. Performed multiple sets of this exercise. Patient progressed from bilateral UEs support to UE support of one hand/ few fingers on // bar. Patient with lateral swaying with this activity and occasional loss of balance posterolaterally to  the right and left with CGA to correct loss of balance.   Step Taps: Patient performed alternating step taps on 5" wooden step with faded UEs support progressing to 2 finger support of one hand.   Reviewed HEP: Patient performed feet together and semi-tandem stance with alternate lead leg with horizontal head turns. Performed slow marching with 5 second holds and cued patient for proper technique. Patient requires few finger support of one hand with slow marching for balance.        PT Education - 06/06/16 1126    Education provided Yes   Education Details Reviewed HEP with patient added VOR X 1 wiht conflicting background progression   Person(s) Educated Patient   Methods Explanation;Demonstration   Comprehension Verbalized understanding;Returned demonstration             PT Long Term Goals - 06/06/16 1127      PT LONG TERM GOAL #1   Title Patient will be able to perform home program independently for self-management.   Time 8   Period Weeks   Status Partially Met     PT LONG TERM GOAL #2   Title Patient will reduce falls risk as indicated by Activities Specific Balance Confidence Scale (ABC) >67% by 07/04/2016.   Baseline scored 63% on 05/09/2016   Time 8   Period Weeks   Status On-going     PT LONG TERM GOAL #3   Title Patient will demonstrate reduced falls risk as evidenced by Dynamic Gait Index (DGI) >19/24 by 07/04/2016.   Baseline scored 13/24 on 05/09/2016   Time 8   Period Weeks   Status On-going               Plan - 06/06/16 1128    Clinical Impression Statement Patient reporting that she is getting dizziness occasionally but states when she gets dizziness it is usually in teh afternoon and it is lasting longer but no increase in intensity. Patient reports this coincided with starting to take her new medication and patient reports that she is going to follow-up with her physician. Patient consistently had challenges to her balance with performing balance  pod tapping, step taps and ball circles. Patient demonstrated improved retention of exercises this date but still required verbal cuing on how to perform slow marching and VOR X 1 correctly. Reinforeced HEP. Patient would benefit from continued PT services to further address functional deficits and goals.     Rehab Potential Fair   Clinical Impairments Affecting Rehab Potential Positive Indicators: motivated, reports balance improved with HHPT   Negative Indicators: age, chronicity   PT Frequency 1x / week   PT Duration 8 weeks   PT Treatment/Interventions Canalith Repostioning;Gait training;Stair training;Therapeutic exercise;Balance training;Neuromuscular re-education;Therapeutic activities;Patient/family education;Vestibular   PT Next Visit Plan review HEP and work on progressions. Do Dix-Hallpike testing if patient reporting dizziness; alternating toe taps, cone  taps, ball circles   PT Home Exercise Plan VOR X 1 in sitting 1 minute reps with conflicting background; feet together progressions with and without horizontal and vertical, semi-tandem progressions with and without horizontal head turns, slow marching;    Consulted and Agree with Plan of Care Patient      Patient will benefit from skilled therapeutic intervention in order to improve the following deficits and impairments:  Decreased balance, Decreased safety awareness, Difficulty walking, Decreased strength, Dizziness  Visit Diagnosis: Dizziness and giddiness  Unsteady gait  Muscle weakness (generalized)     Problem List There are no active problems to display for this patient.  Lady Deutscher PT, DPT Lady Deutscher 06/06/2016, 3:14 PM  [place] MAIN [facility] SERVICES [address] [place], [place], [address] Phone: [phone]   Fax:  [phone]  Name: Susan [name] MRN: [id number] Date of Birth: 1927/02/11

## 2016-06-13 ENCOUNTER — Ambulatory Visit: Payer: Medicare Other | Attending: Neurology

## 2016-06-13 ENCOUNTER — Encounter: Payer: Self-pay | Admitting: Physical Therapy

## 2016-06-13 VITALS — BP 165/76 | HR 94

## 2016-06-13 DIAGNOSIS — R42 Dizziness and giddiness: Secondary | ICD-10-CM | POA: Diagnosis present

## 2016-06-13 DIAGNOSIS — M6281 Muscle weakness (generalized): Secondary | ICD-10-CM | POA: Diagnosis present

## 2016-06-13 DIAGNOSIS — R2681 Unsteadiness on feet: Secondary | ICD-10-CM | POA: Diagnosis present

## 2016-06-13 NOTE — Therapy (Signed)
[place] MAIN [facility] SERVICES [address] [place], [place], [address] Phone: [phone]   Fax:  [phone]  Physical Therapy Treatment  Patient Details  Name: Susan [name] MRN: [id number] Date of Birth: [birth year] Referring Provider: Dr. Melrose Nakayama  Encounter Date: 06/13/2016      PT End of Session - 06/13/16 1131    Visit Number 5   Number of Visits 9   Date for PT Re-Evaluation 07/04/16   Authorization Time Period Needs G codes  5/10   PT Start Time 1125   PT Stop Time 1155   PT Time Calculation (min) 30 min   Equipment Utilized During Treatment Gait belt   Activity Tolerance Patient tolerated treatment well  one seated rest break secondary to leg fatigue   Behavior During Therapy [facility] for tasks assessed/performed      Past Medical History:  Diagnosis Date  . Anemia   . Balance problem   . Diabetes mellitus without complication ([place])   . Diverticulosis   . Heart murmur   . Hypercholesteremia   . Hypertension   . Stroke [facility])    TIA 2006  . TIA (transient ischemic attack) 2006    Past Surgical History:  Procedure Laterality Date  . CATARACT EXTRACTION W/PHACO Left 05/22/2015   Procedure: CATARACT EXTRACTION PHACO AND INTRAOCULAR LENS PLACEMENT (IOC);  Surgeon: Birder Robson, MD;  Location: [facility] ORS;  Service: Ophthalmology;  Laterality: Left;  [place]: 01:07.9 AP%: 23.0 CDE: 15.62 Lot# [id number]  . CATARACT EXTRACTION W/PHACO Right 06/12/2015   Procedure: CATARACT EXTRACTION PHACO AND INTRAOCULAR LENS PLACEMENT (IOC);  Surgeon: Birder Robson, MD;  Location: [facility] ORS;  Service: Ophthalmology;  Laterality: Right;  [place] 00:57 AP% 17.1 CDE 9.86 fluid pack lot # [id number]  . COLON SURGERY     resection  . ECTOPIC PREGNANCY SURGERY    . HERNIA REPAIR    . TONSILLECTOMY      Vitals:   06/13/16 1129  BP: (!) 165/76  Pulse: 94  SpO2: 100%        Subjective Assessment - 06/13/16 1128    Subjective Pt arrived late for her  appointment so visit abbreviated accordingly. No specific complaints at this time. Dizziness persists but currently denies any vertigo. Has not been consistent with her HEP as she is helping her daughter a lot.    Pertinent History Patient has some difficulty reporting her past medical history and description of current problem and no family present. Patient states about 9 years ago she suffered a TIA and states she has been off balance for quite some time. Patient states she started noticing dizziness after the TIA but states it is worse currently. Patient states after she had her last operation for a L CEA in 2017, she has noticed being more off balance and states as she has gotten older she feels her balance has worsened as well. Patient had a HHPT that worked with her for about 6 weeks after the surgery and states most of her exercises were seated exercises. Patient states she does not feel "as wobbly" after she completed HHPT.   Patient Stated Goals pt would like to be able to be steadier on her feet.   Currently in Pain? No/denies               Neuromuscular Re-education:   VOR X 1  Patient performed VOR X 1 horiz in sitting 3 reps of 1 minute each with conflicting background. Patient denies dizziness with exercise  and reports target is staying in focus. She requires tactile cues for cervical ROM and speed. She has to move relatively slowly to keep object in focus.   Reviewed HEP and Performed Patient performed feet together and semi-tandem stance with alternate lead leg with horizontal and vertical head turns.  Performed single leg balance practice with patient cued patient for proper technique. Patient requires few finger support of one hand as well as constant CGA with intermittent min/modA+1.   Balance Podtapping  On firm surface,?patient performed alternate balance pod foot tapping in series of one and two targets as called out by therapist. Patient progressed from one hand  support to intermittent few finger support of one hand. Patient required CGA with this activity.   Obstacle Course Obstacle course in // bars with 1/2 foam rollers, Airex pad, and 5" step in varying sequences. Pt requires constant CGA with min/modA+1 as well as intermittent UE support on // bars.                      PT Education - 06/13/16 1130    Education provided Yes   Education Details Importance of HEP reinforced   Person(s) Educated Patient   Methods Explanation   Comprehension Verbalized understanding             PT Long Term Goals - 06/06/16 1127      PT LONG TERM GOAL #1   Title Patient will be able to perform home program independently for self-management.   Time 8   Period Weeks   Status Partially Met     PT LONG TERM GOAL #2   Title Patient will reduce falls risk as indicated by Activities Specific Balance Confidence Scale (ABC) >67% by 07/04/2016.   Baseline scored 63% on 05/09/2016   Time 8   Period Weeks   Status On-going     PT LONG TERM GOAL #3   Title Patient will demonstrate reduced falls risk as evidenced by Dynamic Gait Index (DGI) >19/24 by 07/04/2016.   Baseline scored 13/24 on 05/09/2016   Time 8   Period Weeks   Status On-going               Plan - 06/13/16 1131    Clinical Impression Statement Pt continues to demonstrate very poor standing balance in narrow stance but particularly with head turns. She is not very consistent with her HEP so extensive encouragement and education provided to patient regarding education and safe performance of HEP. Pt encouraged to follow-up as scheduled.    Rehab Potential Fair   Clinical Impairments Affecting Rehab Potential Positive Indicators: motivated, reports balance improved with HHPT   Negative Indicators: age, chronicity   PT Frequency 1x / week   PT Duration 8 weeks   PT Treatment/Interventions Canalith Repostioning;Gait training;Stair training;Therapeutic exercise;Balance  training;Neuromuscular re-education;Therapeutic activities;Patient/family education;Vestibular   PT Next Visit Plan review HEP and work on progressions. Do Dix-Hallpike testing if patient reporting dizziness; alternating toe taps, cone taps, ball circles   PT Home Exercise Plan VOR X 1 in sitting 1 minute reps with conflicting background; feet together progressions with and without horizontal and vertical, semi-tandem progressions with and without horizontal head turns, slow marching;    Consulted and Agree with Plan of Care Patient      Patient will benefit from skilled therapeutic intervention in order to improve the following deficits and impairments:  Decreased balance, Decreased safety awareness, Difficulty walking, Decreased strength, Dizziness  Visit Diagnosis: Dizziness and  giddiness  Muscle weakness (generalized)     Problem List There are no active problems to display for this patient.  Phillips Grout PT, DPT   Huprich,Jason 06/13/2016, 12:03 PM  [place] MAIN [facility] SERVICES [address] [place], [place], [address] Phone: [phone]   Fax:  [phone]  Name: Susan [name] MRN: [id number] Date of Birth: 1926/04/19

## 2016-06-20 ENCOUNTER — Ambulatory Visit: Payer: Medicare Other | Admitting: Physical Therapy

## 2016-06-20 ENCOUNTER — Encounter: Payer: Self-pay | Admitting: Physical Therapy

## 2016-06-20 DIAGNOSIS — M6281 Muscle weakness (generalized): Secondary | ICD-10-CM

## 2016-06-20 DIAGNOSIS — R42 Dizziness and giddiness: Secondary | ICD-10-CM | POA: Diagnosis not present

## 2016-06-20 DIAGNOSIS — R2681 Unsteadiness on feet: Secondary | ICD-10-CM

## 2016-06-20 NOTE — Therapy (Signed)
[place] MAIN [facility] SERVICES [address] [place], [place], [address] Phone: [phone]   Fax:  [phone]  Physical Therapy Treatment  Patient Details  Name: Susan [name] MRN: [id number] Date of Birth: Oct 01, 1926 Referring Provider: Dr. Melrose Nakayama  Encounter Date: 06/20/2016      PT End of Session - 06/20/16 1145    Visit Number 6   Number of Visits 9   Date for PT Re-Evaluation 07/04/16   Authorization Time Period Needs G codes  6/10   PT Start Time 4742   PT Stop Time 1215   PT Time Calculation (min) 40 min   Equipment Utilized During Treatment Gait belt   Activity Tolerance Patient tolerated treatment well  one seated rest break secondary to leg fatigue   Behavior During Therapy [facility] for tasks assessed/performed      Past Medical History:  Diagnosis Date  . Anemia   . Balance problem   . Diabetes mellitus without complication ([place])   . Diverticulosis   . Heart murmur   . Hypercholesteremia   . Hypertension   . Stroke [facility])    TIA 2006  . TIA (transient ischemic attack) 2006    Past Surgical History:  Procedure Laterality Date  . CATARACT EXTRACTION W/PHACO Left 05/22/2015   Procedure: CATARACT EXTRACTION PHACO AND INTRAOCULAR LENS PLACEMENT (IOC);  Surgeon: Birder Robson, MD;  Location: [facility] ORS;  Service: Ophthalmology;  Laterality: Left;  [place]: 01:07.9 AP%: 23.0 CDE: 15.62 Lot# [id number]  . CATARACT EXTRACTION W/PHACO Right 06/12/2015   Procedure: CATARACT EXTRACTION PHACO AND INTRAOCULAR LENS PLACEMENT (IOC);  Surgeon: Birder Robson, MD;  Location: [facility] ORS;  Service: Ophthalmology;  Laterality: Right;  [place] 00:57 AP% 17.1 CDE 9.86 fluid pack lot # [id number]  . COLON SURGERY     resection  . ECTOPIC PREGNANCY SURGERY    . HERNIA REPAIR    . TONSILLECTOMY      There were no vitals filed for this visit.      Subjective Assessment - 06/20/16 1140    Subjective Patient states she is feeling "so-so" today. Patient  states that some days are worse than others and states she is usually fine when sitting down but when she is up and walking she gets dizzy and off-balance at times. Patient states she is living with her daughter and helping to take care of her currently. Patient states she is trying to do her exercises a little every day.    Pertinent History Patient has some difficulty reporting her past medical history and description of current problem and no family present. Patient states about 9 years ago she suffered a TIA and states she has been off balance for quite some time. Patient states she started noticing dizziness after the TIA but states it is worse currently. Patient states after she had her last operation for a L CEA in 2017, she has noticed being more off balance and states as she has gotten older she feels her balance has worsened as well. Patient had a HHPT that worked with her for about 6 weeks after the surgery and states most of her exercises were seated exercises. Patient states she does not feel "as wobbly" after she completed HHPT.   Patient Stated Goals pt would like to be able to be steadier on her feet.   Currently in Pain? No/denies      Neuromuscular Re-education:  Rockerboard: On small wooden rocker board, worked on side to side and anterior/posterior sways with  faded UEs support with assistance ranging from [place] to [place].  On small wooden rocker board, worked on sideways and anterior/posterior mid-way static hold positions with faded UEs support with CGA to Min A.   1/2 Foam Roll:  On 1/2 foam roll with flat side down and then round side down worked on static holds for grossly 5 minutes each. Patient requiring assistance ranging from CGA to mod/min A. Patient initially using reaching for support on // bars as primary strategy for loss of balance  Patient has difficulty utilizing ankle and hip strategies to help regain balance and primarily reaches for support with any balance  perturbation. With practice, patient was able to begin to use ankle strategies.    Walker Safety:  Discussed safety with use of walker with performing sit to stand transfers as patient tries to set the walker aside and then take several steps to turn to sit down. Re-instructed patient on keeping the walker with her as she backs up to chair, to reach back for sitting surface.  Orthostatic Strategies: Patient reports episodes of dizziness and light-headedness when moving quickly during transitional movements such as sit to stand. Discussed and demonstrated strategies to try to help minimize dizziness from position changes including maintaining adequate hydration, moving more slowly when transitioning and before standing performing sitting marches or LAQ and upon standing performing side to side sways or marching in place.        PT Education - 06/20/16 1143    Education provided Yes   Education Details Importance of HEP reinforced again   Person(s) Educated Patient   Methods Explanation   Comprehension Verbalized understanding             PT Long Term Goals - 06/06/16 1127      PT LONG TERM GOAL #1   Title Patient will be able to perform home program independently for self-management.   Time 8   Period Weeks   Status Partially Met     PT LONG TERM GOAL #2   Title Patient will reduce falls risk as indicated by Activities Specific Balance Confidence Scale (ABC) >67% by 07/04/2016.   Baseline scored 63% on 05/09/2016   Time 8   Period Weeks   Status On-going     PT LONG TERM GOAL #3   Title Patient will demonstrate reduced falls risk as evidenced by Dynamic Gait Index (DGI) >19/24 by 07/04/2016.   Baseline scored 13/24 on 05/09/2016   Time 8   Period Weeks   Status On-going               Plan - 06/20/16 1145    Clinical Impression Statement Patient demonstrating poor ankle and hip strategies with balance perturbations and relies heavily on UEs to regain balance with  challenges to her balance. Patient challenged by all balance activities this date. Patient continues to require cuing on safety with walker management as she tends to move walker too far in front of her when ambulating and tries to set the walker aside when trying to sit down. Safe use of walker was reinforced this date. Patient would benefit from continued PT services to try to address balance and dizziness deficits and to decrease falls risk.    Rehab Potential Fair   Clinical Impairments Affecting Rehab Potential Positive Indicators: motivated, reports balance improved with HHPT   Negative Indicators: age, chronicity   PT Frequency 1x / week   PT Duration 8 weeks   PT Treatment/Interventions Canalith Repostioning;Gait training;Stair  training;Therapeutic exercise;Balance training;Neuromuscular re-education;Therapeutic activities;Patient/family education;Vestibular   PT Next Visit Plan review HEP and work on progressions. alternating toe taps, cone taps, ball circles, continued work on ankle and hip strategy development.   PT Home Exercise Plan VOR X 1 in sitting 1 minute reps with conflicting background; feet together progressions with and without horizontal and vertical, semi-tandem progressions with and without horizontal head turns, slow marching;    Consulted and Agree with Plan of Care Patient      Patient will benefit from skilled therapeutic intervention in order to improve the following deficits and impairments:  Decreased balance, Decreased safety awareness, Difficulty walking, Decreased strength, Dizziness  Visit Diagnosis: Dizziness and giddiness  Muscle weakness (generalized)  Unsteady gait     Problem List There are no active problems to display for this patient.  Lady Deutscher PT, DPT Lady Deutscher 06/20/2016, 2:47 PM  [place] MAIN [facility] SERVICES [address] [place], [place], [address] Phone: [phone]   Fax:   [phone]  Name: Susan [name] MRN: [id number] Date of Birth: 03-08-1927

## 2016-06-27 ENCOUNTER — Ambulatory Visit: Payer: Medicare Other

## 2016-06-27 VITALS — BP 132/61 | HR 79

## 2016-06-27 DIAGNOSIS — R42 Dizziness and giddiness: Secondary | ICD-10-CM

## 2016-06-27 DIAGNOSIS — M6281 Muscle weakness (generalized): Secondary | ICD-10-CM

## 2016-06-27 DIAGNOSIS — R2681 Unsteadiness on feet: Secondary | ICD-10-CM

## 2016-06-27 NOTE — Therapy (Signed)
[place] MAIN [facility] SERVICES [address] [place], [place], [address] Phone: [phone]   Fax:  [phone]  Physical Therapy Treatment/Discharge  Patient Details  Name: Susan [name] MRN: [id number] Date of Birth: 14-Aug-1926 Referring Provider: Dr. Melrose Nakayama  Encounter Date: 06/27/2016      PT End of Session - 06/27/16 1147    Visit Number 7   Number of Visits 9   Date for PT Re-Evaluation 07/04/16   Authorization Time Period Needs G codes  7/10   PT Start Time 1140   PT Stop Time 1230   PT Time Calculation (min) 50 min   Equipment Utilized During Treatment Gait belt   Activity Tolerance Patient tolerated treatment well  one seated rest break secondary to leg fatigue   Behavior During Therapy [facility] for tasks assessed/performed      Past Medical History:  Diagnosis Date  . Anemia   . Balance problem   . Diabetes mellitus without complication ([place])   . Diverticulosis   . Heart murmur   . Hypercholesteremia   . Hypertension   . Stroke [facility])    TIA 2006  . TIA (transient ischemic attack) 2006    Past Surgical History:  Procedure Laterality Date  . CATARACT EXTRACTION W/PHACO Left 05/22/2015   Procedure: CATARACT EXTRACTION PHACO AND INTRAOCULAR LENS PLACEMENT (IOC);  Surgeon: Birder Robson, MD;  Location: [facility] ORS;  Service: Ophthalmology;  Laterality: Left;  [place]: 01:07.9 AP%: 23.0 CDE: 15.62 Lot# [id number]  . CATARACT EXTRACTION W/PHACO Right 06/12/2015   Procedure: CATARACT EXTRACTION PHACO AND INTRAOCULAR LENS PLACEMENT (IOC);  Surgeon: Birder Robson, MD;  Location: [facility] ORS;  Service: Ophthalmology;  Laterality: Right;  [place] 00:57 AP% 17.1 CDE 9.86 fluid pack lot # [id number]  . COLON SURGERY     resection  . ECTOPIC PREGNANCY SURGERY    . HERNIA REPAIR    . TONSILLECTOMY      Vitals:   06/27/16 1145  BP: 132/61  Pulse: 79  SpO2: 100%        Subjective Assessment - 06/27/16 1145    Subjective Pt reports she is  doing well at this time. She is continuing to care for her daughter which occupies a lot of her time. She currently reports performing HEP at least once/day but sometimes twice. No pain today and no specific questions or concerns. Denies changes in her health since last visit.    Pertinent History Patient has some difficulty reporting her past medical history and description of current problem and no family present. Patient states about 9 years ago she suffered a TIA and states she has been off balance for quite some time. Patient states she started noticing dizziness after the TIA but states it is worse currently. Patient states after she had her last operation for a L CEA in 2017, she has noticed being more off balance and states as she has gotten older she feels her balance has worsened as well. Patient had a HHPT that worked with her for about 6 weeks after the surgery and states most of her exercises were seated exercises. Patient states she does not feel "as wobbly" after she completed HHPT.   Patient Stated Goals pt would like to be able to be steadier on her feet.   Currently in Pain? No/denies            [facility] PT Assessment - 06/27/16 1154      Observation/Other Assessments   Other Surveys  Other Surveys  Activities of Balance Confidence Scale (ABC Scale)  61.25%   Dizziness Handicap Inventory [facility])  8/100     Standardized Balance Assessment   Standardized Balance Assessment Dynamic Gait Index     Dynamic Gait Index   Level Surface Moderate Impairment   Change in Gait Speed Mild Impairment   Gait with Horizontal Head Turns Mild Impairment   Gait with Vertical Head Turns Mild Impairment   Gait and Pivot Turn Moderate Impairment   Step Over Obstacle Moderate Impairment   Step Around Obstacles Mild Impairment   Steps Mild Impairment   Total Score 13        TREATMENT  Neuromuscular Re-education:  Outcome measures and Update Pt completed ABC=61.25% which is unchanged  from initial evaluation and DHI=8/100 (unbilled); Performed DGI with patient who scored 13/24 which is unchanged from initial evaluation Updated goals with patient; Reviewed HEP in detail with patient, pt able to recall all of her exercises without cues and accurately describe;  Orthostatic Strategies Reviewed and demonstrated strategies to try to help minimize dizziness from position changes including maintaining adequate hydration, moving more slowly when transitioning and before standing performing sitting marches, LAQ, elbow flexion/extension, and upon standing performing side to side sways or marching in place.   Walker Safety  Discussed safety with use of walker with performing sit to stand transfers as patient tries to set the walker aside and then take several steps to turn to sit down. Re-instructed patient on keeping the walker with her as she backs up to chair, to reach back for sitting surface.  Performed HEP with patient:   VOR X 1  Patient performed VOR X 1 in sitting 1 rep of 1 minute horizontal and 1 rep of 1 minute vertical. Reports 5/10 dizziness but poor historian and unclear if she accurately understands the scale; She requires verbal and tactile cues for cervical ROM and speed.  On firm surface On firm surface, patient performed feet together progressions (progressed to feet about 2" apart) with and without horiz and vert head turns and performed semi-tandem progressions with alternating lead leg with and without horiz head turns with CGA. Discussed importance of safety with HEP. Patient noted to have increased sway with standing balance activities requiring minA+1 and patient reporting increase in her symptoms with these activities.  Slow Marching: Patient performed on firm surface slow marching with eyes open with initially 2 hand support, progressing to one hand support and then no UE support, requires CGA to minA+1                          PT  Education - 06/27/16 1147    Education provided Yes   Education Details HEP reinforced, discharge instructions   Person(s) Educated Patient   Methods Explanation   Comprehension Verbalized understanding             PT Long Term Goals - 06/27/16 1148      PT LONG TERM GOAL #1   Title Patient will be able to perform home program independently for self-management.   Time 8   Period Weeks   Status Partially Met     PT LONG TERM GOAL #2   Title Patient will reduce falls risk as indicated by Activities Specific Balance Confidence Scale (ABC) >67% by 07/04/2016.   Baseline scored 63% on 05/09/2016; 06/27/16: 61.25%   Time 8   Period Weeks   Status Not Met     PT LONG TERM  GOAL #3   Title Patient will demonstrate reduced falls risk as evidenced by Dynamic Gait Index (DGI) >19/24 by 07/04/2016.   Baseline scored 13/24 on 05/09/2016; June 29, 2016:   Time 8   Period Weeks   Status Not Met               Plan - 06-29-16 1159    Clinical Impression Statement Patient has had very poor carryover between PT sessions with respect to correct exercise technique and performance. She does demonstrate decrease in her [place] today to 8/100 inidicating imporvement in her dizziness symptoms. Her overall balance however appears unchanged and is still poor. Her DGI is 13/24 which is unchanged from the initial evaluation. Her balance confidence is also unchanged with a score on ABC of 63% at initial evaluation and 61.25% today. She is able to recall her exercises however continues to require continual correction with respect to technique. Reinforced patient's home exercise program on this date and plan is for discharge. If patient is unable to maintain her home program or believes she is declining she was instructed to obtain another order to resume physical therapy.    Rehab Potential Fair   Clinical Impairments Affecting Rehab Potential Positive Indicators: motivated, reports balance improved with HHPT    Negative Indicators: age, chronicity   PT Frequency 1x / week   PT Duration 8 weeks   PT Treatment/Interventions Canalith Repostioning;Gait training;Stair training;Therapeutic exercise;Balance training;Neuromuscular re-education;Therapeutic activities;Patient/family education;Vestibular   PT Next Visit Plan Discharge   PT Home Exercise Plan VOR X 1 in sitting 1 minute reps with conflicting background; feet together progressions with and without horizontal and vertical, semi-tandem progressions with and without horizontal head turns, slow marching;    Consulted and Agree with Plan of Care Patient      Patient will benefit from skilled therapeutic intervention in order to improve the following deficits and impairments:  Decreased balance, Decreased safety awareness, Difficulty walking, Decreased strength, Dizziness  Visit Diagnosis: Dizziness and giddiness  Muscle weakness (generalized)  Unsteady gait       G-Codes - 2016/06/29 1150    Functional Assessment Tool Used (Outpatient Only) clincial judgment, DGI, DHI, ABC scale   Functional Limitation Mobility: Walking and moving around   Mobility: Walking and Moving Around Goal Status [id number]) At least 20 percent but less than 40 percent impaired, limited or restricted   Mobility: Walking and Moving Around Discharge Status [phone]) At least 20 percent but less than 40 percent impaired, limited or restricted      Problem List There are no active problems to display for this patient.  Phillips Grout PT, DPT   Huprich,Jason 06-29-16, [place] MAIN [facility] SERVICES [address] [place], [place], [address] Phone: [phone]   Fax:  [phone]  Name: Susan [name] MRN: [id number] Date of Birth: 07-31-1926

## 2016-07-08 ENCOUNTER — Encounter: Payer: Medicare Other | Admitting: Physical Therapy

## 2017-02-08 ENCOUNTER — Emergency Department: Payer: Medicare Other

## 2017-02-08 ENCOUNTER — Encounter: Payer: Self-pay | Admitting: Emergency Medicine

## 2017-02-08 ENCOUNTER — Emergency Department
Admission: EM | Admit: 2017-02-08 | Discharge: 2017-02-08 | Disposition: A | Discharge status: Home / Self Care | Payer: Medicare Other | Attending: Emergency Medicine | Admitting: Emergency Medicine

## 2017-02-08 DIAGNOSIS — Z79899 Other long term (current) drug therapy: Secondary | ICD-10-CM | POA: Insufficient documentation

## 2017-02-08 DIAGNOSIS — Y9389 Activity, other specified: Secondary | ICD-10-CM | POA: Diagnosis not present

## 2017-02-08 DIAGNOSIS — Z7984 Long term (current) use of oral hypoglycemic drugs: Secondary | ICD-10-CM | POA: Insufficient documentation

## 2017-02-08 DIAGNOSIS — Z8673 Personal history of transient ischemic attack (TIA), and cerebral infarction without residual deficits: Secondary | ICD-10-CM | POA: Diagnosis not present

## 2017-02-08 DIAGNOSIS — S0232XA Fracture of orbital floor, left side, initial encounter for closed fracture: Secondary | ICD-10-CM | POA: Insufficient documentation

## 2017-02-08 DIAGNOSIS — Y998 Other external cause status: Secondary | ICD-10-CM | POA: Insufficient documentation

## 2017-02-08 DIAGNOSIS — E119 Type 2 diabetes mellitus without complications: Secondary | ICD-10-CM | POA: Insufficient documentation

## 2017-02-08 DIAGNOSIS — S0993XA Unspecified injury of face, initial encounter: Secondary | ICD-10-CM | POA: Diagnosis present

## 2017-02-08 DIAGNOSIS — Z87891 Personal history of nicotine dependence: Secondary | ICD-10-CM | POA: Insufficient documentation

## 2017-02-08 DIAGNOSIS — I10 Essential (primary) hypertension: Secondary | ICD-10-CM | POA: Diagnosis not present

## 2017-02-08 DIAGNOSIS — Z7982 Long term (current) use of aspirin: Secondary | ICD-10-CM | POA: Insufficient documentation

## 2017-02-08 DIAGNOSIS — Y92099 Unspecified place in other non-institutional residence as the place of occurrence of the external cause: Secondary | ICD-10-CM | POA: Diagnosis not present

## 2017-02-08 DIAGNOSIS — W01198A Fall on same level from slipping, tripping and stumbling with subsequent striking against other object, initial encounter: Secondary | ICD-10-CM | POA: Insufficient documentation

## 2017-02-08 MED ORDER — CEPHALEXIN 500 MG PO CAPS: 500.0000 mg | ORAL_CAPSULE | Freq: Two times a day (BID) | ORAL | 0 refills | Status: DC

## 2017-02-08 NOTE — ED Notes (Signed)
ED Provider at bedside. 

## 2017-02-08 NOTE — ED Triage Notes (Signed)
Pt arrived via POV- drove herself. Pt states she was vacuuming on Friday and tripped on the cord, pt states she fell on the left side of her face. Pt states Friday night into Saturday she developed numbnesss to the left face from the nose to her cheek and is not improving. Pt states when she pushes on her cheek it hurts.  Left cheek under eye is ecchymotic.  Pt is ambulatory with a walker, denies any other numbness or weakness.  Pt has hx of TIA. No changes speech and no other obvious neuro symptoms

## 2017-02-08 NOTE — Discharge Instructions (Signed)
Please take antibiotic as prescribed.  Please follow-up with the ENT by calling the number provided to arrange a follow-up appointment in the next 1-2 weeks.  Return to the emergency department for any worsening pain, visual changes, or any other symptom personally concerning to yourself.

## 2017-02-08 NOTE — ED Provider Notes (Signed)
[facility] Emergency Department Provider Note  Time seen: 7:59 AM  I have reviewed the triage vital signs and the nursing notes.   HISTORY  Chief Complaint Fall    HPI Susan [name] is a 83 y.o. female with a past medical history of diabetes, hypertension, hyperlipidemia, TIA, presents to the emergency department after a fall.  According to the patient 2 days ago she was vacuuming and she tripped on the vacuum cord falling hitting the left side of her face on the floor.  Denies LOC.  Patient states some mild pain to the area but over the past 2 days it has developed ecchymosis and some slight numbness to the left cheek.  Denies any nausea or vomiting.  Denies any neck pain or back pain.  Patient has been ambulatory without issue.  Past Medical History:  Diagnosis Date  . Anemia   . Balance problem   . Diabetes mellitus without complication (HCC)   . Diverticulosis   . Heart murmur   . Hypercholesteremia   . Hypertension   . Stroke [facility](HCC)    TIA 2006  . TIA (transient ischemic attack) 2006    There are no active problems to display for this patient.   Past Surgical History:  Procedure Laterality Date  . CATARACT EXTRACTION W/PHACO Left 05/22/2015   Procedure: CATARACT EXTRACTION PHACO AND INTRAOCULAR LENS PLACEMENT (IOC);  Surgeon: Galen ManilaWilliam Porfilio, MD;  Location: [facility] ORS;  Service: Ophthalmology;  Laterality: Left;  US: 01:07.9 AP%: 23.0 CDE: 15.62 Lot# [id number]  . CATARACT EXTRACTION W/PHACO Right 06/12/2015   Procedure: CATARACT EXTRACTION PHACO AND INTRAOCULAR LENS PLACEMENT (IOC);  Surgeon: Galen ManilaWilliam Porfilio, MD;  Location: [facility] ORS;  Service: Ophthalmology;  Laterality: Right;  US 00:57 AP% 17.1 CDE 9.86 fluid pack lot # [id number]  . COLON SURGERY     resection  . ECTOPIC PREGNANCY SURGERY    . HERNIA REPAIR    . TONSILLECTOMY      Prior to Admission medications   Medication Sig Start Date End Date Taking? Authorizing Provider  amLODipine  (NORVASC) 10 MG tablet Take 10 mg by mouth daily before supper.    [provider]  aspirin 81 MG tablet Take 81 mg by mouth every morning.    [provider]  atorvastatin (LIPITOR) 20 MG tablet Take 20 mg by mouth daily after supper.    [provider]  cephALEXin (KEFLEX) 500 MG capsule Take 500 mg by mouth every morning.    [provider]  glipiZIDE (GLUCOTROL XL) 2.5 MG 24 hr tablet Take 2.5 mg by mouth daily with breakfast.    [provider]  linagliptin (TRADJENTA) 5 MG TABS tablet Take 5 mg by mouth daily.    [provider]  metFORMIN (GLUCOPHAGE) 500 MG tablet Take 500 mg by mouth daily with breakfast.     [provider]  oxybutynin (DITROPAN-XL) 10 MG 24 hr tablet Take 10 mg by mouth every morning.    [provider]  quinapril (ACCUPRIL) 40 MG tablet Take 40 mg by mouth daily. Breakfast and dinner.    [provider]    Allergies  Allergen Reactions  . Atenolol Other (See Comments)    "affected my heart rate"  . Naprosyn [Naproxen] Other (See Comments)    GI Bleed    No family history on file.  Social History Social History  Substance Use Topics  . Smoking status: Former Games developermoker  . Smokeless tobacco: Former NeurosurgeonUser  . Alcohol  use No    Review of Systems Constitutional: Negative for loss of consciousness Cardiovascular: Negative for chest pain. Respiratory: Negative for shortness of breath. Gastrointestinal: Negative for abdominal pain Musculoskeletal: Negative for back pain.  Negative for neck pain Neurological: Negative for headaches, focal weakness.  States mild numbness to the left cheek All other ROS negative  ____________________________________________   PHYSICAL EXAM:  VITAL SIGNS: ED Triage Vitals  Enc Vitals Group     BP 02/08/17 0735 (!) 196/69     Pulse Rate 02/08/17 0735 (!) 104     Resp 02/08/17 0735 18     Temp 02/08/17 0735 97.7 F (36.5 C)     Temp Source  02/08/17 0735 Oral     SpO2 02/08/17 0735 100 %     Weight 02/08/17 0739 122 lb (55.3 kg)     Height 02/08/17 0739 5\' 7"  (1.702 m)     Head Circumference --      Peak Flow --      Pain Score 02/08/17 0738 0     Pain Loc --      Pain Edu? --      Excl. in GC? --    Constitutional: Alert and oriented. Well appearing and in no distress. Eyes: Normal exam, EOMI, no signs of entrapment ENT   Head: Patient has mild ecchymosis below the left eye extending down the left cheek.  Mild tenderness to this area.  Normal-appearing nose, no septal hematoma.   Mouth/Throat: Mucous membranes are moist.  No intraoral injuries identified. Cardiovascular: Normal rate, regular rhythm. No murmur Respiratory: Normal respiratory effort without tachypnea nor retractions. Breath sounds are clear.  Gastrointestinal: Soft and nontender. No distention.   Musculoskeletal: Nontender with normal range of motion in all extremities.  Extremities are atraumatic. Neurologic:  Normal speech and language. No gross focal neurologic deficits  Skin:  Skin is warm, dry Psychiatric: Mood and affect are normal.   ____________________________________________   RADIOLOGY  CT scan shows fractures of the left orbit left maxillary sinus and right nasal bone.  ____________________________________________   INITIAL IMPRESSION / ASSESSMENT AND PLAN / ED COURSE  Pertinent labs & imaging results that were available during my care of the patient were reviewed by me and considered in my medical decision making (see chart for details).  Patient presents to the emergency department with facial pain after a fall 2 days ago.  Patient does have tenderness and ecchymosis to the left cheek.  We will obtain CT scans of the head and face to further evaluate.  Overall the patient appears extremely well, no distress.  CT scan of the head is negative.  CT scan of the face shows a fracture through the left orbit, several to the left  maxillary sinus and possibly to the right nasal bone although the patient states she had previously broken her nose years ago.  I believe the patient is safe for discharge home.  We will have the patient follow-up with ENT for further evaluation.  Patient agreeable to this plan of care.  Patient's exam is largely normal she does state some mild tingling over the left cheek/face, eyes normal extraocular muscles are intact with no signs of entrapment.  ____________________________________________   FINAL CLINICAL IMPRESSION(S) / ED DIAGNOSES  Fall Facial pain Facial fractures   Minna Antis, MD 02/08/17 [phone]

## 2017-07-04 ENCOUNTER — Encounter: Payer: Self-pay | Admitting: Emergency Medicine

## 2017-07-04 ENCOUNTER — Emergency Department: Payer: Medicare Other

## 2017-07-04 ENCOUNTER — Other Ambulatory Visit: Payer: Self-pay

## 2017-07-04 ENCOUNTER — Observation Stay
Admission: EM | Admit: 2017-07-04 | Discharge: 2017-07-06 | Disposition: A | Discharge status: Home / Self Care | Payer: Medicare Other | Attending: Internal Medicine | Admitting: Internal Medicine

## 2017-07-04 DIAGNOSIS — Z7984 Long term (current) use of oral hypoglycemic drugs: Secondary | ICD-10-CM | POA: Diagnosis not present

## 2017-07-04 DIAGNOSIS — Z888 Allergy status to other drugs, medicaments and biological substances status: Secondary | ICD-10-CM | POA: Diagnosis not present

## 2017-07-04 DIAGNOSIS — E1151 Type 2 diabetes mellitus with diabetic peripheral angiopathy without gangrene: Secondary | ICD-10-CM | POA: Diagnosis not present

## 2017-07-04 DIAGNOSIS — Z886 Allergy status to analgesic agent status: Secondary | ICD-10-CM | POA: Diagnosis not present

## 2017-07-04 DIAGNOSIS — Z8719 Personal history of other diseases of the digestive system: Secondary | ICD-10-CM | POA: Insufficient documentation

## 2017-07-04 DIAGNOSIS — Z79899 Other long term (current) drug therapy: Secondary | ICD-10-CM | POA: Diagnosis not present

## 2017-07-04 DIAGNOSIS — Z87891 Personal history of nicotine dependence: Secondary | ICD-10-CM | POA: Insufficient documentation

## 2017-07-04 DIAGNOSIS — N390 Urinary tract infection, site not specified: Secondary | ICD-10-CM | POA: Insufficient documentation

## 2017-07-04 DIAGNOSIS — R011 Cardiac murmur, unspecified: Secondary | ICD-10-CM | POA: Diagnosis not present

## 2017-07-04 DIAGNOSIS — R29898 Other symptoms and signs involving the musculoskeletal system: Principal | ICD-10-CM | POA: Insufficient documentation

## 2017-07-04 DIAGNOSIS — Z8673 Personal history of transient ischemic attack (TIA), and cerebral infarction without residual deficits: Secondary | ICD-10-CM | POA: Diagnosis not present

## 2017-07-04 DIAGNOSIS — E7800 Pure hypercholesterolemia, unspecified: Secondary | ICD-10-CM | POA: Diagnosis not present

## 2017-07-04 DIAGNOSIS — I10 Essential (primary) hypertension: Secondary | ICD-10-CM | POA: Diagnosis not present

## 2017-07-04 DIAGNOSIS — I639 Cerebral infarction, unspecified: Secondary | ICD-10-CM

## 2017-07-04 DIAGNOSIS — I351 Nonrheumatic aortic (valve) insufficiency: Secondary | ICD-10-CM | POA: Diagnosis not present

## 2017-07-04 DIAGNOSIS — Z7982 Long term (current) use of aspirin: Secondary | ICD-10-CM | POA: Diagnosis not present

## 2017-07-04 LAB — COMPREHENSIVE METABOLIC PANEL
ALK PHOS: 61 U/L (ref 38–126)
ALT: 20 U/L (ref 14–54)
ANION GAP: 8 (ref 5–15)
AST: 32 U/L (ref 15–41)
Albumin: 4.4 g/dL (ref 3.5–5.0)
BUN: 20 mg/dL (ref 6–20)
CALCIUM: 9.9 mg/dL (ref 8.9–10.3)
CHLORIDE: 106 mmol/L (ref 101–111)
CO2: 25 mmol/L (ref 22–32)
CREATININE: 0.68 mg/dL (ref 0.44–1.00)
Glucose, Bld: 168 mg/dL — ABNORMAL HIGH (ref 65–99)
Potassium: 4.2 mmol/L (ref 3.5–5.1)
SODIUM: 139 mmol/L (ref 135–145)
Total Bilirubin: 0.5 mg/dL (ref 0.3–1.2)
Total Protein: 9 g/dL — ABNORMAL HIGH (ref 6.5–8.1)

## 2017-07-04 LAB — CBC
HCT: 37.6 % (ref 35.0–47.0)
HEMOGLOBIN: 11.8 g/dL — AB (ref 12.0–16.0)
MCH: 25.1 pg — ABNORMAL LOW (ref 26.0–34.0)
MCHC: 31.4 g/dL — AB (ref 32.0–36.0)
MCV: 79.9 fL — ABNORMAL LOW (ref 80.0–100.0)
Platelets: 222 10*3/uL (ref 150–440)
RBC: 4.71 MIL/uL (ref 3.80–5.20)
RDW: 16.5 % — ABNORMAL HIGH (ref 11.5–14.5)
WBC: 6.5 10*3/uL (ref 3.6–11.0)

## 2017-07-04 LAB — APTT: APTT: 32 s (ref 24–36)

## 2017-07-04 LAB — DIFFERENTIAL
BASOS PCT: 1 %
Basophils Absolute: 0.1 10*3/uL (ref 0–0.1)
EOS PCT: 1 %
Eosinophils Absolute: 0.1 10*3/uL (ref 0–0.7)
Lymphocytes Relative: 18 %
Lymphs Abs: 1.2 10*3/uL (ref 1.0–3.6)
MONOS PCT: 7 %
Monocytes Absolute: 0.5 10*3/uL (ref 0.2–0.9)
NEUTROS ABS: 4.8 10*3/uL (ref 1.4–6.5)
Neutrophils Relative %: 73 %

## 2017-07-04 LAB — GLUCOSE, CAPILLARY: Glucose-Capillary: 120 mg/dL — ABNORMAL HIGH (ref 65–99)

## 2017-07-04 LAB — PROTIME-INR
INR: 0.99
Prothrombin Time: 13 seconds (ref 11.4–15.2)

## 2017-07-04 LAB — TROPONIN I

## 2017-07-04 MED ORDER — INSULIN ASPART 100 UNIT/ML ~~LOC~~ SOLN
0.0000 [IU] | Freq: Every day | SUBCUTANEOUS | Status: DC
  Administered 2017-07-05: 23:00:00 3 [IU] via SUBCUTANEOUS
  Filled 2017-07-04: qty 1

## 2017-07-04 MED ORDER — ACETAMINOPHEN 325 MG PO TABS: 650.0000 mg | ORAL_TABLET | ORAL | Status: DC | PRN

## 2017-07-04 MED ORDER — ASPIRIN 325 MG PO TABS
325.0000 mg | ORAL_TABLET | Freq: Every day | ORAL | Status: DC
  Administered 2017-07-05 – 2017-07-06 (×2): 325 mg via ORAL
  Filled 2017-07-04 (×5): qty 1

## 2017-07-04 MED ORDER — ACETAMINOPHEN 650 MG RE SUPP: 650.0000 mg | RECTAL | Status: DC | PRN

## 2017-07-04 MED ORDER — ATORVASTATIN CALCIUM 20 MG PO TABS
20.0000 mg | ORAL_TABLET | Freq: Every day | ORAL | Status: DC
  Administered 2017-07-04 – 2017-07-05 (×2): 20 mg via ORAL
  Filled 2017-07-04 (×2): qty 1

## 2017-07-04 MED ORDER — CALCIUM CARBONATE-VITAMIN D 500-200 MG-UNIT PO TABS
2.0000 | ORAL_TABLET | Freq: Two times a day (BID) | ORAL | Status: DC
  Administered 2017-07-04 – 2017-07-06 (×4): 2 via ORAL
  Filled 2017-07-04 (×4): qty 2

## 2017-07-04 MED ORDER — QUINAPRIL HCL 10 MG PO TABS
40.0000 mg | ORAL_TABLET | Freq: Two times a day (BID) | ORAL | Status: DC
  Administered 2017-07-04 – 2017-07-06 (×4): 40 mg via ORAL
  Filled 2017-07-04 (×5): qty 4

## 2017-07-04 MED ORDER — AMLODIPINE BESYLATE 10 MG PO TABS
10.0000 mg | ORAL_TABLET | Freq: Every day | ORAL | Status: DC
  Administered 2017-07-04 – 2017-07-05 (×2): 10 mg via ORAL
  Filled 2017-07-04 (×2): qty 1

## 2017-07-04 MED ORDER — ADULT MULTIVITAMIN W/MINERALS CH
1.0000 | ORAL_TABLET | Freq: Every day | ORAL | Status: DC
  Administered 2017-07-05 – 2017-07-06 (×2): 1 via ORAL
  Filled 2017-07-04 (×3): qty 1

## 2017-07-04 MED ORDER — ACETAMINOPHEN 160 MG/5ML PO SOLN
650.0000 mg | ORAL | Status: DC | PRN
  Filled 2017-07-04: qty 20.3

## 2017-07-04 MED ORDER — ASPIRIN 81 MG PO CHEW
324.0000 mg | CHEWABLE_TABLET | Freq: Once | ORAL | Status: AC
  Administered 2017-07-04: 324 mg via ORAL
  Filled 2017-07-04: qty 4

## 2017-07-04 MED ORDER — VITAMIN C 500 MG PO TABS
500.0000 mg | ORAL_TABLET | Freq: Every evening | ORAL | Status: DC
  Administered 2017-07-04 – 2017-07-05 (×2): 500 mg via ORAL
  Filled 2017-07-04 (×3): qty 1

## 2017-07-04 MED ORDER — INSULIN ASPART 100 UNIT/ML ~~LOC~~ SOLN
0.0000 [IU] | Freq: Three times a day (TID) | SUBCUTANEOUS | Status: DC
  Administered 2017-07-05: 12:00:00 3 [IU] via SUBCUTANEOUS
  Administered 2017-07-05 – 2017-07-06 (×2): 2 [IU] via SUBCUTANEOUS
  Administered 2017-07-06: 3 [IU] via SUBCUTANEOUS
  Filled 2017-07-04 (×4): qty 1

## 2017-07-04 MED ORDER — ASPIRIN 300 MG RE SUPP: 300.0000 mg | Freq: Every day | RECTAL | Status: DC

## 2017-07-04 MED ORDER — STROKE: EARLY STAGES OF RECOVERY BOOK
Freq: Once | Status: AC
  Administered 2017-07-04: 21:00:00

## 2017-07-04 NOTE — ED Provider Notes (Signed)
[facility] Emergency Department Provider Note   ____________________________________________   First MD Initiated Contact with Patient 07/04/17 1827     (approximate)  I have reviewed the triage vital signs and the nursing notes.   HISTORY  Chief Complaint Extremity Weakness    HPI Susan [name] is a 84 y.o. female here for evaluation of right hand weakness and numbness.  11 PM last known well, got up this morning and noticed holding a glass of apple juice felt strange in her hand seemed to be weak, also has decreased sensation over the right arm since this morning.  No trouble speaking.  No pain.  No weakness in her legs or face.  Denies headache.  Does take baby aspirin daily.  Reports a previous mini stroke in about 2005 2006.  No chest pain or trouble breathing.  Does have difficulty with gait, denies change with that and uses a walker at baseline.  Also history of hypertension.  Is taking all of her morning medications.  Past Medical History:  Diagnosis Date  . Anemia   . Balance problem   . Diabetes mellitus without complication (HCC)   . Diverticulosis   . Heart murmur   . Hypercholesteremia   . Hypertension   . Stroke [facility](HCC)    TIA 2006  . TIA (transient ischemic attack) 2006    There are no active problems to display for this patient.   Past Surgical History:  Procedure Laterality Date  . CATARACT EXTRACTION W/PHACO Left 05/22/2015   Procedure: CATARACT EXTRACTION PHACO AND INTRAOCULAR LENS PLACEMENT (IOC);  Surgeon: Galen ManilaWilliam Porfilio, MD;  Location: [facility] ORS;  Service: Ophthalmology;  Laterality: Left;  US: 01:07.9 AP%: 23.0 CDE: 15.62 Lot# [id number]  . CATARACT EXTRACTION W/PHACO Right 06/12/2015   Procedure: CATARACT EXTRACTION PHACO AND INTRAOCULAR LENS PLACEMENT (IOC);  Surgeon: Galen ManilaWilliam Porfilio, MD;  Location: [facility] ORS;  Service: Ophthalmology;  Laterality: Right;  US 00:57 AP% 17.1 CDE 9.86 fluid pack lot # [id number]  . COLON  SURGERY     resection  . ECTOPIC PREGNANCY SURGERY    . HERNIA REPAIR    . TONSILLECTOMY      Prior to Admission medications   Medication Sig Start Date End Date Taking? Authorizing Provider  amLODipine (NORVASC) 10 MG tablet Take 10 mg by mouth daily before supper.    [provider]  aspirin 81 MG tablet Take 81 mg by mouth every morning.    [provider]  atorvastatin (LIPITOR) 20 MG tablet Take 20 mg by mouth daily after supper.    [provider]  cephALEXin (KEFLEX) 500 MG capsule Take 1 capsule (500 mg total) by mouth 2 (two) times daily. 02/08/17   Minna AntisPaduchowski, Kevin, MD  glipiZIDE (GLUCOTROL XL) 2.5 MG 24 hr tablet Take 2.5 mg by mouth daily with breakfast.    [provider]  linagliptin (TRADJENTA) 5 MG TABS tablet Take 5 mg by mouth daily.    [provider]  metFORMIN (GLUCOPHAGE) 500 MG tablet Take 500 mg by mouth daily with breakfast.     [provider]  oxybutynin (DITROPAN-XL) 10 MG 24 hr tablet Take 10 mg by mouth every morning.    [provider]  quinapril (ACCUPRIL) 40 MG tablet Take 40 mg by mouth daily. Breakfast and dinner.    [provider]    Allergies Atenolol and Naprosyn [naproxen]  No family history on file.  Social History Social History   Tobacco Use  .  Smoking status: Former Games developer  . Smokeless tobacco: Former Engineer, water Use Topics  . Alcohol use: No  . Drug use: Not on file    Review of Systems Constitutional: No fever/chills Eyes: No visual changes. ENT: No sore throat. Cardiovascular: Denies chest pain. Respiratory: Denies shortness of breath. Gastrointestinal: No abdominal pain.  No nausea, no vomiting.  No diarrhea.  No constipation. Genitourinary: Negative for dysuria. Musculoskeletal: Negative for back pain. Skin: Negative for rash. Neurological: Negative for headaches.  No neck pain.    ____________________________________________   PHYSICAL  EXAM:  VITAL SIGNS: ED Triage Vitals  Enc Vitals Group     BP 07/04/17 1535 (!) 183/62     Pulse Rate 07/04/17 1535 82     Resp 07/04/17 1535 16     Temp 07/04/17 1535 98.5 F (36.9 C)     Temp Source 07/04/17 1535 Oral     SpO2 07/04/17 1535 98 %     Weight 07/04/17 1533 120 lb (54.4 kg)     Height 07/04/17 1533 5\' 7"  (1.702 m)     Head Circumference --      Peak Flow --      Pain Score 07/04/17 1533 0     Pain Loc --      Pain Edu? --      Excl. in GC? --     Constitutional: Alert and oriented. Well appearing and in no acute distress. Eyes: Conjunctivae are normal. Head: Atraumatic. Nose: No congestion/rhinnorhea. Mouth/Throat: Mucous membranes are moist. Neck: No stridor.   Cardiovascular: Normal rate, regular rhythm. Grossly normal heart sounds.  Good peripheral circulation. Respiratory: Normal respiratory effort.  No retractions. Lungs CTAB. Gastrointestinal: Soft and nontender. No distention. Musculoskeletal: No lower extremity tenderness nor edema. Neurologic:   NIH score equals 2, performed by me at bedside. The patient has no pronator drift. The patient has normal cranial nerve exam. Extraocular movements are normal. Visual fields are normal. Patient has 5 out of 5 strength in all extremities except for some slight weakness with use of the right hand and in particular pronation. There is no numbness or gross, acute sensory abnormality in the extremities bilaterally except for across the right hand and right upper arm and shoulder where she reports a slight decrease in sensation. No speech disturbance. No dysarthria. No aphasia. No ataxia. Normal finger nose finger bilat. Patient speaking in full and clear sentences.   Skin:  Skin is warm, dry and intact. No rash noted. Psychiatric: Mood and affect are normal. Speech and behavior are normal.  ____________________________________________   LABS (all labs ordered are listed, but only abnormal results are  displayed)  Labs Reviewed  CBC - Abnormal; Notable for the following components:      Result Value   Hemoglobin 11.8 (*)    MCV 79.9 (*)    MCH 25.1 (*)    MCHC 31.4 (*)    RDW 16.5 (*)    All other components within normal limits  COMPREHENSIVE METABOLIC PANEL - Abnormal; Notable for the following components:   Glucose, Bld 168 (*)    Total Protein 9.0 (*)    All other components within normal limits  PROTIME-INR  APTT  DIFFERENTIAL  TROPONIN I  CBG MONITORING, ED   ____________________________________________  EKG  Reviewed and her by me at 1545 Heart rate 90 QRS 120 QTC 480 Normal sinus rhythm, right bundle branch block.  Some baseline artifact, no evidence of ischemia denoted. ____________________________________________  RADIOLOGY  CT the  head negative for acute    ____________________________________________   PROCEDURES  Procedure(s) performed: None  Procedures  Critical Care performed: No  ____________________________________________   INITIAL IMPRESSION / ASSESSMENT AND PLAN / ED COURSE  Pertinent labs & imaging results that were available during my care of the patient were reviewed by me and considered in my medical decision making (see chart for details).  Patient presents for evaluation of right hand weakness and slight decrease in sensation of the right hand and right shoulder.  Denies trauma, did have a fall 2 weeks ago where she struck her head but does not seem to figure into today's presentation with a negative head CT.  No neck pain.  No radicular symptoms.  No associated cardiac or pulmonary symptoms.  Normal level of mentation.    The patient's NIH stroke scale is equal to 2.  Last known well about 11 PM last night.  Van negative with no signs or symptoms of a large vessel occlusion.  I will provide additional aspirin at this time, admit to hospital for further workup.  Differential does include peripheral neuropathy or radial  neuropathy, but the symptoms of numbing also noted over the right upper shoulder and ulnar distribution of the hand seem to suggest it is more central possibly an ischemic stroke which is my working impression at this time.  She does have a previous history of TIA and is already on aspirin therapy.  ----------------------------------------- 6:46 PM on 07/04/2017 -----------------------------------------  Case and care discussed with hospitalist, Dr. Tobi Bastos. ____________________________________________   FINAL CLINICAL IMPRESSION(S) / ED DIAGNOSES  Final diagnoses:  Ischemic stroke (HCC)  Weakness of right hand      NEW MEDICATIONS STARTED DURING THIS VISIT:  New Prescriptions   No medications on file     Note:  This document was prepared using Dragon voice recognition software and may include unintentional dictation errors.     Sharyn Creamer, MD 07/04/17 [phone]

## 2017-07-04 NOTE — H&P (Signed)
[facility] - [place] [facility]   PATIENT NAME: Susan [name]    MR#:  [id number]  DATE OF BIRTH:  Nov 21, 1926  DATE OF ADMISSION:  07/04/2017  PRIMARY CARE PHYSICIAN: [name], MD   REQUESTING/REFERRING PHYSICIAN:   CHIEF COMPLAINT:   Chief Complaint  Patient presents with  . Extremity Weakness    HISTORY OF PRESENT ILLNESS: Susan [name]  is a 84 y.o. female with a known history of diabetes mellitus type 2, hyperlipidemia, hypertension, CVA in the past presented to the emergency room with weakness in the right hand and right arm.  Patient woke up this morning and noticed some weakness in the right hand.  No tingling or numbness sensation.No headache dizziness and blurry vision. No complaints of slurred speech.  Patient was worked up with CT head which showed no acute abnormality.  Hospitalist service was consulted for further care.  PAST MEDICAL HISTORY:   Past Medical History:  Diagnosis Date  . Anemia   . Balance problem   . Diabetes mellitus without complication (HCC)   . Diverticulosis   . Heart murmur   . Hypercholesteremia   . Hypertension   . Stroke [facility])    TIA 2006  . TIA (transient ischemic attack) 2006    PAST SURGICAL HISTORY:  Past Surgical History:  Procedure Laterality Date  . CATARACT EXTRACTION W/PHACO Left 05/22/2015   Procedure: CATARACT EXTRACTION PHACO AND INTRAOCULAR LENS PLACEMENT (IOC);  Surgeon: Galen Manila, MD;  Location: [facility] ORS;  Service: Ophthalmology;  Laterality: Left;  [place]: 01:07.9 AP%: 23.0 CDE: 15.62 Lot# [id number]  . CATARACT EXTRACTION W/PHACO Right 06/12/2015   Procedure: CATARACT EXTRACTION PHACO AND INTRAOCULAR LENS PLACEMENT (IOC);  Surgeon: Galen Manila, MD;  Location: [facility] ORS;  Service: Ophthalmology;  Laterality: Right;  [place] 00:57 AP% 17.1 CDE 9.86 fluid pack lot # [id number]  . COLON SURGERY     resection  . ECTOPIC PREGNANCY SURGERY    . HERNIA REPAIR    . TONSILLECTOMY      SOCIAL  HISTORY:  Social History   Tobacco Use  . Smoking status: Former Games developer  . Smokeless tobacco: Former Engineer, water Use Topics  . Alcohol use: No    FAMILY HISTORY: No family history on file.  DRUG ALLERGIES:  Allergies  Allergen Reactions  . Atenolol Other (See Comments)    "affected my heart rate"  . Naprosyn [Naproxen] Other (See Comments)    GI Bleed    REVIEW OF SYSTEMS:   CONSTITUTIONAL: No fever, fatigue or weakness.  EYES: No blurred or double vision.  EARS, NOSE, AND THROAT: No tinnitus or ear pain.  RESPIRATORY: No cough, shortness of breath, wheezing or hemoptysis.  CARDIOVASCULAR: No chest pain, orthopnea, edema.  GASTROINTESTINAL: No nausea, vomiting, diarrhea or abdominal pain.  GENITOURINARY: No dysuria, hematuria.  ENDOCRINE: No polyuria, nocturia,  HEMATOLOGY: No anemia, easy bruising or bleeding SKIN: No rash or lesion. MUSCULOSKELETAL: No joint pain or arthritis.   weakness right hand and arm NEUROLOGIC: No tingling, numbness, weakness.  PSYCHIATRY: No anxiety or depression.   MEDICATIONS AT HOME:  Prior to Admission medications   Medication Sig Start Date End Date Taking? Authorizing Provider  amLODipine (NORVASC) 10 MG tablet Take 10 mg by mouth daily before supper.   Yes [provider]  ascorbic acid (VITAMIN C) 500 MG tablet Take 500 mg by mouth every evening.   Yes [provider]  aspirin 81 MG tablet Take 81 mg by mouth every morning.  Yes [provider]  atorvastatin (LIPITOR) 20 MG tablet Take 20 mg by mouth daily after supper.   Yes [provider]  Calcium Carbonate-Vitamin D 600-400 MG-UNIT tablet Take 1 tablet by mouth 2 (two) times daily.   Yes [provider]  glipiZIDE (GLUCOTROL XL) 5 MG 24 hr tablet Take 5 mg by mouth 2 (two) times daily.    Yes [provider]  metFORMIN (GLUCOPHAGE) 500 MG tablet Take 500 mg by mouth 2 (two) times daily with a meal.    Yes [provider]  Multiple Vitamin (MULTIVITAMIN WITH MINERALS) TABS tablet Take 1 tablet by mouth daily.   Yes [provider]  quinapril (ACCUPRIL) 40 MG tablet Take 40 mg by mouth 2 (two) times daily. Breakfast and dinner.    Yes [provider]      PHYSICAL EXAMINATION:   VITAL SIGNS: Blood pressure (!) 195/90, pulse 79, temperature 98.5 F (36.9 C), temperature source Oral, resp. rate 14, height 5\' 7"  (1.702 m), weight 54.4 kg (120 lb), SpO2 99 %.  GENERAL:  84 y.o.-year-old patient lying in the bed with no acute distress.  EYES: Pupils equal, round, reactive to light and accommodation. No scleral icterus. Extraocular muscles intact.  HEENT: Head atraumatic, normocephalic. Oropharynx and nasopharynx clear.  NECK:  Supple, no jugular venous distention. No thyroid enlargement, no tenderness.  LUNGS: Normal breath sounds bilaterally, no wheezing, rales,rhonchi or crepitation. No use of accessory muscles of respiration.  CARDIOVASCULAR: S1, S2 normal. No murmurs, rubs, or gallops.  ABDOMEN: Soft, nontender, nondistended. Bowel sounds present. No organomegaly or mass.  EXTREMITIES: No pedal edema, cyanosis, or clubbing.  NEUROLOGIC: Cranial nerves II through XII are intact. Muscle strength 5/5 in left upper and left lower extremities. Strength : 4/5 right upper extremity, 5/5 right lower extremity.  Sensation intact. Gait not checked.  PSYCHIATRIC: The patient is alert and oriented x 3.  SKIN: No obvious rash, lesion, or ulcer.   LABORATORY PANEL:   CBC Recent Labs  Lab 07/04/17 1537  WBC 6.5  HGB 11.8*  HCT 37.6  PLT 222  MCV 79.9*  MCH 25.1*  MCHC 31.4*  RDW 16.5*  LYMPHSABS 1.2  MONOABS 0.5  EOSABS 0.1  BASOSABS 0.1   ------------------------------------------------------------------------------------------------------------------  Chemistries  Recent Labs  Lab 07/04/17 1537  NA 139  K 4.2  CL 106  CO2 25  GLUCOSE 168*  BUN 20  CREATININE  0.68  CALCIUM 9.9  AST 32  ALT 20  ALKPHOS 61  BILITOT 0.5   ------------------------------------------------------------------------------------------------------------------ estimated creatinine clearance is 40.9 mL/min (by C-G formula based on SCr of 0.68 mg/dL). ------------------------------------------------------------------------------------------------------------------ No results for input(s): TSH, T4TOTAL, T3FREE, THYROIDAB in the last 72 hours.  Invalid input(s): FREET3   Coagulation profile Recent Labs  Lab 07/04/17 1537  INR 0.99   ------------------------------------------------------------------------------------------------------------------- No results for input(s): DDIMER in the last 72 hours. -------------------------------------------------------------------------------------------------------------------  Cardiac Enzymes Recent Labs  Lab 07/04/17 1537  TROPONINI <0.03   ------------------------------------------------------------------------------------------------------------------ Invalid input(s): POCBNP  ---------------------------------------------------------------------------------------------------------------  Urinalysis    Component Value Date/Time   COLORURINE Yellow 08/24/2012 1203   APPEARANCEUR Clear 08/24/2012 1203   LABSPEC 1.017 08/24/2012 1203   PHURINE 7.0 08/24/2012 1203   GLUCOSEU 50 mg/dL 95/62/1308 6578   HGBUR Negative 08/24/2012 1203   BILIRUBINUR Negative 08/24/2012 1203   KETONESUR Negative 08/24/2012 1203   PROTEINUR Negative 08/24/2012 1203   NITRITE Negative 08/24/2012 1203   LEUKOCYTESUR Trace 08/24/2012 1203     RADIOLOGY: Ct Head Wo  Contrast  Result Date: 07/04/2017 CLINICAL DATA:  Right hand weakness. EXAM: CT HEAD WITHOUT CONTRAST TECHNIQUE: Contiguous axial images were obtained from the base of the skull through the vertex without intravenous contrast. COMPARISON:  February 08, 2017 FINDINGS: Brain: No  subdural, epidural, or subarachnoid hemorrhage. Cerebellum, brainstem, and basal cisterns are normal. Ventricles and sulci are prominent but stable. No mass effect or midline shift. Mild white matter changes are identified. No acute cortical ischemia or infarct is noted. Vascular: Calcified atherosclerosis seen in the intracranial carotids. Skull: Normal. Negative for fracture or focal lesion. Sinuses/Orbits: No acute finding. Other: None. IMPRESSION: No acute intracranial abnormality is seen to explain the patient's symptoms. Electronically Signed   By: Gerome Samavid  Williams III M.D   On: 07/04/2017 16:41    EKG: Orders placed or performed during the hospital encounter of 07/04/17  . ED EKG  . ED EKG    IMPRESSION AND PLAN:  84 year old elderly female patient with history of diabetes mellitus type 2, hyperlipidemia, hypertension, stroke in the past presented to the emergency room with weakness in the right hand and right arm.  1.  Acute ischemic CVA Start patient on aspirin Check MRI and MRA brain Carotid ultrasound Echocardiogram Neurology consultation Physical therapy consult  2.  Type 2 diabetes mellitus  diabetic diet Oral diabetic medication  3.  Hypertension Continue ACE inhibitor and amlodipine for hypertension  4.  DVT prophylaxis sequential compression device to lower extremities   All the records are reviewed and case discussed with ED provider. Management plans discussed with the patient, family and they are in agreement.  CODE STATUS:FULL CODE   TOTAL TIME TAKING CARE OF THIS PATIENT: 51 minutes.    Ihor AustinPavan Pyreddy M.D on 07/04/2017 at 7:22 PM  Between 7am to 6pm - Pager - [phone]  After 6pm go to [email or web address] - password [email or web address] [facility]  [facility]  Office  [phone]  CC: Primary care physician; Hillery AldoPatel, Sarah, MD

## 2017-07-04 NOTE — ED Triage Notes (Signed)
Pt to ED via POV for right hand weakness. Pt denies any weakness in the right leg. Pt states that she is unable to hold anything in her right hand. Last known well time yesterday at 1100 pm. Pt states that she noticed the symptoms this morning. Pt right grip noted to be weaker than the left in triage. No facial droop noted. No slurred speech. Pt states that she is dizzy and off balance but no more than her normal baseline. Pt did have a fall 2 weeks ago where she hit her head. Pt has knot on the left forehead. Pt was not seen when she fell.

## 2017-07-04 NOTE — Progress Notes (Addendum)
Advanced care plan.  Purpose of the Encounter: CODE STATUS  Parties in Attendance:Patient and daughter  Patient's Decision Capacity:Good  Subjective/Patient's story: Presented with right hand and arm weakness   Objective/Medical story Has weakness in right hand and arm Check mri/mra brain, carotid ultrasound and echocardiogram  Goals of care determination: Resuscitation status and Intubation and ventilator mgmt was discussed if need arises. Patient and family want everything done.  CODE STATUS: FULL CODE   Time spent discussing advanced care planning: 16 minutes

## 2017-07-04 NOTE — ED Notes (Signed)
Attempted to call report to floor. Primary RN unavailable at this time.  

## 2017-07-05 ENCOUNTER — Observation Stay: Payer: Medicare Other

## 2017-07-05 ENCOUNTER — Observation Stay
Admit: 2017-07-05 | Discharge: 2017-07-05 | Disposition: A | Discharge status: Home / Self Care | Payer: Medicare Other | Attending: Internal Medicine | Admitting: Internal Medicine

## 2017-07-05 DIAGNOSIS — R29898 Other symptoms and signs involving the musculoskeletal system: Secondary | ICD-10-CM

## 2017-07-05 LAB — URINALYSIS, COMPLETE (UACMP) WITH MICROSCOPIC
BILIRUBIN URINE: NEGATIVE
Hgb urine dipstick: NEGATIVE
Ketones, ur: NEGATIVE mg/dL
Nitrite: POSITIVE — AB
PH: 6 (ref 5.0–8.0)
Protein, ur: 30 mg/dL — AB
SPECIFIC GRAVITY, URINE: 1.01 (ref 1.005–1.030)

## 2017-07-05 LAB — LIPID PANEL
CHOL/HDL RATIO: 2.4 ratio
CHOLESTEROL: 133 mg/dL (ref 0–200)
HDL: 55 mg/dL (ref >40)
LDL Cholesterol: 69 mg/dL (ref 0–99)
TRIGLYCERIDES: 43 mg/dL (ref <150)
VLDL: 9 mg/dL (ref 0–40)

## 2017-07-05 LAB — ECHOCARDIOGRAM COMPLETE
Height: 66 in
Weight: 1814 oz

## 2017-07-05 LAB — GLUCOSE, CAPILLARY
GLUCOSE-CAPILLARY: 108 mg/dL — AB (ref 65–99)
GLUCOSE-CAPILLARY: 241 mg/dL — AB (ref 65–99)
GLUCOSE-CAPILLARY: 252 mg/dL — AB (ref 65–99)
Glucose-Capillary: 164 mg/dL — ABNORMAL HIGH (ref 65–99)

## 2017-07-05 MED ORDER — CEPHALEXIN 500 MG PO CAPS
500.0000 mg | ORAL_CAPSULE | Freq: Two times a day (BID) | ORAL | Status: DC
  Administered 2017-07-05 – 2017-07-06 (×2): 500 mg via ORAL
  Filled 2017-07-05 (×2): qty 1

## 2017-07-05 NOTE — Progress Notes (Signed)
[facility] - [place] [facility]   PATIENT NAME: Susan [name]    MR#:  [id number]  DATE OF BIRTH:  1927-03-28  SUBJECTIVE:  CHIEF COMPLAINT:   Chief Complaint  Patient presents with  . Extremity Weakness   Still has Right hand weakness   REVIEW OF SYSTEMS:    Review of Systems  Constitutional: Negative for chills and fever.  HENT: Negative for sore throat.   Eyes: Negative for blurred vision, double vision and pain.  Respiratory: Negative for cough, hemoptysis, shortness of breath and wheezing.   Cardiovascular: Negative for chest pain, palpitations, orthopnea and leg swelling.  Gastrointestinal: Negative for abdominal pain, constipation, diarrhea, heartburn, nausea and vomiting.  Genitourinary: Negative for dysuria and hematuria.  Musculoskeletal: Negative for back pain and joint pain.  Skin: Negative for rash.  Neurological: Positive for focal weakness. Negative for sensory change, speech change and headaches.  Endo/Heme/Allergies: Does not bruise/bleed easily.  Psychiatric/Behavioral: Negative for depression. The patient is not nervous/anxious.     DRUG ALLERGIES:   Allergies  Allergen Reactions  . Atenolol Other (See Comments)    "affected my heart rate"  . Naprosyn [Naproxen] Other (See Comments)    GI Bleed    VITALS:  Blood pressure (!) 115/46, pulse 68, temperature 98.5 F (36.9 C), temperature source Oral, resp. rate 18, height 5\' 6"  (1.676 m), weight 51.4 kg (113 lb 6 oz), SpO2 97 %.  PHYSICAL EXAMINATION:   Physical Exam  GENERAL:  84 y.o.-year-old patient lying in the bed with no acute distress.  EYES: Pupils equal, round, reactive to light and accommodation. No scleral icterus. Extraocular muscles intact.  HEENT: Head atraumatic, normocephalic. Oropharynx and nasopharynx clear.  NECK:  Supple, no jugular venous distention. No thyroid enlargement, no tenderness.  LUNGS: Normal breath sounds bilaterally, no wheezing, rales, rhonchi. No  use of accessory muscles of respiration.  CARDIOVASCULAR: S1, S2 normal. No murmurs, rubs, or gallops.  ABDOMEN: Soft, nontender, nondistended. Bowel sounds present. No organomegaly or mass.  EXTREMITIES: No cyanosis, clubbing or edema b/l.    NEUROLOGIC: Cranial nerves II through XII are intact. No focal Motor or sensory deficits b/l.   Right hand grip weak PSYCHIATRIC: The patient is alert and oriented x 3.  SKIN: No obvious rash, lesion, or ulcer.   LABORATORY PANEL:   CBC Recent Labs  Lab 07/04/17 1537  WBC 6.5  HGB 11.8*  HCT 37.6  PLT 222   ------------------------------------------------------------------------------------------------------------------ Chemistries  Recent Labs  Lab 07/04/17 1537  NA 139  K 4.2  CL 106  CO2 25  GLUCOSE 168*  BUN 20  CREATININE 0.68  CALCIUM 9.9  AST 32  ALT 20  ALKPHOS 61  BILITOT 0.5   ------------------------------------------------------------------------------------------------------------------  Cardiac Enzymes Recent Labs  Lab 07/04/17 1537  TROPONINI <0.03   ------------------------------------------------------------------------------------------------------------------  RADIOLOGY:  Ct Head Wo Contrast  Result Date: 07/04/2017 CLINICAL DATA:  Right hand weakness. EXAM: CT HEAD WITHOUT CONTRAST TECHNIQUE: Contiguous axial images were obtained from the base of the skull through the vertex without intravenous contrast. COMPARISON:  February 08, 2017 FINDINGS: Brain: No subdural, epidural, or subarachnoid hemorrhage. Cerebellum, brainstem, and basal cisterns are normal. Ventricles and sulci are prominent but stable. No mass effect or midline shift. Mild white matter changes are identified. No acute cortical ischemia or infarct is noted. Vascular: Calcified atherosclerosis seen in the intracranial carotids. Skull: Normal. Negative for fracture or focal lesion. Sinuses/Orbits: No acute finding. Other: None. IMPRESSION: No  acute intracranial abnormality is seen  to explain the patient's symptoms. Electronically Signed   By: Gerome Sam III M.D   On: 07/04/2017 16:41   Mr Brain Wo Contrast  Result Date: 07/05/2017 CLINICAL DATA:  TIA. Initial exam. Right upper extremity numbness and weakness beginning yesterday morning. EXAM: MRI HEAD WITHOUT CONTRAST MRA HEAD WITHOUT CONTRAST TECHNIQUE: Multiplanar, multiecho pulse sequences of the brain and surrounding structures were obtained without intravenous contrast. Angiographic images of the head were obtained using MRA technique without contrast. COMPARISON:  CT head without contrast 07/04/2017. MRI brain 11/11/2007 FINDINGS: MRI HEAD FINDINGS Brain: The diffusion-weighted images demonstrate no acute or subacute infarction. Mild atrophy and white matter changes are likely within normal limits for age. Ventricles proportionate to the degree of atrophy. Internal auditory canals are within normal limits bilaterally. The brainstem and cerebellum are normal. Vascular: Flow is present in the major intracranial arteries. Skull and upper cervical spine: The skull base is within normal limits. The craniocervical junction is normal. Mild degenerative changes are present at C4-5. Marrow signal is within normal limits. Sinuses/Orbits: The paranasal sinuses and mastoid air cells are clear. Bilateral lens replacements are present. Globes and orbits are within normal limits. MRA HEAD FINDINGS Atherosclerotic irregularity is present within the cavernous internal carotid arteries bilaterally, left greater than right. There is no significant stenosis of greater than 50% relative to the more distal vessel. ICA termini are within normal limits bilaterally. Mild narrowing is present in the proximal right A1 segment. The left A1 segment and bilateral M1 segments are normal. MCA bifurcations are within normal limits. ACA and MCA branch vessels are normal proximally. There is some attenuation distal ACA and  MCA branch vessels. The vertebral arteries are codominant. The PICA origins are visualized and normal. The basilar artery is normal. Both posterior cerebral arteries originate from basilar tip. A right posterior communicating artery contributes. Distal PCA branch vessel attenuation is noted bilaterally. IMPRESSION: 1. Normal MRI appearance of the brain for age. No acute intracranial abnormality. 2. Mild degenerative changes at C4-5. 3. Atherosclerotic irregularity in the cavernous internal carotid arteries bilaterally without a focal stenosis of greater than 50%. 4. Mild diffuse distal small vessel disease without other proximal stenosis, aneurysm, or branch vessel occlusion within the circle-of-Willis. Electronically Signed   By: Marin Roberts M.D.   On: 07/05/2017 11:28   US Carotid Bilateral (at [facility] And [facility] Only)  Result Date: 07/05/2017 CLINICAL DATA:  84 year old with right-sided weakness. EXAM: BILATERAL CAROTID DUPLEX ULTRASOUND TECHNIQUE: Bittinger scale imaging, color Doppler and duplex ultrasound were performed of bilateral carotid and vertebral arteries in the neck. COMPARISON:  Brain MRI 07/05/2017 FINDINGS: Criteria: Quantification of carotid stenosis is based on velocity parameters that correlate the residual internal carotid diameter with NASCET-based stenosis levels, using the diameter of the distal internal carotid lumen as the denominator for stenosis measurement. The following velocity measurements were obtained: RIGHT ICA:  103 cm/sec CCA:  78 cm/sec SYSTOLIC ICA/CCA RATIO:  1.3 DIASTOLIC ICA/CCA RATIO:  1.2 ECA:  115 cm/sec LEFT ICA:  96 cm/sec CCA:  111 cm/sec SYSTOLIC ICA/CCA RATIO:  0.9 DIASTOLIC ICA/CCA RATIO:  1.0 ECA:  101 cm/sec RIGHT CAROTID ARTERY: Small focus of echogenic plaque in the distal common carotid artery and bulb. No significant stenosis at the bulb. External carotid artery is patent with normal waveform. Normal waveforms and velocities in the internal carotid artery.  RIGHT VERTEBRAL ARTERY: Antegrade flow and normal waveform in the right vertebral artery. LEFT CAROTID ARTERY: Small amount of plaque at the left carotid  bulb without significant stenosis. External carotid artery is patent with normal waveform. Internal carotid artery is tortuous. Normal waveforms and velocities in the internal carotid artery. LEFT VERTEBRAL ARTERY: Antegrade flow and normal waveform in the left vertebral artery. IMPRESSION: Mild atherosclerotic disease in the carotid arteries, predominantly at the carotid bulbs. Estimated degree of stenosis in the internal carotid arteries is less than 50% bilaterally. Patent vertebral arteries with antegrade flow. Electronically Signed   By: Richarda OverlieAdam  Henn M.D.   On: 07/05/2017 15:20   Mr Maxine GlennMra Head/brain NWWo Cm  Result Date: 07/05/2017 CLINICAL DATA:  TIA. Initial exam. Right upper extremity numbness and weakness beginning yesterday morning. EXAM: MRI HEAD WITHOUT CONTRAST MRA HEAD WITHOUT CONTRAST TECHNIQUE: Multiplanar, multiecho pulse sequences of the brain and surrounding structures were obtained without intravenous contrast. Angiographic images of the head were obtained using MRA technique without contrast. COMPARISON:  CT head without contrast 07/04/2017. MRI brain 11/11/2007 FINDINGS: MRI HEAD FINDINGS Brain: The diffusion-weighted images demonstrate no acute or subacute infarction. Mild atrophy and white matter changes are likely within normal limits for age. Ventricles proportionate to the degree of atrophy. Internal auditory canals are within normal limits bilaterally. The brainstem and cerebellum are normal. Vascular: Flow is present in the major intracranial arteries. Skull and upper cervical spine: The skull base is within normal limits. The craniocervical junction is normal. Mild degenerative changes are present at C4-5. Marrow signal is within normal limits. Sinuses/Orbits: The paranasal sinuses and mastoid air cells are clear. Bilateral lens  replacements are present. Globes and orbits are within normal limits. MRA HEAD FINDINGS Atherosclerotic irregularity is present within the cavernous internal carotid arteries bilaterally, left greater than right. There is no significant stenosis of greater than 50% relative to the more distal vessel. ICA termini are within normal limits bilaterally. Mild narrowing is present in the proximal right A1 segment. The left A1 segment and bilateral M1 segments are normal. MCA bifurcations are within normal limits. ACA and MCA branch vessels are normal proximally. There is some attenuation distal ACA and MCA branch vessels. The vertebral arteries are codominant. The PICA origins are visualized and normal. The basilar artery is normal. Both posterior cerebral arteries originate from basilar tip. A right posterior communicating artery contributes. Distal PCA branch vessel attenuation is noted bilaterally. IMPRESSION: 1. Normal MRI appearance of the brain for age. No acute intracranial abnormality. 2. Mild degenerative changes at C4-5. 3. Atherosclerotic irregularity in the cavernous internal carotid arteries bilaterally without a focal stenosis of greater than 50%. 4. Mild diffuse distal small vessel disease without other proximal stenosis, aneurysm, or branch vessel occlusion within the circle-of-Willis. Electronically Signed   By: Marin Robertshristopher  Mattern M.D.   On: 07/05/2017 11:28     ASSESSMENT AND PLAN:   * Right hand weakness MRI brain negative. Order MRI c-spine. Discussed with Dr. Mechele CollinZeylkman  * UTI Started on keflex  * DM2 SSI  * HTN ON home meds  All the records are reviewed and case discussed with Care Management/Social Workerr. Management plans discussed with the patient, family and they are in agreement.  CODE STATUS: FULL CODE  DVT Prophylaxis: SCDs  TOTAL TIME TAKING CARE OF THIS PATIENT: 30 minutes.   POSSIBLE D/C IN 1-2 DAYS, DEPENDING ON CLINICAL CONDITION.  Molinda BailiffSrikar R Kabella Cassidy M.D on  07/05/2017 at 10:56 PM  Between 7am to 6pm - Pager - [phone]  After 6pm go to [email or web address] - password [email or web address] [facility]  [facility]  Office  [phone]  CC: Primary care physician;  [name], MD  Note: This dictation was prepared with Dragon dictation along with smaller phrase technology. Any transcriptional errors that result from this process are unintentional.

## 2017-07-05 NOTE — Plan of Care (Signed)
  Problem: Education: Goal: Knowledge of disease or condition will improve 07/05/2017 1733 by Burnett KanarisPerez, Ival Basquez N, RN Outcome: Progressing 07/05/2017 1730 by Burnett KanarisPerez, Bernice Mcauliffe N, RN Outcome: Progressing Goal: Knowledge of secondary prevention will improve 07/05/2017 1733 by Burnett KanarisPerez, Adalbert Alberto N, RN Outcome: Progressing 07/05/2017 1730 by Burnett KanarisPerez, Geralene Afshar N, RN Outcome: Progressing Goal: Knowledge of patient specific risk factors addressed and post discharge goals established will improve 07/05/2017 1733 by Burnett KanarisPerez, Dezaria Methot N, RN Outcome: Progressing 07/05/2017 1730 by Burnett KanarisPerez, Salima Rumer N, RN Outcome: Progressing   Problem: Coping: Goal: Will verbalize positive feelings about self 07/05/2017 1733 by Burnett KanarisPerez, Kamerin Grumbine N, RN Outcome: Progressing 07/05/2017 1730 by Burnett KanarisPerez, Makinzie Considine N, RN Outcome: Progressing   Problem: Health Behavior/Discharge Planning: Goal: Ability to manage health-related needs will improve 07/05/2017 1733 by Burnett KanarisPerez, Dia Jefferys N, RN Outcome: Progressing 07/05/2017 1730 by Burnett KanarisPerez, Luccia Reinheimer N, RN Outcome: Progressing   Problem: Self-Care: Goal: Ability to participate in self-care as condition permits will improve 07/05/2017 1733 by Burnett KanarisPerez, Bobbye Reinitz N, RN Outcome: Progressing 07/05/2017 1730 by Burnett KanarisPerez, Juliany Daughety N, RN Outcome: Progressing Goal: Ability to communicate needs accurately will improve 07/05/2017 1733 by Burnett KanarisPerez, Javione Gunawan N, RN Outcome: Progressing 07/05/2017 1730 by Burnett KanarisPerez, Ayeshia Coppin N, RN Outcome: Progressing   Problem: Nutrition: Goal: Risk of aspiration will decrease 07/05/2017 1733 by Burnett KanarisPerez, Kalik Hoare N, RN Outcome: Progressing 07/05/2017 1730 by Burnett KanarisPerez, Ariya Bohannon N, RN Outcome: Progressing   Problem: Ischemic Stroke/TIA Tissue Perfusion: Goal: Complications of ischemic stroke/TIA will be minimized 07/05/2017 1733 by Burnett KanarisPerez, Dazaria Macneill N, RN Outcome: Progressing 07/05/2017 1730 by Burnett KanarisPerez, Carvell Hoeffner N, RN Outcome: Progressing   Problem: Education: Goal: Knowledge of General Education information will  improve 07/05/2017 1733 by Burnett KanarisPerez, Diamante Rubin N, RN Outcome: Progressing 07/05/2017 1730 by Burnett KanarisPerez, Adriana Quinby N, RN Outcome: Progressing   Problem: Clinical Measurements: Goal: Ability to maintain clinical measurements within normal limits will improve 07/05/2017 1733 by Burnett KanarisPerez, Noris Kulinski N, RN Outcome: Progressing 07/05/2017 1730 by Burnett KanarisPerez, Abel Hageman N, RN Outcome: Progressing Goal: Will remain free from infection 07/05/2017 1733 by Burnett KanarisPerez, Zavien Clubb N, RN Outcome: Progressing 07/05/2017 1730 by Burnett KanarisPerez, Iria Jamerson N, RN Outcome: Progressing Goal: Diagnostic test results will improve 07/05/2017 1733 by Burnett KanarisPerez, Nereyda Bowler N, RN Outcome: Progressing 07/05/2017 1730 by Burnett KanarisPerez, Tevis Conger N, RN Outcome: Progressing   Problem: Pain Managment: Goal: General experience of comfort will improve 07/05/2017 1733 by Burnett KanarisPerez, Breylan Lefevers N, RN Outcome: Progressing 07/05/2017 1730 by Burnett KanarisPerez, Denette Hass N, RN Outcome: Progressing   Problem: Safety: Goal: Ability to remain free from injury will improve 07/05/2017 1733 by Burnett KanarisPerez, Kinzy Weyers N, RN Outcome: Progressing 07/05/2017 1730 by Burnett KanarisPerez, Alegra Rost N, RN Outcome: Progressing

## 2017-07-05 NOTE — Consult Note (Addendum)
Reason for Consult:R hand weakness  Referring Physician: Dr. Elpidio Anis  CC: R hand weakness   HPI: Susan [name] is an 84 y.o. female with a known history of diabetes mellitus type 2, hyperlipidemia, hypertension, CVA in the past presented to the emergency room with weakness in the right hand and right arm.  Patient woke up yesterday and noticed some weakness in the right hand.  No tingling or numbness sensation.No headache dizziness and blurry vision. No abnormalities on MRI/MRA   Past Medical History:  Diagnosis Date  . Anemia   . Balance problem   . Diabetes mellitus without complication (HCC)   . Diverticulosis   . Heart murmur   . Hypercholesteremia   . Hypertension   . Stroke [facility])    TIA 2006  . TIA (transient ischemic attack) 2006    Past Surgical History:  Procedure Laterality Date  . CATARACT EXTRACTION W/PHACO Left 05/22/2015   Procedure: CATARACT EXTRACTION PHACO AND INTRAOCULAR LENS PLACEMENT (IOC);  Surgeon: Galen Manila, MD;  Location: [facility] ORS;  Service: Ophthalmology;  Laterality: Left;  [place]: 01:07.9 AP%: 23.0 CDE: 15.62 Lot# [id number]  . CATARACT EXTRACTION W/PHACO Right 06/12/2015   Procedure: CATARACT EXTRACTION PHACO AND INTRAOCULAR LENS PLACEMENT (IOC);  Surgeon: Galen Manila, MD;  Location: [facility] ORS;  Service: Ophthalmology;  Laterality: Right;  [place] 00:57 AP% 17.1 CDE 9.86 fluid pack lot # [id number]  . COLON SURGERY     resection  . ECTOPIC PREGNANCY SURGERY    . HERNIA REPAIR    . TONSILLECTOMY      History reviewed. No pertinent family history.  Social History:  reports that she has quit smoking. She has quit using smokeless tobacco. She reports that she does not drink alcohol. Her drug history is not on file.  Allergies  Allergen Reactions  . Atenolol Other (See Comments)    "affected my heart rate"  . Naprosyn [Naproxen] Other (See Comments)    GI Bleed    Medications: I have reviewed the patient's current medications.  ROS: History  obtained from the patient  General ROS: negative for - chills, fatigue, fever, night sweats, weight gain or weight loss Psychological ROS: negative for - behavioral disorder, hallucinations, memory difficulties, mood swings or suicidal ideation Ophthalmic ROS: negative for - blurry vision, double vision, eye pain or loss of vision ENT ROS: negative for - epistaxis, nasal discharge, oral lesions, sore throat, tinnitus or vertigo Allergy and Immunology ROS: negative for - hives or itchy/watery eyes Hematological and Lymphatic ROS: negative for - bleeding problems, bruising or swollen lymph nodes Endocrine ROS: negative for - galactorrhea, hair pattern changes, polydipsia/polyuria or temperature intolerance Respiratory ROS: negative for - cough, hemoptysis, shortness of breath or wheezing Cardiovascular ROS: negative for - chest pain, dyspnea on exertion, edema or irregular heartbeat Gastrointestinal ROS: negative for - abdominal pain, diarrhea, hematemesis, nausea/vomiting or stool incontinence Genito-Urinary ROS: negative for - dysuria, hematuria, incontinence or urinary frequency/urgency Musculoskeletal ROS: negative for - joint swelling or muscular weakness Neurological ROS: as noted in HPI Dermatological ROS: negative for rash and skin lesion changes  Physical Examination: Blood pressure (!) 149/61, pulse 68, temperature 98.3 F (36.8 C), temperature source Oral, resp. rate 18, height 5\' 6"  (1.676 m), weight 113 lb 6 oz (51.4 kg), SpO2 98 %.   Neurological Examination   Mental Status: Alert, oriented, thought content appropriate.  Speech fluent without evidence of aphasia.  Able to follow 3 step commands without difficulty. Cranial Nerves: II: Discs flat bilaterally; Visual fields  grossly normal, pupils equal, round, reactive to light and accommodation III,IV, VI: ptosis not present, extra-ocular motions intact bilaterally V,VII: smile symmetric, facial light touch sensation normal  bilaterally VIII: hearing normal bilaterally IX,X: gag reflex present XI: bilateral shoulder shrug XII: midline tongue extension Motor: Right : Upper extremity   5/5 Distally 4/5, weak hand grip   Left:     Upper extremity   5/5  Lower extremity   5/5     Lower extremity   5/5 Tone and bulk:normal tone throughout; no atrophy noted Sensory: decreased sensation on R wrist.  Deep Tendon Reflexes: 1+ and symmetric throughout Plantars: Right: downgoing   Left: downgoing Cerebellar: normal finger-to-nose, normal rapid alternating movements and normal heel-to-shin test Gait: not tested      Laboratory Studies:   Basic Metabolic Panel: Recent Labs  Lab 07/04/17 1537  NA 139  K 4.2  CL 106  CO2 25  GLUCOSE 168*  BUN 20  CREATININE 0.68  CALCIUM 9.9    Liver Function Tests: Recent Labs  Lab 07/04/17 1537  AST 32  ALT 20  ALKPHOS 61  BILITOT 0.5  PROT 9.0*  ALBUMIN 4.4   No results for input(s): LIPASE, AMYLASE in the last 168 hours. No results for input(s): AMMONIA in the last 168 hours.  CBC: Recent Labs  Lab 07/04/17 1537  WBC 6.5  NEUTROABS 4.8  HGB 11.8*  HCT 37.6  MCV 79.9*  PLT 222    Cardiac Enzymes: Recent Labs  Lab 07/04/17 1537  TROPONINI <0.03    BNP: Invalid input(s): POCBNP  CBG: Recent Labs  Lab 07/04/17 2210 07/05/17 0747 07/05/17 1205  GLUCAP 120* 108* 241*    Microbiology: No results found for this or any previous visit.  Coagulation Studies: Recent Labs    07/04/17 1537  LABPROT 13.0  INR 0.99    Urinalysis: No results for input(s): COLORURINE, LABSPEC, PHURINE, GLUCOSEU, HGBUR, BILIRUBINUR, KETONESUR, PROTEINUR, UROBILINOGEN, NITRITE, LEUKOCYTESUR in the last 168 hours.  Invalid input(s): APPERANCEUR  Lipid Panel:     Component Value Date/Time   CHOL 133 07/05/2017 0357   TRIG 43 07/05/2017 0357   HDL 55 07/05/2017 0357   CHOLHDL 2.4 07/05/2017 0357   VLDL 9 07/05/2017 0357   LDLCALC 69 07/05/2017 0357     HgbA1C: No results found for: HGBA1C  Urine Drug Screen:  No results found for: LABOPIA, COCAINSCRNUR, LABBENZ, AMPHETMU, THCU, LABBARB  Alcohol Level: No results for input(s): ETH in the last 168 hours.  Imaging: Ct Head Wo Contrast  Result Date: 07/04/2017 CLINICAL DATA:  Right hand weakness. EXAM: CT HEAD WITHOUT CONTRAST TECHNIQUE: Contiguous axial images were obtained from the base of the skull through the vertex without intravenous contrast. COMPARISON:  February 08, 2017 FINDINGS: Brain: No subdural, epidural, or subarachnoid hemorrhage. Cerebellum, brainstem, and basal cisterns are normal. Ventricles and sulci are prominent but stable. No mass effect or midline shift. Mild white matter changes are identified. No acute cortical ischemia or infarct is noted. Vascular: Calcified atherosclerosis seen in the intracranial carotids. Skull: Normal. Negative for fracture or focal lesion. Sinuses/Orbits: No acute finding. Other: None. IMPRESSION: No acute intracranial abnormality is seen to explain the patient's symptoms. Electronically Signed   By: Gerome Sam III M.D   On: 07/04/2017 16:41   Mr Brain Wo Contrast  Result Date: 07/05/2017 CLINICAL DATA:  TIA. Initial exam. Right upper extremity numbness and weakness beginning yesterday morning. EXAM: MRI HEAD WITHOUT CONTRAST MRA HEAD WITHOUT CONTRAST TECHNIQUE: Multiplanar, multiecho  pulse sequences of the brain and surrounding structures were obtained without intravenous contrast. Angiographic images of the head were obtained using MRA technique without contrast. COMPARISON:  CT head without contrast 07/04/2017. MRI brain 11/11/2007 FINDINGS: MRI HEAD FINDINGS Brain: The diffusion-weighted images demonstrate no acute or subacute infarction. Mild atrophy and white matter changes are likely within normal limits for age. Ventricles proportionate to the degree of atrophy. Internal auditory canals are within normal limits bilaterally. The brainstem  and cerebellum are normal. Vascular: Flow is present in the major intracranial arteries. Skull and upper cervical spine: The skull base is within normal limits. The craniocervical junction is normal. Mild degenerative changes are present at C4-5. Marrow signal is within normal limits. Sinuses/Orbits: The paranasal sinuses and mastoid air cells are clear. Bilateral lens replacements are present. Globes and orbits are within normal limits. MRA HEAD FINDINGS Atherosclerotic irregularity is present within the cavernous internal carotid arteries bilaterally, left greater than right. There is no significant stenosis of greater than 50% relative to the more distal vessel. ICA termini are within normal limits bilaterally. Mild narrowing is present in the proximal right A1 segment. The left A1 segment and bilateral M1 segments are normal. MCA bifurcations are within normal limits. ACA and MCA branch vessels are normal proximally. There is some attenuation distal ACA and MCA branch vessels. The vertebral arteries are codominant. The PICA origins are visualized and normal. The basilar artery is normal. Both posterior cerebral arteries originate from basilar tip. A right posterior communicating artery contributes. Distal PCA branch vessel attenuation is noted bilaterally. IMPRESSION: 1. Normal MRI appearance of the brain for age. No acute intracranial abnormality. 2. Mild degenerative changes at C4-5. 3. Atherosclerotic irregularity in the cavernous internal carotid arteries bilaterally without a focal stenosis of greater than 50%. 4. Mild diffuse distal small vessel disease without other proximal stenosis, aneurysm, or branch vessel occlusion within the circle[place]of[place]. Electronically Signed   By: Marin Roberts M.D.   On: 07/05/2017 11:28   Mr Maxine Glenn Head/brain UE Cm  Result Date: 07/05/2017 CLINICAL DATA:  TIA. Initial exam. Right upper extremity numbness and weakness beginning yesterday morning. EXAM: MRI HEAD  WITHOUT CONTRAST MRA HEAD WITHOUT CONTRAST TECHNIQUE: Multiplanar, multiecho pulse sequences of the brain and surrounding structures were obtained without intravenous contrast. Angiographic images of the head were obtained using MRA technique without contrast. COMPARISON:  CT head without contrast 07/04/2017. MRI brain 11/11/2007 FINDINGS: MRI HEAD FINDINGS Brain: The diffusion-weighted images demonstrate no acute or subacute infarction. Mild atrophy and white matter changes are likely within normal limits for age. Ventricles proportionate to the degree of atrophy. Internal auditory canals are within normal limits bilaterally. The brainstem and cerebellum are normal. Vascular: Flow is present in the major intracranial arteries. Skull and upper cervical spine: The skull base is within normal limits. The craniocervical junction is normal. Mild degenerative changes are present at C4-5. Marrow signal is within normal limits. Sinuses/Orbits: The paranasal sinuses and mastoid air cells are clear. Bilateral lens replacements are present. Globes and orbits are within normal limits. MRA HEAD FINDINGS Atherosclerotic irregularity is present within the cavernous internal carotid arteries bilaterally, left greater than right. There is no significant stenosis of greater than 50% relative to the more distal vessel. ICA termini are within normal limits bilaterally. Mild narrowing is present in the proximal right A1 segment. The left A1 segment and bilateral M1 segments are normal. MCA bifurcations are within normal limits. ACA and MCA branch vessels are normal proximally. There is some attenuation  distal ACA and MCA branch vessels. The vertebral arteries are codominant. The PICA origins are visualized and normal. The basilar artery is normal. Both posterior cerebral arteries originate from basilar tip. A right posterior communicating artery contributes. Distal PCA branch vessel attenuation is noted bilaterally. IMPRESSION: 1.  Normal MRI appearance of the brain for age. No acute intracranial abnormality. 2. Mild degenerative changes at C4-5. 3. Atherosclerotic irregularity in the cavernous internal carotid arteries bilaterally without a focal stenosis of greater than 50%. 4. Mild diffuse distal small vessel disease without other proximal stenosis, aneurysm, or branch vessel occlusion within the circle-of-Willis. Electronically Signed   By: Marin Robertshristopher  Mattern M.D.   On: 07/05/2017 11:28     Assessment/Plan:  84 y.o. female with a known history of diabetes mellitus type 2, hyperlipidemia, hypertension, CVA in the past presented to the emergency room with weakness in the right hand and right arm.  Patient woke up yesterday and noticed some weakness in the right hand.  No tingling or numbness sensation.No headache dizziness and blurry vision. No abnormalities on MRI/MRA  Unclear etiology of RUE distal weakness.  Will order MRI C spine specifically since she has fallen about 10 times in past year.  Will follow  07/05/2017, 2:38 PM

## 2017-07-05 NOTE — Progress Notes (Signed)
Pharmacist - Prescriber Communication  Keflex modified from 500 mg po Q8H to 500 mg po Q12H due to creatinine clearance 30 to 59 mL/min.   Kimberlye Dilger A. Micanopyookson, VermontPharm.D., BCPS Clinical Pharmacist 07/05/17 16:27

## 2017-07-05 NOTE — Plan of Care (Signed)
  Problem: Education: Goal: Knowledge of disease or condition will improve Outcome: Progressing Goal: Knowledge of secondary prevention will improve Outcome: Progressing Goal: Knowledge of patient specific risk factors addressed and post discharge goals established will improve Outcome: Progressing   Problem: Coping: Goal: Will verbalize positive feelings about self Outcome: Progressing   Problem: Health Behavior/Discharge Planning: Goal: Ability to manage health-related needs will improve Outcome: Progressing   Problem: Self-Care: Goal: Ability to participate in self-care as condition permits will improve Outcome: Progressing Goal: Ability to communicate needs accurately will improve Outcome: Progressing   Problem: Nutrition: Goal: Risk of aspiration will decrease Outcome: Progressing   Problem: Ischemic Stroke/TIA Tissue Perfusion: Goal: Complications of ischemic stroke/TIA will be minimized Outcome: Progressing   Problem: Education: Goal: Knowledge of General Education information will improve Outcome: Progressing   Problem: Clinical Measurements: Goal: Ability to maintain clinical measurements within normal limits will improve Outcome: Progressing Goal: Will remain free from infection Outcome: Progressing Goal: Diagnostic test results will improve Outcome: Progressing   Problem: Pain Managment: Goal: General experience of comfort will improve Outcome: Progressing   Problem: Safety: Goal: Ability to remain free from injury will improve Outcome: Progressing

## 2017-07-05 NOTE — Evaluation (Signed)
Physical Therapy Evaluation Patient Details Name: Alyse I GrBenjamin Stainay MRN: [id number] DOB: [birth year] Today's Date: 07/05/2017   History of Present Illness  presented to ER secondary to R UE, hand weakness; admitted for acute TIA/CVA work-up.  CT/MRI negative for acute change.  Per discussion with MD during evaluation, plans to order c-spine MRI to rule out cervical pathology.  Clinical Impression  Upon evaluation, patient alert and oriented; follows all commands and demonstrates good insight/safety awareness.  R wrist and hand with generalized weakness (extensors > flexors), mild sensory and coordination deficits (decreased speed, accuracy with finger-nose and RAMPs).  Otherwise, UE/LE strength and ROM grossly symmetrical and WFL. Demonstrates ability to complete supine/sit with mod indep; sit/stand, basic transfers and gait (100') with RW, min assist.  Higher level balance deficits evident; very stiff gait pattern/mechanics, limited balance reactions evident. Would benefit from skilled PT to address above deficits and promote optimal return to PLOF; Recommend transition to HHPT upon discharge from acute hospitalization.     Follow Up Recommendations Home health PT    Equipment Recommendations       Recommendations for Other Services       Precautions / Restrictions Precautions Precautions: Fall Restrictions Weight Bearing Restrictions: No      Mobility  Bed Mobility Overal bed mobility: Modified Independent Bed Mobility: Supine to Sit              Transfers Overall transfer level: Needs assistance Equipment used: Rolling walker (2 wheeled) Transfers: Sit to/from Stand Sit to Stand: Min guard;Min assist         General transfer comment: uses posterior surface of bilat LEs against edge of bed to assit with lift off and stabilization  Ambulation/Gait Ambulation/Gait assistance: Min assist Ambulation Distance (Feet): 100 Feet Assistive device: Rolling walker (2 wheeled)        General Gait Details: reciprocal stepping pattern with stiff LE stepping pattern/movement; appears to overutilize hip adduct for stabilization during gait cycle.  Poor balance and ability to correct any balance perturbation or LOB  Stairs            Wheelchair Mobility    Modified Rankin (Stroke Patients Only)       Balance Overall balance assessment: Needs assistance Sitting-balance support: No upper extremity supported;Feet supported Sitting balance-Leahy Scale: Good     Standing balance support: Bilateral upper extremity supported Standing balance-Leahy Scale: Fair                               Pertinent Vitals/Pain Pain Assessment: No/denies pain    Home Living Family/patient expects to be discharged to:: Private residence(Senior apartment complex) Living Arrangements: Alone Available Help at Discharge: Family;Available PRN/intermittently Type of Home: Apartment Home Access: Level entry     Home Layout: One level Home Equipment: Walker - 2 wheels;Walker - 4 wheels      Prior Function Level of Independence: Independent with assistive device(s)         Comments: Mod indep with 4WRW for ADLs, household mobilization (to/from dining hall).  Does endorse multiple fall history (at least 10 in past six months, most recent 2-3 weeks prior).     Hand Dominance        Extremity/Trunk Assessment   Upper Extremity Assessment Upper Extremity Assessment: (R shoulder, elbow grossly WFL; wrist, hand and finger extension 3-/5, gross grasp/flexion 4-/5. Mild sensory deficit/paresthesia R wrist distally.  Delayed coordination, finger to nose and RAMPS R UE)  Lower Extremity Assessment Lower Extremity Assessment: Overall WFL for tasks assessed(grossly 4+/5 to 5/5 throughout; no sensory deficit)       Communication   Communication: No difficulties  Cognition Arousal/Alertness: Awake/alert Behavior During Therapy: WFL for tasks  assessed/performed Overall Cognitive Status: Within Functional Limits for tasks assessed                                        General Comments      Exercises Other Exercises Other Exercises: Reviewed options for HEP to R UE for use outside of therapy-grasping/opening containers, folding washcloths, turning pages, picking up items of various sizes.  Patient voiced understanding; will have OT follow up to review/instruct further   Assessment/Plan    PT Assessment Patient needs continued PT services  PT Problem List Decreased strength;Decreased range of motion;Decreased activity tolerance;Decreased balance;Decreased mobility;Decreased coordination;Decreased knowledge of use of DME;Decreased safety awareness;Decreased knowledge of precautions       PT Treatment Interventions DME instruction;Gait training;Functional mobility training;Therapeutic activities;Therapeutic exercise;Balance training;Cognitive remediation    PT Goals (Current goals can be found in the Care Plan section)  Acute Rehab PT Goals Patient Stated Goal: to get my right hand stronger PT Goal Formulation: With patient/family Time For Goal Achievement: 07/19/17 Potential to Achieve Goals: Good    Frequency Min 2X/week   Barriers to discharge Decreased caregiver support      Co-evaluation               AM-PAC PT "6 Clicks" Daily Activity  Outcome Measure Difficulty turning over in bed (including adjusting bedclothes, sheets and blankets)?: None Difficulty moving from lying on back to sitting on the side of the bed? : None Difficulty sitting down on and standing up from a chair with arms (e.g., wheelchair, bedside commode, etc,.)?: None Help needed moving to and from a bed to chair (including a wheelchair)?: A Little Help needed walking in hospital room?: A Little Help needed climbing 3-5 steps with a railing? : A Little 6 Click Score: 21    End of Session Equipment Utilized During  Treatment: Gait belt Activity Tolerance: Patient tolerated treatment well Patient left: in chair;with chair alarm set;with family/visitor present;with call bell/phone within reach Nurse Communication: Mobility status PT Visit Diagnosis: History of falling (Z91.81);Difficulty in walking, not elsewhere classified (R26.2);Muscle weakness (generalized) (M62.81)    Time: 1610-9604 PT Time Calculation (min) (ACUTE ONLY): 33 min   Charges:   PT Evaluation $PT Eval Moderate Complexity: 1 Mod PT Treatments $Gait Training: 8-22 mins $Therapeutic Exercise: 8-22 mins   PT G Codes:        Debby Clyne H. Manson Passey, PT, DPT, NCS 07/05/17, 3:18 PM [phone]

## 2017-07-06 ENCOUNTER — Observation Stay: Payer: Medicare Other

## 2017-07-06 DIAGNOSIS — R29898 Other symptoms and signs involving the musculoskeletal system: Secondary | ICD-10-CM | POA: Diagnosis not present

## 2017-07-06 LAB — GLUCOSE, CAPILLARY
GLUCOSE-CAPILLARY: 200 mg/dL — AB (ref 65–99)
GLUCOSE-CAPILLARY: 220 mg/dL — AB (ref 65–99)

## 2017-07-06 LAB — HEMOGLOBIN A1C
Hgb A1c MFr Bld: 7.5 % — ABNORMAL HIGH (ref 4.8–5.6)
Mean Plasma Glucose: 168.55 mg/dL

## 2017-07-06 MED ORDER — CLOPIDOGREL BISULFATE 75 MG PO TABS: 75.0000 mg | ORAL_TABLET | Freq: Every day | ORAL | 0 refills | Status: AC

## 2017-07-06 MED ORDER — CLOPIDOGREL BISULFATE 75 MG PO TABS: 75.0000 mg | ORAL_TABLET | Freq: Every day | ORAL | Status: DC

## 2017-07-06 MED ORDER — GADOBENATE DIMEGLUMINE 529 MG/ML IV SOLN
10.0000 mL | Freq: Once | INTRAVENOUS | Status: AC | PRN
  Administered 2017-07-06: 13:00:00 10 mL via INTRAVENOUS

## 2017-07-06 NOTE — Care Management Obs Status (Signed)
MEDICARE OBSERVATION STATUS NOTIFICATION   Patient Details  Name: Susan [name] MRN: [id number] Date of Birth: 07-04-1926   Medicare Observation Status Notification Given:  Yes    Gwenette GreetBrenda S Lark Runk, RN 07/06/2017, 8:15 AM

## 2017-07-06 NOTE — Progress Notes (Signed)
Subjective: Patient reports improvement in her RUE strength.  NO sensory complaints.    Objective: Current vital signs: BP (!) 141/60 (BP Location: Right Arm)   Pulse 69   Temp 98.7 F (37.1 C)   Resp 18   Ht 5\' 6"  (1.676 m)   Wt 51.4 kg (113 lb 6 oz)   SpO2 100%   BMI 18.30 kg/m  Vital signs in last 24 hours: Temp:  [98.4 F (36.9 C)-98.7 F (37.1 C)] 98.7 F (37.1 C) (03/25 1250) Pulse Rate:  [68-73] 69 (03/25 1250) Resp:  [14-18] 18 (03/25 1250) BP: (115-162)/(46-64) 141/60 (03/25 1250) SpO2:  [97 %-100 %] 100 % (03/25 1250)  Intake/Output from previous day: 03/24 0701 - 03/25 0700 In: 600 [P.O.:600] Out: -  Intake/Output this shift: No intake/output data recorded. Nutritional status: Fall precautions Diet heart healthy/carb modified Room service appropriate? Yes; Fluid consistency: Thin  Neurologic Exam: Mental Status: Alert, oriented, thought content appropriate.  Speech fluent without evidence of aphasia.  Able to follow 3 step commands without difficulty. Cranial Nerves: II: Discs flat bilaterally; Visual fields grossly normal, pupils equal, round, reactive to light and accommodation III,IV, VI: ptosis not present, extra-ocular motions intact bilaterally V,VII: decreased right NLF, facial light touch sensation normal bilaterally VIII: hearing normal bilaterally IX,X: gag reflex present XI: bilateral shoulder shrug XII: midline tongue extension Motor: Right :  Upper extremity   5/5                              Left:     Upper extremity   5/5             Lower extremity   5/5                                           Lower extremity   5/5 Right fingers curl slightly with pronator drift testing Sensory: Pinprick and light touch intact in BUE's Deep Tendon Reflexes: 1+ and symmetric in the UE's   Lab Results: Basic Metabolic Panel: Recent Labs  Lab 07/04/17 1537  NA 139  K 4.2  CL 106  CO2 25  GLUCOSE 168*  BUN 20  CREATININE 0.68  CALCIUM 9.9     Liver Function Tests: Recent Labs  Lab 07/04/17 1537  AST 32  ALT 20  ALKPHOS 61  BILITOT 0.5  PROT 9.0*  ALBUMIN 4.4   No results for input(s): LIPASE, AMYLASE in the last 168 hours. No results for input(s): AMMONIA in the last 168 hours.  CBC: Recent Labs  Lab 07/04/17 1537  WBC 6.5  NEUTROABS 4.8  HGB 11.8*  HCT 37.6  MCV 79.9*  PLT 222    Cardiac Enzymes: Recent Labs  Lab 07/04/17 1537  TROPONINI <0.03    Lipid Panel: Recent Labs  Lab 07/05/17 0357  CHOL 133  TRIG 43  HDL 55  CHOLHDL 2.4  VLDL 9  LDLCALC 69    CBG: Recent Labs  Lab 07/05/17 1205 07/05/17 1653 07/05/17 2131 07/06/17 0816 07/06/17 1252  GLUCAP 241* 164* 252* 200* 220*    Microbiology: No results found for this or any previous visit.  Coagulation Studies: Recent Labs    07/04/17 1537  LABPROT 13.0  INR 0.99    Imaging: Ct Head Wo Contrast  Result Date: 07/04/2017 CLINICAL DATA:  Right hand  weakness. EXAM: CT HEAD WITHOUT CONTRAST TECHNIQUE: Contiguous axial images were obtained from the base of the skull through the vertex without intravenous contrast. COMPARISON:  February 08, 2017 FINDINGS: Brain: No subdural, epidural, or subarachnoid hemorrhage. Cerebellum, brainstem, and basal cisterns are normal. Ventricles and sulci are prominent but stable. No mass effect or midline shift. Mild white matter changes are identified. No acute cortical ischemia or infarct is noted. Vascular: Calcified atherosclerosis seen in the intracranial carotids. Skull: Normal. Negative for fracture or focal lesion. Sinuses/Orbits: No acute finding. Other: None. IMPRESSION: No acute intracranial abnormality is seen to explain the patient's symptoms. Electronically Signed   By: Gerome Sam III M.D   On: 07/04/2017 16:41   Mr Brain Wo Contrast  Result Date: 07/05/2017 CLINICAL DATA:  TIA. Initial exam. Right upper extremity numbness and weakness beginning yesterday morning. EXAM: MRI HEAD  WITHOUT CONTRAST MRA HEAD WITHOUT CONTRAST TECHNIQUE: Multiplanar, multiecho pulse sequences of the brain and surrounding structures were obtained without intravenous contrast. Angiographic images of the head were obtained using MRA technique without contrast. COMPARISON:  CT head without contrast 07/04/2017. MRI brain 11/11/2007 FINDINGS: MRI HEAD FINDINGS Brain: The diffusion-weighted images demonstrate no acute or subacute infarction. Mild atrophy and white matter changes are likely within normal limits for age. Ventricles proportionate to the degree of atrophy. Internal auditory canals are within normal limits bilaterally. The brainstem and cerebellum are normal. Vascular: Flow is present in the major intracranial arteries. Skull and upper cervical spine: The skull base is within normal limits. The craniocervical junction is normal. Mild degenerative changes are present at C4-5. Marrow signal is within normal limits. Sinuses/Orbits: The paranasal sinuses and mastoid air cells are clear. Bilateral lens replacements are present. Globes and orbits are within normal limits. MRA HEAD FINDINGS Atherosclerotic irregularity is present within the cavernous internal carotid arteries bilaterally, left greater than right. There is no significant stenosis of greater than 50% relative to the more distal vessel. ICA termini are within normal limits bilaterally. Mild narrowing is present in the proximal right A1 segment. The left A1 segment and bilateral M1 segments are normal. MCA bifurcations are within normal limits. ACA and MCA branch vessels are normal proximally. There is some attenuation distal ACA and MCA branch vessels. The vertebral arteries are codominant. The PICA origins are visualized and normal. The basilar artery is normal. Both posterior cerebral arteries originate from basilar tip. A right posterior communicating artery contributes. Distal PCA branch vessel attenuation is noted bilaterally. IMPRESSION: 1.  Normal MRI appearance of the brain for age. No acute intracranial abnormality. 2. Mild degenerative changes at C4-5. 3. Atherosclerotic irregularity in the cavernous internal carotid arteries bilaterally without a focal stenosis of greater than 50%. 4. Mild diffuse distal small vessel disease without other proximal stenosis, aneurysm, or branch vessel occlusion within the circle-of-Willis. Electronically Signed   By: Marin Roberts M.D.   On: 07/05/2017 11:28   Mr Cervical Spine W Wo Contrast  Result Date: 07/06/2017 CLINICAL DATA:  Right arm and hand weakness.  No trauma. EXAM: MRI CERVICAL SPINE WITHOUT AND WITH CONTRAST TECHNIQUE: Multiplanar and multiecho pulse sequences of the cervical spine, to include the craniocervical junction and cervicothoracic junction, were obtained without and with intravenous contrast. CONTRAST:  10mL MULTIHANCE GADOBENATE DIMEGLUMINE 529 MG/ML IV SOLN COMPARISON:  None. FINDINGS: Alignment: Trace stepwise anterolisthesis at C5-C6 and C6-C7. Vertebrae: No fracture, evidence of discitis, or bone lesion. Cord: Normal signal and morphology.  No abnormal enhancement. Posterior Fossa, vertebral arteries, paraspinal tissues: Negative.  Disc levels: C2-C3:  Mild bilateral uncovertebral hypertrophy.  No stenosis. C3-C4: Moderate right and mild left uncovertebral hypertrophy resulting in mild bilateral neuroforaminal stenosis. No spinal canal stenosis. C4-C5: Small disc bulge. Severe right and moderate left facet uncovertebral hypertrophy. Severe right and moderate left neuroforaminal stenosis superiorly. The bilateral neural foramina are patent inferiorly. No spinal canal stenosis. C5-C6: Small disc bulge and mild left greater than right facet uncovertebral hypertrophy resulting in mild bilateral neuroforaminal stenosis. No spinal canal stenosis. C6-C7: Right greater than left facet uncovertebral hypertrophy. Mild right neuroforaminal stenosis. No spinal canal or left neuroforaminal  stenosis. C7-T1: Small biforaminal disc protrusions resulting in mild bilateral neuroforaminal stenosis, worse on the left. No spinal canal stenosis. IMPRESSION: 1. Multilevel degenerative changes of the cervical spine as described above. Severe narrowing of the right neural foramen superiorly at C4-C5, although the inferior aspect of the neural foramen is patent. 2. Mild bilateral neuroforaminal stenosis at C3-C4, C5-C6, and C7-T1. Mild right neuroforaminal stenosis at C6-C7. Electronically Signed   By: Obie DredgeWilliam T Derry M.D.   On: 07/06/2017 13:03   [place] Carotid Bilateral (at [facility] And [facility] Only)  Result Date: 07/05/2017 CLINICAL DATA:  84 year old with right-sided weakness. EXAM: BILATERAL CAROTID DUPLEX ULTRASOUND TECHNIQUE: Wallace CullensGray scale imaging, color Doppler and duplex ultrasound were performed of bilateral carotid and vertebral arteries in the neck. COMPARISON:  Brain MRI 07/05/2017 FINDINGS: Criteria: Quantification of carotid stenosis is based on velocity parameters that correlate the residual internal carotid diameter with NASCET-based stenosis levels, using the diameter of the distal internal carotid lumen as the denominator for stenosis measurement. The following velocity measurements were obtained: RIGHT ICA:  103 cm/sec CCA:  78 cm/sec SYSTOLIC ICA/CCA RATIO:  1.3 DIASTOLIC ICA/CCA RATIO:  1.2 ECA:  115 cm/sec LEFT ICA:  96 cm/sec CCA:  111 cm/sec SYSTOLIC ICA/CCA RATIO:  0.9 DIASTOLIC ICA/CCA RATIO:  1.0 ECA:  101 cm/sec RIGHT CAROTID ARTERY: Small focus of echogenic plaque in the distal common carotid artery and bulb. No significant stenosis at the bulb. External carotid artery is patent with normal waveform. Normal waveforms and velocities in the internal carotid artery. RIGHT VERTEBRAL ARTERY: Antegrade flow and normal waveform in the right vertebral artery. LEFT CAROTID ARTERY: Small amount of plaque at the left carotid bulb without significant stenosis. External carotid artery is patent with normal  waveform. Internal carotid artery is tortuous. Normal waveforms and velocities in the internal carotid artery. LEFT VERTEBRAL ARTERY: Antegrade flow and normal waveform in the left vertebral artery. IMPRESSION: Mild atherosclerotic disease in the carotid arteries, predominantly at the carotid bulbs. Estimated degree of stenosis in the internal carotid arteries is less than 50% bilaterally. Patent vertebral arteries with antegrade flow. Electronically Signed   By: Richarda OverlieAdam  Henn M.D.   On: 07/05/2017 15:20   Mr Maxine GlennMra Head/brain ZOWo Cm  Result Date: 07/05/2017 CLINICAL DATA:  TIA. Initial exam. Right upper extremity numbness and weakness beginning yesterday morning. EXAM: MRI HEAD WITHOUT CONTRAST MRA HEAD WITHOUT CONTRAST TECHNIQUE: Multiplanar, multiecho pulse sequences of the brain and surrounding structures were obtained without intravenous contrast. Angiographic images of the head were obtained using MRA technique without contrast. COMPARISON:  CT head without contrast 07/04/2017. MRI brain 11/11/2007 FINDINGS: MRI HEAD FINDINGS Brain: The diffusion-weighted images demonstrate no acute or subacute infarction. Mild atrophy and white matter changes are likely within normal limits for age. Ventricles proportionate to the degree of atrophy. Internal auditory canals are within normal limits bilaterally. The brainstem and cerebellum are normal. Vascular: Flow is present in  the major intracranial arteries. Skull and upper cervical spine: The skull base is within normal limits. The craniocervical junction is normal. Mild degenerative changes are present at C4-5. Marrow signal is within normal limits. Sinuses/Orbits: The paranasal sinuses and mastoid air cells are clear. Bilateral lens replacements are present. Globes and orbits are within normal limits. MRA HEAD FINDINGS Atherosclerotic irregularity is present within the cavernous internal carotid arteries bilaterally, left greater than right. There is no significant  stenosis of greater than 50% relative to the more distal vessel. ICA termini are within normal limits bilaterally. Mild narrowing is present in the proximal right A1 segment. The left A1 segment and bilateral M1 segments are normal. MCA bifurcations are within normal limits. ACA and MCA branch vessels are normal proximally. There is some attenuation distal ACA and MCA branch vessels. The vertebral arteries are codominant. The PICA origins are visualized and normal. The basilar artery is normal. Both posterior cerebral arteries originate from basilar tip. A right posterior communicating artery contributes. Distal PCA branch vessel attenuation is noted bilaterally. IMPRESSION: 1. Normal MRI appearance of the brain for age. No acute intracranial abnormality. 2. Mild degenerative changes at C4-5. 3. Atherosclerotic irregularity in the cavernous internal carotid arteries bilaterally without a focal stenosis of greater than 50%. 4. Mild diffuse distal small vessel disease without other proximal stenosis, aneurysm, or branch vessel occlusion within the circle-of-Willis. Electronically Signed   By: Marin Roberts M.D.   On: 07/05/2017 11:28    Medications:  I have reviewed the patient's current medications. Scheduled: . amLODipine  10 mg Oral QAC supper  . aspirin  300 mg Rectal Daily   Or  . aspirin  325 mg Oral Daily  . atorvastatin  20 mg Oral QPC supper  . calcium-vitamin D  2 tablet Oral BID  . cephALEXin  500 mg Oral BID  . clopidogrel  75 mg Oral Daily  . insulin aspart  0-5 Units Subcutaneous QHS  . insulin aspart  0-9 Units Subcutaneous TID WC  . multivitamin with minerals  1 tablet Oral Daily  . quinapril  40 mg Oral BID WC  . ascorbic acid  500 mg Oral QPM    Assessment/Plan: Patient right upper extremity improved.  MRI of the cervical spine shows multilevel degenerative changes, most severe at right C4-5.  This is not particularly consistent with hand weakness.   MRI of the brain sis  not show an acute event but can not rule out TIA.  Patient with vascular risk factors.  On ASA at home.  Carotid dopplers show no evidence of hemodynamically significant stenosis.  Echocardiogram shows no cardiac source of emboli with an EF of 55-60%.  LDL 69.  Recommendations: 1.  Would D/C ASA and start Plavix at 75mg  daily 2.  Continue statin 3.  A1c 4.  Patient to be followed up by neurology on an outpatient basis    LOS: 0 days   Thana Farr, MD Neurology [phone] 07/06/2017  2:31 PM

## 2017-07-06 NOTE — Discharge Instructions (Signed)
Resume diet and activity as before ° ° °

## 2017-07-06 NOTE — Progress Notes (Signed)
Patient VS stable. A&Ox4 with minimal confusion. IV removed, no adverse events noted. First dose medication and discharge instructions given to patient, denies questions. Family to transport patient home with Home health.  Leroy SeaKimberly Eugenie Harewood, LPN

## 2017-07-06 NOTE — Care Management Note (Addendum)
Case Management Note  Patient Details  Name: Susan [name] MRN: [id number] Date of Birth: [birth year]  Subjective/Objective:  Admitted [facility] with the diagnosis of CVA under observation status. Lives alone. [name] is daughter [phone]. Last seen Dr, Allena KatzPatel in November 2018. [facility] [address]. [place] [address] [place]. Rolling walker, cane, and raided toilet seat in her handicap apartment. Larey SeatFell prior to this admission. Good appetite. Takes care of all basic activities of daily living herself. Daughter will transport                  Action/Plan: Physical therapy evaluation completed. Recommending Home with home health and physical therapy. Chose [facility]. Feliberto GottronJason Hinton, [facility] representative updated.   Expected Discharge Date:                  Expected Discharge Plan:     In-House Referral:   yes  Discharge planning Services   yes  Post Acute Care Choice:   yes Choice offered to:   patient  DME Arranged:    DME Agency:     HH Arranged:   yes HH Agency:   [facility]  Status of Service:     If discussed at Long Length of Stay Meetings, dates discussed:    Additional Comments:  Gwenette GreetBrenda S Demonica Farrey, RN MSN CCM Care Management [phone] 07/06/2017, 8:18 AM

## 2017-07-06 NOTE — Evaluation (Signed)
Occupational Therapy Evaluation Patient Details Name: Susan [name] MRN: [id number] DOB: 1926/12/31 Today's Date: 07/06/2017    History of Present Illness Pt. is an 84 y.o. female who was admitted to [facility] with RUE weakness. Pt. was admitted to further evaluation of an Acute TIA/CVA.   Clinical Impression   Pt. Presents with weakness, impaired coordination, limited activity tolerance, and limited functional mobility which limits her  ability to complete basic ADL and IADL functioning. Pt. Resides at home alone in an independent senior living apartment. Pt. was independent with ADLs, and IADL functioning: including: meal preparation, and medication management. Pt. Education was provided about [facility] exercises for the right hand. Pt. was provided with a visual handout of [facility] exercises, and was encouraged to engage her RUE, and hand in daily ADL tasks. Pt. Could benefit from OT services for ADL training, A/E training, neuromuscular re-ed, and pt. education about home modification, and DME. Pt. Plans to return home upon discharge, and could benefit from follow-up HHOT services.    Follow Up Recommendations  Home health OT    Equipment Recommendations       Recommendations for Other Services       Precautions / Restrictions Precautions Precautions: Fall Restrictions Weight Bearing Restrictions: No      Mobility Bed Mobility Overal bed mobility: Modified Independent Bed Mobility: Supine to Sit              Transfers Overall transfer level: Needs assistance Equipment used: Rolling walker (2 wheeled) Transfers: Sit to/from Stand Sit to Stand: Min guard;Min assist              Balance                                           ADL either performed or assessed with clinical judgement   ADL Overall ADL's : Needs assistance/impaired Eating/Feeding: Set up;Independent   Grooming: Set up;Minimal assistance   Upper Body Bathing: Set up;Minimal assistance    Lower Body Bathing: Minimal assistance   Upper Body Dressing : Minimal assistance   Lower Body Dressing: Minimal assistance               Functional mobility during ADLs: Minimal assistance General ADL Comments: Pt. education was provided engaging her right hand during ADL, and IADL tasks.     Vision Patient Visual Report: No change from baseline Vision Assessment?: No apparent visual deficits     Perception     Praxis      Pertinent Vitals/Pain Pain Assessment: No/denies pain     Hand Dominance (Ambidextrous)   Extremity/Trunk Assessment Upper Extremity Assessment Upper Extremity Assessment: RUE deficits/detail RUE Deficits / Details: 3+/5  shoulder flexion, 3/5 shoulder abduction, 3+/5 elbow flexion, extension, 3/5 wrist extension.  RUE Coordination: decreased fine motor;decreased gross motor           Communication Communication Communication: No difficulties   Cognition Arousal/Alertness: Awake/alert Behavior During Therapy: WFL for tasks assessed/performed Overall Cognitive Status: Within Functional Limits for tasks assessed                                     General Comments       Exercises     Shoulder Instructions      Home Living Family/patient expects to be discharged to:: Private  residence(Senior independent apartment living.) Living Arrangements: Alone Available Help at Discharge: Family;Available PRN/intermittently Type of Home: Apartment Home Access: Level entry     Home Layout: One level     Bathroom Shower/Tub: Walk-in shower;Curtain         Home Equipment: Environmental consultantWalker - 2 wheels;Walker - 4 wheels;Bedside commode;Shower seat          Prior Functioning/Environment Level of Independence: Independent with assistive device(s)        Comments: Mod indep with 4WRW for ADLs, household mobilization (to/from dining hall).  Does endorse multiple fall history (at least 10 in past six months, most recent 2-3 weeks  prior).        OT Problem List: Decreased strength;Decreased activity tolerance;Decreased coordination;Impaired UE functional use      OT Treatment/Interventions: Self-care/ADL training;Therapeutic exercise;Patient/family education;DME and/or AE instruction;Therapeutic activities    OT Goals(Current goals can be found in the care plan section) Acute Rehab OT Goals Patient Stated Goal: To get stronger OT Goal Formulation: With patient Potential to Achieve Goals: Good  OT Frequency: Min 1X/week   Barriers to D/C:            Co-evaluation              AM-PAC PT "6 Clicks" Daily Activity     Outcome Measure Help from another person eating meals?: None Help from another person taking care of personal grooming?: A Little Help from another person toileting, which includes using toliet, bedpan, or urinal?: A Little Help from another person bathing (including washing, rinsing, drying)?: A Little Help from another person to put on and taking off regular upper body clothing?: A Little Help from another person to put on and taking off regular lower body clothing?: A Little 6 Click Score: 19   End of Session Equipment Utilized During Treatment: Gait belt  Activity Tolerance: Patient tolerated treatment well Patient left: in bed  OT Visit Diagnosis: Muscle weakness (generalized) (M62.81);Other (comment)(lack of coordination)                Time: [id number] OT Time Calculation (min): 31 min Charges:  OT General Charges $OT Visit: 1 Visit OT Evaluation $OT Eval High Complexity: 1 High G-Codes:     Olegario MessierElaine Alvah Gilder, MS, OTR/L   Olegario MessierElaine Saajan Willmon, MS, OTR/L 07/06/2017, 12:33 PM

## 2017-07-06 NOTE — Progress Notes (Signed)
SLP Cancellation Note  Patient Details Name: Susan [name] MRN: [id number] DOB: [birth year]   Cancelled treatment:       Reason Eval/Treat Not Completed: SLP screened, no needs identified, will sign off. Reviewed chart notes. Consulted NSG then pt and MD re: status this morning. Pt denied any difficulty swallowing and is currently on a regular diet; tolerates swallowing pills in PUREE w/ NSG - pt stated she has "chewed" her medicines for several years for easier swallowing "through the throat and upper esophagus where it is slow". Pt had a MBSS several years ago as well w/ no aspiration noted per pt. Pt conversed at conversational level w/out deficits noted; pt denied any speech-language deficits.  No further skilled ST services indicated as pt appears at her baseline. Pt agreed. NSG to reconsult if any change in status.     Jerilynn SomKatherine Watson, MS, CCC-SLP Watson,Katherine 07/06/2017, 9:56 AM

## 2017-07-08 LAB — URINE CULTURE: Culture: >=100000 — AB

## 2017-07-10 NOTE — Discharge Summary (Signed)
[facility] [place] [facility]   PATIENT NAME: Susan [name]    MR#:  [id number]  DATE OF BIRTH:  [birth year]  DATE OF ADMISSION:  07/04/2017 ADMITTING PHYSICIAN: Ihor Austin, MD  DATE OF DISCHARGE: 07/06/2017  3:44 PM  PRIMARY CARE PHYSICIAN: [name], MD   ADMISSION DIAGNOSIS:  Weakness of right hand [R29.898] Ischemic stroke (HCC) [I63.9]  DISCHARGE DIAGNOSIS:  Active Problems:   CVA (cerebral vascular accident) (HCC)   SECONDARY DIAGNOSIS:   Past Medical History:  Diagnosis Date  . Anemia   . Balance problem   . Diabetes mellitus without complication (HCC)   . Diverticulosis   . Heart murmur   . Hypercholesteremia   . Hypertension   . Stroke [facility])    TIA 2006  . TIA (transient ischemic attack) 2006     ADMITTING HISTORY  HISTORY OF PRESENT ILLNESS: Susan [name]  is a 84 y.o. female with a known history of diabetes mellitus type 2, hyperlipidemia, hypertension, CVA in the past presented to the emergency room with weakness in the right hand and right arm.  Patient woke up this morning and noticed some weakness in the right hand.  No tingling or numbness sensation.No headache dizziness and blurry vision. No complaints of slurred speech.  Patient was worked up with CT head which showed no acute abnormality.  Hospitalist service was consulted for further care.  HOSPITAL COURSE:   * Right hand weakness likely TIA as discussed with Dr. Thad Ranger MRI brain negative. MRI C-spine showed nothing acute or no significant stenosis. Patient has been set up with home health physical therapy and occupational therapy. Aspirin changed to Plavix.  Statin.  * UTI Started on keflex  * DM2 SSI  * HTN ON home meds  Patient stable for discharge home.    CONSULTS OBTAINED:  Treatment Team:  Pauletta Browns, MD  DRUG ALLERGIES:   Allergies  Allergen Reactions  . Atenolol Other (See Comments)    "affected my heart rate"  . Naprosyn [Naproxen]  Other (See Comments)    GI Bleed    DISCHARGE MEDICATIONS:   Allergies as of 07/06/2017      Reactions   Atenolol Other (See Comments)   "affected my heart rate"   Naprosyn [naproxen] Other (See Comments)   GI Bleed      Medication List    STOP taking these medications   aspirin 81 MG tablet     TAKE these medications   amLODipine 10 MG tablet Commonly known as:  NORVASC Take 10 mg by mouth daily before supper.   ascorbic acid 500 MG tablet Commonly known as:  VITAMIN C Take 500 mg by mouth every evening.   atorvastatin 20 MG tablet Commonly known as:  LIPITOR Take 20 mg by mouth daily after supper.   Calcium Carbonate-Vitamin D 600-400 MG-UNIT tablet Take 1 tablet by mouth 2 (two) times daily.   clopidogrel 75 MG tablet Commonly known as:  PLAVIX Take 1 tablet (75 mg total) by mouth daily.   glipiZIDE 5 MG 24 hr tablet Commonly known as:  GLUCOTROL XL Take 5 mg by mouth 2 (two) times daily.   metFORMIN 500 MG tablet Commonly known as:  GLUCOPHAGE Take 500 mg by mouth 2 (two) times daily with a meal.   multivitamin with minerals Tabs tablet Take 1 tablet by mouth daily.   quinapril 40 MG tablet Commonly known as:  ACCUPRIL Take 40 mg by mouth 2 (two) times daily. Breakfast and dinner.  Today   VITAL SIGNS:  Blood pressure (!) 141/60, pulse 69, temperature 98.7 F (37.1 C), resp. rate 18, height 5\' 6"  (1.676 m), weight 51.4 kg (113 lb 6 oz), SpO2 100 %.  I/O:  No intake or output data in the 24 hours ending 07/10/17 1632  PHYSICAL EXAMINATION:  Physical Exam  GENERAL:  84 y.o.-year-old patient lying in the bed with no acute distress.  LUNGS: Normal breath sounds bilaterally, no wheezing, rales,rhonchi or crepitation. No use of accessory muscles of respiration.  CARDIOVASCULAR: S1, S2 normal. No murmurs, rubs, or gallops.  ABDOMEN: Soft, non-tender, non-distended. Bowel sounds present. No organomegaly or mass.  NEUROLOGIC: Moves all 4  extremities. PSYCHIATRIC: The patient is alert and oriented x 3.  SKIN: No obvious rash, lesion, or ulcer.   DATA REVIEW:   CBC Recent Labs  Lab 07/04/17 1537  WBC 6.5  HGB 11.8*  HCT 37.6  PLT 222    Chemistries  Recent Labs  Lab 07/04/17 1537  NA 139  K 4.2  CL 106  CO2 25  GLUCOSE 168*  BUN 20  CREATININE 0.68  CALCIUM 9.9  AST 32  ALT 20  ALKPHOS 61  BILITOT 0.5    Cardiac Enzymes Recent Labs  Lab 07/04/17 1537  TROPONINI <0.03    Microbiology Results  Results for orders placed or performed during the hospital encounter of 07/04/17  Urine Culture     Status: Abnormal   Collection Time: 07/05/17  3:44 PM  Result Value Ref Range Status   Specimen Description   Final    URINE, RANDOM Performed at [facility], [address] [place], [place] [address]    Special Requests   Final    NONE Performed at [facility], [address] [place], [place] [address]    Culture >=100,000 COLONIES/mL ESCHERICHIA COLI (A)  Final   Report Status 07/08/2017 FINAL  Final   Organism ID, Bacteria ESCHERICHIA COLI (A)  Final      Susceptibility   Escherichia coli - MIC*    AMPICILLIN 4 SENSITIVE Sensitive     CEFAZOLIN <=4 SENSITIVE Sensitive     CEFTRIAXONE <=1 SENSITIVE Sensitive     CIPROFLOXACIN <=0.25 SENSITIVE Sensitive     GENTAMICIN <=1 SENSITIVE Sensitive     IMIPENEM <=0.25 SENSITIVE Sensitive     NITROFURANTOIN <=16 SENSITIVE Sensitive     TRIMETH/SULFA <=20 SENSITIVE Sensitive     AMPICILLIN/SULBACTAM <=2 SENSITIVE Sensitive     PIP/TAZO <=4 SENSITIVE Sensitive     Extended ESBL NEGATIVE Sensitive     * >=100,000 COLONIES/mL ESCHERICHIA COLI    RADIOLOGY:  No results found.  Follow up with PCP in 1 week.  Management plans discussed with the patient, family and they are in agreement.  CODE STATUS:  Code Status History    Date Active Date Inactive Code Status Order ID Comments User Context   07/04/2017 2042 07/06/2017 1931  Full Code [id number]  Ihor AustinPyreddy, Pavan, MD Inpatient      TOTAL TIME TAKING CARE OF THIS PATIENT ON DAY OF DISCHARGE: more than 30 minutes.   Molinda BailiffSrikar R Eldine Rencher M.D on 07/10/2017 at 4:33 PM  Between 7am to 6pm - Pager - [phone]  After 6pm go to [email or web address] - password [email or web address]  [facility]  Office  [phone]  CC: Primary care physician; Hillery AldoPatel, Sarah, MD  Note: This dictation was prepared with Dragon dictation along with smaller phrase technology. Any transcriptional errors that result from this process are  unintentional.

## 2017-11-24 ENCOUNTER — Emergency Department
Admission: EM | Admit: 2017-11-24 | Discharge: 2017-11-24 | Disposition: A | Discharge status: Home / Self Care | Payer: Medicare Other | Attending: Emergency Medicine | Admitting: Emergency Medicine

## 2017-11-24 ENCOUNTER — Encounter: Payer: Self-pay | Admitting: Emergency Medicine

## 2017-11-24 ENCOUNTER — Emergency Department: Payer: Medicare Other

## 2017-11-24 ENCOUNTER — Other Ambulatory Visit: Payer: Self-pay

## 2017-11-24 DIAGNOSIS — E119 Type 2 diabetes mellitus without complications: Secondary | ICD-10-CM | POA: Insufficient documentation

## 2017-11-24 DIAGNOSIS — M25562 Pain in left knee: Secondary | ICD-10-CM | POA: Diagnosis present

## 2017-11-24 DIAGNOSIS — M1712 Unilateral primary osteoarthritis, left knee: Secondary | ICD-10-CM

## 2017-11-24 DIAGNOSIS — Z87891 Personal history of nicotine dependence: Secondary | ICD-10-CM | POA: Insufficient documentation

## 2017-11-24 DIAGNOSIS — Z79899 Other long term (current) drug therapy: Secondary | ICD-10-CM | POA: Insufficient documentation

## 2017-11-24 DIAGNOSIS — Z8673 Personal history of transient ischemic attack (TIA), and cerebral infarction without residual deficits: Secondary | ICD-10-CM | POA: Insufficient documentation

## 2017-11-24 DIAGNOSIS — Z7984 Long term (current) use of oral hypoglycemic drugs: Secondary | ICD-10-CM | POA: Insufficient documentation

## 2017-11-24 DIAGNOSIS — I10 Essential (primary) hypertension: Secondary | ICD-10-CM | POA: Insufficient documentation

## 2017-11-24 DIAGNOSIS — E7800 Pure hypercholesterolemia, unspecified: Secondary | ICD-10-CM | POA: Diagnosis not present

## 2017-11-24 MED ORDER — TRAMADOL HCL 50 MG PO TABS: 50.0000 mg | ORAL_TABLET | Freq: Three times a day (TID) | ORAL | 0 refills | Status: DC | PRN

## 2017-11-24 NOTE — ED Triage Notes (Signed)
Pt states she has been falling because her left leg just gives out, denies any pain or injury to that leg.

## 2017-11-24 NOTE — Discharge Instructions (Addendum)
Keep your appointment with Dr. Allena KatzPatel as scheduled.  You may take 1 Tylenol twice a day if needed for knee pain and for severe knee pain take tramadol 1 every 8 hours.  Tramadol may cause drowsiness and increase your risk for falling.  Be aware that when you take this that you should not the walking a lot.

## 2017-11-24 NOTE — ED Provider Notes (Signed)
[facility] Emergency Department Provider Note  ____________________________________________   First MD Initiated Contact with Patient 11/24/17 1036     (approximate)  I have reviewed the triage vital signs and the nursing notes.   HISTORY  Chief Complaint Fall   HPI Susan [name] is a 84 y.o. female is here with complaint of left knee pain.  Patient states that she has not actually fallen but states that her knee has "given out" and at those times she has some knee pain.  She also has had some decreased range of motion in comparison to her right which is concerning to her.  Patient states she has not had to take any over-the-counter medication for her knee.  She also has not seen her PCP specifically for her knee but states she has an appointment with her doctor next month.  At this time she rates her pain as 0/10.   Past Medical History:  Diagnosis Date  . Anemia   . Balance problem   . Diabetes mellitus without complication (HCC)   . Diverticulosis   . Heart murmur   . Hypercholesteremia   . Hypertension   . Stroke [facility])    TIA 2006  . TIA (transient ischemic attack) 2006    Patient Active Problem List   Diagnosis Date Noted  . CVA (cerebral vascular accident) (HCC) 07/04/2017    Past Surgical History:  Procedure Laterality Date  . CATARACT EXTRACTION W/PHACO Left 05/22/2015   Procedure: CATARACT EXTRACTION PHACO AND INTRAOCULAR LENS PLACEMENT (IOC);  Surgeon: Galen Manila, MD;  Location: [facility] ORS;  Service: Ophthalmology;  Laterality: Left;  [place]: 01:07.9 AP%: 23.0 CDE: 15.62 Lot# [id number]  . CATARACT EXTRACTION W/PHACO Right 06/12/2015   Procedure: CATARACT EXTRACTION PHACO AND INTRAOCULAR LENS PLACEMENT (IOC);  Surgeon: Galen Manila, MD;  Location: [facility] ORS;  Service: Ophthalmology;  Laterality: Right;  [place] 00:57 AP% 17.1 CDE 9.86 fluid pack lot # [id number]  . COLON SURGERY     resection  . ECTOPIC PREGNANCY SURGERY    . HERNIA  REPAIR    . TONSILLECTOMY      Prior to Admission medications   Medication Sig Start Date End Date Taking? Authorizing Provider  amLODipine (NORVASC) 10 MG tablet Take 10 mg by mouth daily before supper.    [provider]  ascorbic acid (VITAMIN C) 500 MG tablet Take 500 mg by mouth every evening.    [provider]  atorvastatin (LIPITOR) 20 MG tablet Take 20 mg by mouth daily after supper.    [provider]  Calcium Carbonate-Vitamin D 600-400 MG-UNIT tablet Take 1 tablet by mouth 2 (two) times daily.    [provider]  glipiZIDE (GLUCOTROL XL) 5 MG 24 hr tablet Take 5 mg by mouth 2 (two) times daily.     [provider]  metFORMIN (GLUCOPHAGE) 500 MG tablet Take 500 mg by mouth 2 (two) times daily with a meal.     [provider]  Multiple Vitamin (MULTIVITAMIN WITH MINERALS) TABS tablet Take 1 tablet by mouth daily.    [provider]  quinapril (ACCUPRIL) 40 MG tablet Take 40 mg by mouth 2 (two) times daily. Breakfast and dinner.     [provider]  traMADol (ULTRAM) 50 MG tablet Take 1 tablet (50 mg total) by mouth every 8 (eight) hours as needed. 11/24/17   Tommi Rumps, PA-C    Allergies Atenolol and Naprosyn [naproxen]  No family history on file.  Social  History Social History   Tobacco Use  . Smoking status: Former Games developermoker  . Smokeless tobacco: Former Engineer, waterUser  Substance Use Topics  . Alcohol use: No  . Drug use: Not on file    Review of Systems Constitutional: No fever/chills Cardiovascular: Denies chest pain. Respiratory: Denies shortness of breath. Musculoskeletal: Positive for left knee pain intermittently. Skin: Negative for rash. Neurological: Negative for headaches, focal weakness or numbness. ___________________________________________   PHYSICAL EXAM:  VITAL SIGNS: ED Triage Vitals  Enc Vitals Group     BP 11/24/17 1006 (!) 193/68     Pulse Rate 11/24/17 1005 92     Resp  11/24/17 1005 18     Temp 11/24/17 1006 98.2 F (36.8 C)     Temp Source 11/24/17 1006 Oral     SpO2 11/24/17 1005 100 %     Weight 11/24/17 1006 118 lb (53.5 kg)     Height 11/24/17 1006 5\' 6"  (1.676 m)     Head Circumference --      Peak Flow --      Pain Score 11/24/17 1006 0     Pain Loc --      Pain Edu? --      Excl. in GC? --    Constitutional: Alert and oriented. Well appearing and in no acute distress. Eyes: Conjunctivae are normal.  Head: Atraumatic. Neck: No stridor.   Cardiovascular: Normal rate, regular rhythm. Grossly normal heart sounds.  Good peripheral circulation. Respiratory: Normal respiratory effort.  No retractions. Lungs CTAB. Musculoskeletal: Examination of left knee there is no gross deformity and no effusion present.  There is a very degenerative-looking joint with decreased range of motion and crepitus.  Skin is intact.  No discoloration is noted.  No difficulty with range of motion of the left hip and there is no edema around the left ankle. Neurologic:  Normal speech and language. No gross focal neurologic deficits are appreciated.  Skin:  Skin is warm, dry and intact.  Psychiatric: Mood and affect are normal. Speech and behavior are normal.  ____________________________________________   LABS (all labs ordered are listed, but only abnormal results are displayed)  Labs Reviewed - No data to display  RADIOLOGY  ED MD interpretation:   Left knee x-ray with moderate to severe osteoarthritis.  Official radiology report(s): Dg Knee Complete 4 Views Left  Result Date: 11/24/2017 CLINICAL DATA:  84 year old whose LEFT leg has "given out" twice in the past 1 month. LEFT knee pain and limited range of motion. No known injury. EXAM: LEFT KNEE - COMPLETE 4+ VIEW COMPARISON:  None. FINDINGS: No evidence of acute, subacute or healed fractures. Severe MEDIAL compartment joint space narrowing with associated spurring. Mild LATERAL and patellofemoral compartment  joint space narrowing. Chondrocalcinosis involving the MEDIAL and LATERAL menisci. Mild osseous demineralization. Femoropopliteal and tibioperoneal artery atherosclerosis. 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 Electronically Signed   By: Hulan Saashomas  Lawrence M.D.   On: 11/24/2017 11:12   ____________________________________________   PROCEDURES  Procedure(s) performed: None  Procedures  Critical Care performed: No  ____________________________________________   INITIAL IMPRESSION / ASSESSMENT AND PLAN / ED COURSE  As part of my medical decision making, I reviewed the following data within the electronic MEDICAL RECORD NUMBER Notes from prior ED visits and [place] Controlled Substance Database  Patient is encouraged to follow-up with her PCP in which she has an appointment next month.  She may need to call and make an  appointment for earlier if she continues to have problems with her knee however she is aware that she has severe osteoarthritis of her left knee.  Patient will take Tylenol as needed.  She was given a prescription for tramadol every 8 hours if needed for severe pain but warned that this could cause drowsiness and increase her risk for falling.  I discouraged her against anti-inflammatories at her age it would be more problematic for GI upset or GI bleed.  ____________________________________________   FINAL CLINICAL IMPRESSION(S) / ED DIAGNOSES  Final diagnoses:  Osteoarthritis of left knee, unspecified osteoarthritis type     ED Discharge Orders         Ordered    traMADol (ULTRAM) 50 MG tablet  Every 8 hours PRN     11/24/17 1129           Note:  This document was prepared using Dragon voice recognition software and may include unintentional dictation errors.    Tommi RumpsSummers, Rhonda L, PA-C 11/24/17 1232    Arnaldo NatalMalinda, Paul F, MD 11/24/17 1537

## 2017-11-24 NOTE — ED Notes (Signed)
See triage note  Presents with left leg "weakness"  States her left leg gave out about 1 month ago and again last week  Denies any fall but states she is having some pain and limited movement to left knee

## 2021-05-28 ENCOUNTER — Observation Stay
Admission: EM | Admit: 2021-05-28 | Discharge: 2021-05-30 | Disposition: A | Discharge status: Home Health | Payer: Medicare Other | Attending: Internal Medicine | Admitting: Internal Medicine

## 2021-05-28 ENCOUNTER — Emergency Department: Payer: Medicare Other

## 2021-05-28 DIAGNOSIS — Z8673 Personal history of transient ischemic attack (TIA), and cerebral infarction without residual deficits: Secondary | ICD-10-CM | POA: Diagnosis not present

## 2021-05-28 DIAGNOSIS — K922 Gastrointestinal hemorrhage, unspecified: Secondary | ICD-10-CM | POA: Diagnosis not present

## 2021-05-28 DIAGNOSIS — Z20822 Contact with and (suspected) exposure to covid-19: Secondary | ICD-10-CM | POA: Diagnosis not present

## 2021-05-28 DIAGNOSIS — Z79899 Other long term (current) drug therapy: Secondary | ICD-10-CM | POA: Insufficient documentation

## 2021-05-28 DIAGNOSIS — Z7982 Long term (current) use of aspirin: Secondary | ICD-10-CM | POA: Diagnosis not present

## 2021-05-28 DIAGNOSIS — R2689 Other abnormalities of gait and mobility: Secondary | ICD-10-CM | POA: Diagnosis not present

## 2021-05-28 DIAGNOSIS — K921 Melena: Secondary | ICD-10-CM

## 2021-05-28 DIAGNOSIS — R42 Dizziness and giddiness: Secondary | ICD-10-CM

## 2021-05-28 DIAGNOSIS — I10 Essential (primary) hypertension: Secondary | ICD-10-CM | POA: Insufficient documentation

## 2021-05-28 DIAGNOSIS — E119 Type 2 diabetes mellitus without complications: Secondary | ICD-10-CM | POA: Insufficient documentation

## 2021-05-28 DIAGNOSIS — N2889 Other specified disorders of kidney and ureter: Secondary | ICD-10-CM | POA: Insufficient documentation

## 2021-05-28 DIAGNOSIS — Z87891 Personal history of nicotine dependence: Secondary | ICD-10-CM | POA: Insufficient documentation

## 2021-05-28 DIAGNOSIS — K625 Hemorrhage of anus and rectum: Secondary | ICD-10-CM | POA: Diagnosis present

## 2021-05-28 DIAGNOSIS — D649 Anemia, unspecified: Secondary | ICD-10-CM | POA: Insufficient documentation

## 2021-05-28 DIAGNOSIS — Z7984 Long term (current) use of oral hypoglycemic drugs: Secondary | ICD-10-CM | POA: Diagnosis not present

## 2021-05-28 LAB — BASIC METABOLIC PANEL
Anion gap: 6 (ref 5–15)
BUN: 18 mg/dL (ref 8–23)
CO2: 27 mmol/L (ref 22–32)
Calcium: 9.7 mg/dL (ref 8.9–10.3)
Chloride: 105 mmol/L (ref 98–111)
Creatinine, Ser: 0.79 mg/dL (ref 0.44–1.00)
GFR, Estimated: >60 mL/min (ref >60)
Glucose, Bld: 169 mg/dL — ABNORMAL HIGH (ref 70–99)
Potassium: 4.1 mmol/L (ref 3.5–5.1)
Sodium: 138 mmol/L (ref 135–145)

## 2021-05-28 LAB — CBC
HCT: 33.8 % — ABNORMAL LOW (ref 36.0–46.0)
Hemoglobin: 10.5 g/dL — ABNORMAL LOW (ref 12.0–15.0)
MCH: 27.6 pg (ref 26.0–34.0)
MCHC: 31.1 g/dL (ref 30.0–36.0)
MCV: 88.7 fL (ref 80.0–100.0)
Platelets: 255 10*3/uL (ref 150–400)
RBC: 3.81 MIL/uL — ABNORMAL LOW (ref 3.87–5.11)
RDW: 13.7 % (ref 11.5–15.5)
WBC: 7.2 10*3/uL (ref 4.0–10.5)
nRBC: 0 % (ref 0.0–0.2)

## 2021-05-28 LAB — RESP PANEL BY RT-PCR (FLU A&B, COVID) ARPGX2
Influenza A by PCR: NEGATIVE
Influenza B by PCR: NEGATIVE
SARS Coronavirus 2 by RT PCR: NEGATIVE

## 2021-05-28 LAB — TROPONIN I (HIGH SENSITIVITY): Troponin I (High Sensitivity): 10 ng/L (ref <18)

## 2021-05-28 NOTE — ED Notes (Signed)
First Nurse Note:  Pt to ED via [facility] from home for dizziness. Per EMS pt had some rectal bleeding over the weekend but none today. Pt HTN but has hx/o same. Pt tachy at 114. Pt is wheelchair bound. Pt is in NAD.

## 2021-05-28 NOTE — ED Provider Notes (Signed)
[facility] Provider Note    Event Date/Time   First MD Initiated Contact with Patient 05/28/21 2256     (approximate)   History   Dizziness, Weakness, and Rectal Bleeding   HPI  Susan [name] is a 88 y.o. female who presents to the ED for evaluation of Dizziness, Weakness, and Rectal Bleeding   I review discharge summary from medical admission back in 2019 for right-sided weakness and possible TIA.  Not much medical history in our chart since then.  She has a history of HTN, HLD, DM  Patient lives at home by herself, typically wheelchair-bound these days.  Handles her own ADLs.  She presents to the ED today, by POV, and accompanied by her granddaughter and great grandson for evaluation of positional dizziness in the setting of rectal bleeding.  She reports feeling soreness to her lower abdomen, more so on the right side on Saturday, 3 days ago, she then had a bowel movement that had red blood in it.  She reports 2-3 episodes total of hematochezia associated with bowel movements.  Denies any other bleeding diatheses such as vaginal bleeding, hematuria, epistaxis or hematemesis.  She reports no further episodes of abdominal pain since Saturday.  She does report positional dizziness that is different from her history of vertigo, particularly when she changes positions.  Denies any falls or syncopal episodes.  Surgical history of colonic resection and reanastomosis in 2014.  Thinks she had a colonoscopy around then, but none more recently.  Physical Exam   Triage Vital Signs: ED Triage Vitals  Enc Vitals Group     BP 05/28/21 1929 (!) 186/83     Pulse Rate 05/28/21 1929 96     Resp 05/28/21 1929 18     Temp 05/28/21 1929 98.7 F (37.1 C)     Temp Source 05/28/21 1929 Oral     SpO2 05/28/21 1929 98 %     Weight --      Height --      Head Circumference --      Peak Flow --      Pain Score 05/28/21 1921 0     Pain Loc --      Pain Edu? --      Excl. in  [place]? --     Most recent vital signs: Vitals:   05/28/21 2200 05/29/21 0045  BP: (!) 187/69 (!) 166/85  Pulse: 67 83  Resp: 17 15  Temp:  98.7 F (37.1 C)  SpO2: 100% 100%    General: Awake, no distress.  Pleasant and conversational.  Provides full history herself. CV:  Good peripheral perfusion.  Resp:  Normal effort.  Abd:  No distention.  Soft and benign.  Well-healed midline infraumbilical surgical incision, with small palpable fascial defect but no evidence of incarcerated hernia.  No pain. MSK:  No deformity noted.  Neuro:  No focal deficits appreciated. Other:     ED Results / Procedures / Treatments   Labs (all labs ordered are listed, but only abnormal results are displayed) Labs Reviewed  BASIC METABOLIC PANEL - Abnormal; Notable for the following components:      Result Value   Glucose, Bld 169 (*)    All other components within normal limits  CBC - Abnormal; Notable for the following components:   RBC 3.81 (*)    Hemoglobin 10.5 (*)    HCT 33.8 (*)    All other components within normal limits  URINALYSIS, ROUTINE W  REFLEX MICROSCOPIC - Abnormal; Notable for the following components:   Color, Urine YELLOW (*)    APPearance CLEAR (*)    Glucose, UA 50 (*)    Hgb urine dipstick SMALL (*)    All other components within normal limits  RESP PANEL BY RT-PCR (FLU A&B, COVID) [id number]  TROPONIN I (HIGH SENSITIVITY)  TROPONIN I (HIGH SENSITIVITY)    EKG Sinus rhythm, rate of 94 bpm.  Normal axis.  Right bundle.  No STEMI.  Tremulous baseline makes interpretation difficult.  RADIOLOGY CXR reviewed by me without evidence of acute cardiopulmonary pathology. CT abdomen/pelvis reviewed by me, lesion to the right kidney noted.  Official radiology report(s): CT ABDOMEN PELVIS W CONTRAST  Result Date: 05/29/2021 CLINICAL DATA:  hx colonic resection. lower abd pain and hematochezia. diverticulitis? EXAM: CT ABDOMEN AND PELVIS WITH CONTRAST TECHNIQUE: Multidetector CT  imaging of the abdomen and pelvis was performed using the standard protocol following bolus administration of intravenous contrast. RADIATION DOSE REDUCTION: This exam was performed according to the departmental dose-optimization program which includes automated exposure control, adjustment of the mA and/or kV according to patient size and/or use of iterative reconstruction technique. CONTRAST:  57mL OMNIPAQUE IOHEXOL 300 MG/ML  SOLN COMPARISON:  None. FINDINGS: Lower chest: No acute abnormality. Mild aortic valve leaflet, mild mitral annular, coronary artery calcifications. Hepatobiliary: No focal liver abnormality. No gallstones, gallbladder wall thickening, or pericholecystic fluid. No biliary dilatation. Pancreas: Diffusely atrophic. No focal lesion. Otherwise normal pancreatic contour. No surrounding inflammatory changes. No main pancreatic ductal dilatation. Spleen: Cm hypodensity within the spleen too small to characterize. Otherwise normal in size without focal abnormality. Adrenals/Urinary Tract: No adrenal nodule bilaterally. Bilateral kidneys enhance symmetrically. There is a 2.2 x 1.6 cm hypodense lesion within the right kidney with a density of 67 Hounsfield units. Subcentimeter hypodensities too small to characterize. No hydronephrosis. No hydroureter. The urinary bladder is unremarkable. Stomach/Bowel: Partial colectomy. Stomach is within normal limits. No evidence of bowel wall thickening or dilatation. The appendix is not definitely identified with no inflammatory changes in the right lower quadrant to suggest acute appendicitis. Vascular/Lymphatic: No abdominal aorta or iliac aneurysm. Severe atherosclerotic plaque of the aorta and its branches. No abdominal, pelvic, or inguinal lymphadenopathy. Reproductive: Uterus and bilateral adnexa are unremarkable. Other: Nonspecific swirling of the mesentery within the right lower lobe (5: 29-47). No intraperitoneal free fluid. No intraperitoneal free gas.  No organized fluid collection. Slight laxity of the pelvic floor with no definite hernia (2:70). Musculoskeletal: Small fat containing umbilical hernia. No suspicious lytic or blastic osseous lesions. No acute displaced fracture. Old healed left transverse process fractures of the lumbar spine. IMPRESSION: 1. Colonic diverticulosis with no acute diverticulitis. 2. Indeterminate left 2.2 cm right renal lesion. Findings concerning for a malignancy. Recommend nonemergent MRI renal protocol for further evaluation. When the patient is clinically stable and able to follow directions and hold their breath (preferably as an outpatient) further evaluation with dedicated abdominal MRI should be considered. 3.  Aortic Atherosclerosis (ICD10-I70.0) - severe. 4. Small fat containing umbilical hernia. Electronically Signed   By: Iven Finn M.D.   On: 05/29/2021 01:09   DG Chest Portable 1 View  Result Date: 05/28/2021 CLINICAL DATA:  Weakness EXAM: PORTABLE CHEST 1 VIEW COMPARISON:  08/24/2012 FINDINGS: The heart size and mediastinal contours are within normal limits. Both lungs are clear. Old left-sided rib fractures. Aortic atherosclerosis. IMPRESSION: No active disease. Electronically Signed   By: Donavan Foil M.D.   On: 05/28/2021 20:06  PROCEDURES and INTERVENTIONS:  .1-3 Lead EKG Interpretation Performed by: Vladimir Crofts, MD Authorized by: Vladimir Crofts, MD     Interpretation: normal     ECG rate:  76   ECG rate assessment: normal     Rhythm: sinus rhythm     Ectopy: none     Conduction: normal    Medications  lactated ringers bolus 1,000 mL (0 mLs Intravenous Stopped 05/29/21 0156)  iohexol (OMNIPAQUE) 300 MG/ML solution 75 mL (75 mLs Intravenous Contrast Given 05/29/21 0032)     IMPRESSION / MDM / ASSESSMENT AND PLAN / ED COURSE  I reviewed the triage vital signs and the nursing notes.  Pleasant and quite functional 88 year old woman presents to the ED with dizziness associated with new  hematochezia, requiring medical observation admission.  Her vitals are stable and she looks clinically well.  No tachycardia or bleeding events in the ED.  Blood work with normocytic anemia at 10.5 without recent comparison.  She appears somewhat dry on examination and received a liter of IV fluids with improvement of her dizziness.  Metabolic panel is reassuring without renal dysfunction or elevated BUN to suggest hemolysis.  Urine is noninfectious.  CT obtained to evaluate for diverticulitis in the setting of her history of diverticulosis, but does not demonstrate any evidence of this.  I educated her of the lesion to her right kidney.  She has not had a colonoscopy recently considering she lives by herself and is symptomatic with dizziness, I believe observation would be reasonable.  We will consult with hospitalist.  Clinical Course as of 05/29/21 0201  Wed May 29, 2021  0200 Reassessed and discussed CT results.  We discussed lesion to her right kidney and need for an MRI.  We discussed medical observation admission and she is agreeable.  We will consult with hospitalist.  Reports her dizziness is a little bit better. [DS]    Clinical Course User Index [DS] Vladimir Crofts, MD     FINAL CLINICAL IMPRESSION(S) / ED DIAGNOSES   Final diagnoses:  Dizziness  Hematochezia     Rx / DC Orders   ED Discharge Orders     None        Note:  This document was prepared using Dragon voice recognition software and may include unintentional dictation errors.   Vladimir Crofts, MD 05/29/21 [phone]

## 2021-05-28 NOTE — ED Triage Notes (Signed)
Pt reports that she has been having rectal bleeding since Saturday. Reports dark red blood and clots in her depends. Pt endorses feeling dizzy at this time.

## 2021-05-29 ENCOUNTER — Other Ambulatory Visit: Payer: Self-pay

## 2021-05-29 ENCOUNTER — Encounter: Payer: Self-pay | Admitting: Family Medicine

## 2021-05-29 ENCOUNTER — Observation Stay
Admit: 2021-05-29 | Discharge: 2021-05-29 | Disposition: A | Discharge status: Home / Self Care | Payer: Medicare Other | Attending: Internal Medicine | Admitting: Internal Medicine

## 2021-05-29 ENCOUNTER — Emergency Department: Payer: Medicare Other

## 2021-05-29 DIAGNOSIS — R42 Dizziness and giddiness: Secondary | ICD-10-CM | POA: Diagnosis not present

## 2021-05-29 DIAGNOSIS — I10 Essential (primary) hypertension: Secondary | ICD-10-CM | POA: Diagnosis not present

## 2021-05-29 DIAGNOSIS — K922 Gastrointestinal hemorrhage, unspecified: Secondary | ICD-10-CM | POA: Diagnosis not present

## 2021-05-29 DIAGNOSIS — K921 Melena: Secondary | ICD-10-CM | POA: Diagnosis not present

## 2021-05-29 LAB — URINALYSIS, ROUTINE W REFLEX MICROSCOPIC
Bacteria, UA: NONE SEEN
Bilirubin Urine: NEGATIVE
Glucose, UA: 50 mg/dL — AB
Ketones, ur: NEGATIVE mg/dL
Leukocytes,Ua: NEGATIVE
Nitrite: NEGATIVE
Protein, ur: NEGATIVE mg/dL
Specific Gravity, Urine: 1.015 (ref 1.005–1.030)
Squamous Epithelial / LPF: NONE SEEN (ref 0–5)
WBC, UA: NONE SEEN WBC/hpf (ref 0–5)
pH: 5 (ref 5.0–8.0)

## 2021-05-29 LAB — BASIC METABOLIC PANEL
Anion gap: 5 (ref 5–15)
BUN: 10 mg/dL (ref 8–23)
CO2: 28 mmol/L (ref 22–32)
Calcium: 9.4 mg/dL (ref 8.9–10.3)
Chloride: 107 mmol/L (ref 98–111)
Creatinine, Ser: 0.53 mg/dL (ref 0.44–1.00)
GFR, Estimated: >60 mL/min (ref >60)
Glucose, Bld: 144 mg/dL — ABNORMAL HIGH (ref 70–99)
Potassium: 4 mmol/L (ref 3.5–5.1)
Sodium: 140 mmol/L (ref 135–145)

## 2021-05-29 LAB — CBC
HCT: 33.4 % — ABNORMAL LOW (ref 36.0–46.0)
Hemoglobin: 10.8 g/dL — ABNORMAL LOW (ref 12.0–15.0)
MCH: 28.2 pg (ref 26.0–34.0)
MCHC: 32.3 g/dL (ref 30.0–36.0)
MCV: 87.2 fL (ref 80.0–100.0)
Platelets: 241 10*3/uL (ref 150–400)
RBC: 3.83 MIL/uL — ABNORMAL LOW (ref 3.87–5.11)
RDW: 13.6 % (ref 11.5–15.5)
WBC: 5.3 10*3/uL (ref 4.0–10.5)
nRBC: 0 % (ref 0.0–0.2)

## 2021-05-29 LAB — TROPONIN I (HIGH SENSITIVITY): Troponin I (High Sensitivity): 11 ng/L (ref <18)

## 2021-05-29 LAB — HEMOGLOBIN AND HEMATOCRIT, BLOOD
HCT: 33.3 % — ABNORMAL LOW (ref 36.0–46.0)
HCT: 32 % — ABNORMAL LOW (ref 36.0–46.0)
HCT: 30.2 % — ABNORMAL LOW (ref 36.0–46.0)
Hemoglobin: 10.4 g/dL — ABNORMAL LOW (ref 12.0–15.0)
Hemoglobin: 10.4 g/dL — ABNORMAL LOW (ref 12.0–15.0)
Hemoglobin: 9.6 g/dL — ABNORMAL LOW (ref 12.0–15.0)

## 2021-05-29 LAB — HEMOGLOBIN A1C
Hgb A1c MFr Bld: 8.3 % — ABNORMAL HIGH (ref 4.8–5.6)
Mean Plasma Glucose: 191.51 mg/dL

## 2021-05-29 LAB — GLUCOSE, CAPILLARY
Glucose-Capillary: 88 mg/dL (ref 70–99)
Glucose-Capillary: 262 mg/dL — ABNORMAL HIGH (ref 70–99)

## 2021-05-29 MED ORDER — HYDRALAZINE HCL 20 MG/ML IJ SOLN: 10.0000 mg | INTRAMUSCULAR | Status: DC | PRN

## 2021-05-29 MED ORDER — INSULIN ASPART 100 UNIT/ML IJ SOLN
0.0000 [IU] | Freq: Three times a day (TID) | INTRAMUSCULAR | Status: DC
  Administered 2021-05-29: 20:00:00 5 [IU] via SUBCUTANEOUS
  Administered 2021-05-29: 2 [IU] via SUBCUTANEOUS
  Filled 2021-05-29 (×2): qty 1

## 2021-05-29 MED ORDER — ADULT MULTIVITAMIN W/MINERALS CH
1.0000 | ORAL_TABLET | Freq: Every day | ORAL | Status: DC
  Administered 2021-05-29 – 2021-05-30 (×2): 1 via ORAL
  Filled 2021-05-29 (×2): qty 1

## 2021-05-29 MED ORDER — ACETAMINOPHEN 325 MG PO TABS: 650.0000 mg | ORAL_TABLET | Freq: Four times a day (QID) | ORAL | Status: DC | PRN

## 2021-05-29 MED ORDER — MAGNESIUM HYDROXIDE 400 MG/5ML PO SUSP: 30.0000 mL | Freq: Every day | ORAL | Status: DC | PRN

## 2021-05-29 MED ORDER — IOHEXOL 300 MG/ML  SOLN
75.0000 mL | Freq: Once | INTRAMUSCULAR | Status: AC | PRN
  Administered 2021-05-29: 75 mL via INTRAVENOUS

## 2021-05-29 MED ORDER — ONDANSETRON HCL 4 MG/2ML IJ SOLN: 4.0000 mg | Freq: Four times a day (QID) | INTRAMUSCULAR | Status: DC | PRN

## 2021-05-29 MED ORDER — LISINOPRIL 20 MG PO TABS
40.0000 mg | ORAL_TABLET | Freq: Every day | ORAL | Status: DC
  Administered 2021-05-29 – 2021-05-30 (×2): 40 mg via ORAL
  Filled 2021-05-29: qty 2
  Filled 2021-05-29: qty 4

## 2021-05-29 MED ORDER — IPRATROPIUM-ALBUTEROL 0.5-2.5 (3) MG/3ML IN SOLN: 3.0000 mL | RESPIRATORY_TRACT | Status: DC | PRN

## 2021-05-29 MED ORDER — ONDANSETRON HCL 4 MG PO TABS: 4.0000 mg | ORAL_TABLET | Freq: Four times a day (QID) | ORAL | Status: DC | PRN

## 2021-05-29 MED ORDER — TRAMADOL HCL 50 MG PO TABS: 50.0000 mg | ORAL_TABLET | Freq: Three times a day (TID) | ORAL | Status: DC | PRN

## 2021-05-29 MED ORDER — ASCORBIC ACID 500 MG PO TABS
500.0000 mg | ORAL_TABLET | Freq: Every evening | ORAL | Status: DC
Start: 2021-05-29 — End: 2021-05-30
  Administered 2021-05-29: 17:00:00 500 mg via ORAL
  Filled 2021-05-29: qty 1

## 2021-05-29 MED ORDER — AMLODIPINE BESYLATE 10 MG PO TABS
10.0000 mg | ORAL_TABLET | Freq: Every day | ORAL | Status: DC
  Administered 2021-05-29: 10 mg via ORAL
  Filled 2021-05-29: qty 1

## 2021-05-29 MED ORDER — QUINAPRIL HCL 10 MG PO TABS: 40.0000 mg | ORAL_TABLET | Freq: Two times a day (BID) | ORAL | Status: DC

## 2021-05-29 MED ORDER — OYSTER SHELL CALCIUM/D3 500-5 MG-MCG PO TABS
1.0000 | ORAL_TABLET | Freq: Two times a day (BID) | ORAL | Status: DC
  Administered 2021-05-29 – 2021-05-30 (×2): 1 via ORAL
  Filled 2021-05-29 (×3): qty 1

## 2021-05-29 MED ORDER — GLIPIZIDE ER 5 MG PO TB24
5.0000 mg | ORAL_TABLET | Freq: Two times a day (BID) | ORAL | Status: DC
  Administered 2021-05-29 – 2021-05-30 (×2): 5 mg via ORAL
  Filled 2021-05-29 (×5): qty 1

## 2021-05-29 MED ORDER — METOPROLOL TARTRATE 5 MG/5ML IV SOLN: 5.0000 mg | INTRAVENOUS | Status: DC | PRN

## 2021-05-29 MED ORDER — PANTOPRAZOLE SODIUM 40 MG IV SOLR
40.0000 mg | INTRAVENOUS | Status: DC
  Administered 2021-05-29 (×2): 40 mg via INTRAVENOUS
  Filled 2021-05-29 (×2): qty 10

## 2021-05-29 MED ORDER — LACTATED RINGERS IV BOLUS
1000.0000 mL | Freq: Once | INTRAVENOUS | Status: AC
  Administered 2021-05-29: 1000 mL via INTRAVENOUS

## 2021-05-29 MED ORDER — ATORVASTATIN CALCIUM 20 MG PO TABS
20.0000 mg | ORAL_TABLET | Freq: Every day | ORAL | Status: DC
  Administered 2021-05-29: 20 mg via ORAL
  Filled 2021-05-29: qty 1

## 2021-05-29 MED ORDER — SODIUM CHLORIDE 0.9 % IV SOLN: INTRAVENOUS | Status: DC

## 2021-05-29 MED ORDER — TRAZODONE HCL 50 MG PO TABS
25.0000 mg | ORAL_TABLET | Freq: Every evening | ORAL | Status: DC | PRN
  Administered 2021-05-29: 20:00:00 25 mg via ORAL
  Filled 2021-05-29: qty 1

## 2021-05-29 MED ORDER — ACETAMINOPHEN 650 MG RE SUPP: 650.0000 mg | Freq: Four times a day (QID) | RECTAL | Status: DC | PRN

## 2021-05-29 MED ORDER — SENNOSIDES-DOCUSATE SODIUM 8.6-50 MG PO TABS: 1.0000 | ORAL_TABLET | Freq: Every evening | ORAL | Status: DC | PRN

## 2021-05-29 MED ORDER — GUAIFENESIN 100 MG/5ML PO LIQD: 5.0000 mL | ORAL | Status: DC | PRN

## 2021-05-29 NOTE — Evaluation (Signed)
Physical Therapy Evaluation Patient Details Name: Susan [name] MRN: [id number] DOB: 1926-06-28 Today's Date: 05/29/2021  History of Present Illness  Susan [name] is a 88 y.o. Caucasian female with medical history significant for type 2 diabetes mellitus, dyslipidemia, hypertension, CVA and TIA, diverticulosis and anemia, who presented to the ER with acute onset of recurrent hematochezia over the last few days.   Clinical Impression  Patient received in bed, she is agreeable to PT assessment. Patient is independent with bed mobility. Transfers bed to wheelchair and back to bed with supervision/ line management. Patient self propelled wheelchair 150 feet independently. She appears to be at baseline level of function. She may benefit from HHPT to assess mobility/safety at home. No further skilled acute needs at this time.         Recommendations for follow up therapy are one component of a multi-disciplinary discharge planning process, led by the attending physician.  Recommendations may be updated based on patient status, additional functional criteria and insurance authorization.  Follow Up Recommendations Home health PT    Assistance Recommended at Discharge Intermittent Supervision/Assistance  Patient can return home with the following  Assist for transportation    Equipment Recommendations None recommended by PT  Recommendations for Other Services       Functional Status Assessment Patient has not had a recent decline in their functional status     Precautions / Restrictions Precautions Precautions: Fall Restrictions Weight Bearing Restrictions: No      Mobility  Bed Mobility Overal bed mobility: Independent                  Transfers Overall transfer level: Modified independent Equipment used: None               General transfer comment: assist for line mgmt only    Ambulation/Gait               General Gait Details: patient is not ambulatory at  baseline  Administrator mobility: Yes Wheelchair propulsion: Both upper extremities, Both lower extermities Wheelchair parts: Supervision/cueing Distance: 150 feet Wheelchair Assistance Details (indicate cue type and reason): cues needed to lock wheelchair prior to getting up to return to bed.  Modified Rankin (Stroke Patients Only)       Balance Overall balance assessment: Mild deficits observed, not formally tested                                           Pertinent Vitals/Pain Pain Assessment Pain Assessment: No/denies pain    Home Living Family/patient expects to be discharged to:: Private residence Living Arrangements: Alone Available Help at Discharge: Family;Available PRN/intermittently Type of Home: Apartment Home Access: Elevator       Home Layout: One level Home Equipment: BSC/3in1;Wheelchair - manual;Shower seat Additional Comments: reports family visits ~every other day.    Prior Function Prior Level of Function : Independent/Modified Independent             Mobility Comments: MOD I using w/c - family assists transportation ADLs Comments: MOD I in sitting, family assists IADLs     Hand Dominance        Extremity/Trunk Assessment   Upper Extremity Assessment Upper Extremity Assessment: Defer to OT evaluation    Lower Extremity Assessment Lower Extremity Assessment: Overall [facility]  for tasks assessed    Cervical / Trunk Assessment Cervical / Trunk Assessment: Normal  Communication   Communication: HOH  Cognition Arousal/Alertness: Awake/alert Behavior During Therapy: WFL for tasks assessed/performed Overall Cognitive Status: Within Functional Limits for tasks assessed                                          General Comments      Exercises     Assessment/Plan    PT Assessment Patient does not need any further PT services  PT Problem  List         PT Treatment Interventions      PT Goals (Current goals can be found in the Care Plan section)  Acute Rehab PT Goals Patient Stated Goal: to return to her apartment PT Goal Formulation: With patient Time For Goal Achievement: 06/01/21 Potential to Achieve Goals: Good    Frequency       Co-evaluation               AM-PAC PT "6 Clicks" Mobility  Outcome Measure Help needed turning from your back to your side while in a flat bed without using bedrails?: None Help needed moving from lying on your back to sitting on the side of a flat bed without using bedrails?: None Help needed moving to and from a bed to a chair (including a wheelchair)?: None Help needed standing up from a chair using your arms (e.g., wheelchair or bedside chair)?: None Help needed to walk in hospital room?: Total Help needed climbing 3-5 steps with a railing? : Total 6 Click Score: 18    End of Session   Activity Tolerance: Patient tolerated treatment well Patient left: in bed;with call bell/phone within reach;with bed alarm set Nurse Communication: Mobility status      Time: 1500-1520 PT Time Calculation (min) (ACUTE ONLY): 20 min   Charges:   PT Evaluation $PT Eval Low Complexity: 1 Low PT Treatments $Wheel Chair Management: 8-22 mins        Elynn Patteson, PT, GCS 05/29/21,3:39 PM

## 2021-05-29 NOTE — Progress Notes (Signed)
Patient just arrived to unit 

## 2021-05-29 NOTE — Consult Note (Signed)
Susan [name] , MD [address], [place], [place], [address] [address], [place], [place], [address] Phone: [phone]  Fax: [phone]  Consultation  Referring Provider:   Dr Nelson Chimes Primary Care Physician:  [name], MD Primary Gastroenterologist:  None          Reason for Consultation:     Anemia  Date of Admission:  05/28/2021 Date of Consultation:  05/29/2021         HPI:   Susan [name] is a 88 y.o. female presented to the emergency room with hematochezia.  Wheelchair-bound had an abdominal CT which showed 2.2 cm right renal lesion colonic diverticulosis with no diverticulitis.  On admission hemoglobin was 10.5 g 3 years back hemoglobin was 11.8 g MCV of 87.  Hemoglobin has been stable since admission.  No elevated BUN to creatinine ratio.  She says that she had a diverticular bleed on a few occasions in the past and had  part of her colon resected over 8 years back and this is the first episode since that she has had bright red blood per rectum which started on Sunday , resolved then returned on Monday and since stopped. Denies any abdominal pain, vomiting or NSAID use. No complaints presently. Says she has had a colonoscopy around the time of her surgery 8 years back.   Past Medical History:  Diagnosis Date   Anemia    Balance problem    Diabetes mellitus without complication (HCC)    Diverticulosis    Heart murmur    Hypercholesteremia    Hypertension    Stroke [facility])    TIA 2006   TIA (transient ischemic attack) 2006    Past Surgical History:  Procedure Laterality Date   CATARACT EXTRACTION W/PHACO Left 05/22/2015   Procedure: CATARACT EXTRACTION PHACO AND INTRAOCULAR LENS PLACEMENT (IOC);  Surgeon: Galen Manila, MD;  Location: [facility] ORS;  Service: Ophthalmology;  Laterality: Left;  [place]: 01:07.9 AP%: 23.0 CDE: 15.62 Lot# [id number]   CATARACT EXTRACTION W/PHACO Right 06/12/2015   Procedure: CATARACT EXTRACTION PHACO AND INTRAOCULAR LENS  PLACEMENT (IOC);  Surgeon: Galen Manila, MD;  Location: [facility] ORS;  Service: Ophthalmology;  Laterality: Right;  [place] 00:57 AP% 17.1 CDE 9.86 fluid pack lot # [id number]   COLON SURGERY     resection   ECTOPIC PREGNANCY SURGERY     HERNIA REPAIR     TONSILLECTOMY      Prior to Admission medications   Medication Sig Start Date End Date Taking? Authorizing Provider  amLODipine (NORVASC) 10 MG tablet Take 10 mg by mouth daily before supper.   Yes [provider]  ascorbic acid (VITAMIN C) 500 MG tablet Take 500 mg by mouth every evening.   Yes [provider]  aspirin EC 81 MG tablet Take 81 mg by mouth daily. Swallow whole.   Yes [provider]  atorvastatin (LIPITOR) 20 MG tablet Take 20 mg by mouth daily after supper.   Yes [provider]  Calcium Carbonate-Vitamin D 600-400 MG-UNIT tablet Take 1 tablet by mouth 2 (two) times daily.   Yes [provider]  glipiZIDE (GLUCOTROL XL) 5 MG 24 hr tablet Take 5 mg by mouth 2 (two) times daily.    Yes [provider]  hydrochlorothiazide (MICROZIDE) 12.5 MG capsule Take 12.5 mg by mouth daily. 04/11/21  Yes [provider]  latanoprost (XALATAN) 0.005 % ophthalmic solution 1 drop at bedtime. 03/17/21  Yes [provider]  lisinopril (  ZESTRIL) 40 MG tablet Take 40 mg by mouth daily. 04/11/21  Yes [provider]  metFORMIN (GLUCOPHAGE) 500 MG tablet Take 500 mg by mouth 2 (two) times daily with a meal.    Yes [provider]  Multiple Vitamin (MULTIVITAMIN WITH MINERALS) TABS tablet Take 1 tablet by mouth daily.   Yes [provider]  quinapril (ACCUPRIL) 40 MG tablet Take 40 mg by mouth 2 (two) times daily. Breakfast and dinner.  Patient not taking: Reported on 05/29/2021    [provider]    No family history on file.   Social History   Tobacco Use   Smoking status: Former   Smokeless tobacco: Former  Building services engineer Use: Never  used  Substance Use Topics   Alcohol use: No    Allergies as of 05/28/2021 - Review Complete 05/28/2021  Allergen Reaction Noted   Atenolol Other (See Comments) 05/18/2015   Naprosyn [naproxen] Other (See Comments) 05/18/2015    Review of Systems:    All systems reviewed and negative except where noted in HPI.   Physical Exam:  Vital signs in last 24 hours: Temp:  [98.7 F (37.1 C)] 98.7 F (37.1 C) (02/15 0045) Pulse Rate:  [59-96] 80 (02/15 0935) Resp:  [14-20] 20 (02/15 0935) BP: (152-187)/(65-89) 178/89 (02/15 0935) SpO2:  [98 %-100 %] 100 % (02/15 0935)   General:   Pleasant, cooperative in NAD Head:  Normocephalic and atraumatic. Eyes:   No icterus.   Conjunctiva pink. PERRLA. Ears:  Normal auditory acuity. Neck:  Supple; no masses or thyroidomegaly Lungs: Respirations even and unlabored. Lungs clear to auscultation bilaterally.   No wheezes, crackles, or rhonchi.  Heart:  Regular rate and rhythm;  Without murmur, clicks, rubs or gallops Abdomen:  Soft, nondistended, nontender. Normal bowel sounds. No appreciable masses or hepatomegaly.  No rebound or guarding.  Neurologic:  Alert and oriented x3;  grossly normal neurologically. Psych:  Alert and cooperative. Normal affect.  LAB RESULTS: Recent Labs    05/28/21 1932 05/29/21 0253 05/29/21 0639  WBC 7.2  --  5.3  HGB 10.5* 10.4* 10.8*  HCT 33.8* 33.3* 33.4*  PLT 255  --  241   BMET Recent Labs    05/28/21 1932 05/29/21 0639  NA 138 140  K 4.1 4.0  CL 105 107  CO2 27 28  GLUCOSE 169* 144*  BUN 18 10  CREATININE 0.79 0.53  CALCIUM 9.7 9.4   LFT No results for input(s): PROT, ALBUMIN, AST, ALT, ALKPHOS, BILITOT, BILIDIR, IBILI in the last 72 hours. PT/INR No results for input(s): LABPROT, INR in the last 72 hours.  STUDIES: CT ABDOMEN PELVIS W CONTRAST  Result Date: 05/29/2021 CLINICAL DATA:  hx colonic resection. lower abd pain and hematochezia. diverticulitis? EXAM: CT ABDOMEN AND PELVIS WITH  CONTRAST TECHNIQUE: Multidetector CT imaging of the abdomen and pelvis was performed using the standard protocol following bolus administration of intravenous contrast. RADIATION DOSE REDUCTION: This exam was performed according to the departmental dose-optimization program which includes automated exposure control, adjustment of the mA and/or kV according to patient size and/or use of iterative reconstruction technique. CONTRAST:  45mL OMNIPAQUE IOHEXOL 300 MG/ML  SOLN COMPARISON:  None. FINDINGS: Lower chest: No acute abnormality. Mild aortic valve leaflet, mild mitral annular, coronary artery calcifications. Hepatobiliary: No focal liver abnormality. No gallstones, gallbladder wall thickening, or pericholecystic fluid. No biliary dilatation. Pancreas: Diffusely atrophic. No focal lesion. Otherwise normal pancreatic contour. No surrounding inflammatory changes. No main pancreatic  ductal dilatation. Spleen: Cm hypodensity within the spleen too small to characterize. Otherwise normal in size without focal abnormality. Adrenals/Urinary Tract: No adrenal nodule bilaterally. Bilateral kidneys enhance symmetrically. There is a 2.2 x 1.6 cm hypodense lesion within the right kidney with a density of 67 Hounsfield units. Subcentimeter hypodensities too small to characterize. No hydronephrosis. No hydroureter. The urinary bladder is unremarkable. Stomach/Bowel: Partial colectomy. Stomach is within normal limits. No evidence of bowel wall thickening or dilatation. The appendix is not definitely identified with no inflammatory changes in the right lower quadrant to suggest acute appendicitis. Vascular/Lymphatic: No abdominal aorta or iliac aneurysm. Severe atherosclerotic plaque of the aorta and its branches. No abdominal, pelvic, or inguinal lymphadenopathy. Reproductive: Uterus and bilateral adnexa are unremarkable. Other: Nonspecific swirling of the mesentery within the right lower lobe (5: 29-47). No intraperitoneal free  fluid. No intraperitoneal free gas. No organized fluid collection. Slight laxity of the pelvic floor with no definite hernia (2:70). Musculoskeletal: Small fat containing umbilical hernia. No suspicious lytic or blastic osseous lesions. No acute displaced fracture. Old healed left transverse process fractures of the lumbar spine. IMPRESSION: 1. Colonic diverticulosis with no acute diverticulitis. 2. Indeterminate left 2.2 cm right renal lesion. Findings concerning for a malignancy. Recommend nonemergent MRI renal protocol for further evaluation. When the patient is clinically stable and able to follow directions and hold their breath (preferably as an outpatient) further evaluation with dedicated abdominal MRI should be considered. 3.  Aortic Atherosclerosis (ICD10-I70.0) - severe. 4. Small fat containing umbilical hernia. Electronically Signed   By: Tish Frederickson M.D.   On: 05/29/2021 01:09   DG Chest Portable 1 View  Result Date: 05/28/2021 CLINICAL DATA:  Weakness EXAM: PORTABLE CHEST 1 VIEW COMPARISON:  08/24/2012 FINDINGS: The heart size and mediastinal contours are within normal limits. Both lungs are clear. Old left-sided rib fractures. Aortic atherosclerosis. IMPRESSION: No active disease. Electronically Signed   By: Jasmine Pang M.D.   On: 05/28/2021 20:06      Impression / Plan:   MCKENLEIGH TARLTON is a 88 y.o. y/o female with a prior history of  a diverticular bleed, s/p part of her colon resected 8 years back, admitted with 1-2 days of intermittent rectal bleeding. Possible diverticular bleed vs bleeding from polyps vs neoplasm on differentials. Discussed about a colonoscopy which she is not interested. Hence suggest monitor CBC, transfuse as needd, if has new bleed get CT angiogram or tagged RBC scan and we can readdress a colonoscopy with her at that time. She is aware that any underlying lesion may not be diagnosed by not doing a colonoscopy    I will sign off.  Please call me if any further  GI concerns or questions.  We would like to thank you for the opportunity to participate in the care of Susan [name].   Thank you for involving me in the care of this patient.      LOS: 0 days   Susan Mood, MD  05/29/2021, 1:01 PM

## 2021-05-29 NOTE — Progress Notes (Signed)
*  PRELIMINARY RESULTS* Echocardiogram 2D Echocardiogram has been performed.  Susan [name] 05/29/2021, 2:44 PM

## 2021-05-29 NOTE — H&P (Addendum)
[place]   PATIENT NAME: Susan [name]    MR#:  [id number]  DATE OF BIRTH:  [birth year]  DATE OF ADMISSION:  05/28/2021  PRIMARY CARE PHYSICIAN: Denton Lank, MD   Patient is coming from: Home  REQUESTING/REFERRING PHYSICIAN: Vladimir Crofts, MD  CHIEF COMPLAINT:   Chief Complaint  Patient presents with   Dizziness   Weakness   Rectal Bleeding    HISTORY OF PRESENT ILLNESS:  Susan [name] is a 88 y.o. Caucasian female with medical history significant for type 2 diabetes mellitus, dyslipidemia, hypertension, CVA and TIA, diverticulosis and anemia, who presented to the ER with acute onset of recurrent hematochezia over the last few days.  She had dizziness and generalized weakness today with her rectal bleeding.  She lives at home by herself.  She is typically wheelchair-bound but is able to handle her ADLs.  She been having mild lower abdominal discomfort more in the right on Saturday.  She then had a bowel movement with bright red blood.  She also noted occasional dark black stools.  Bleeding happened on Saturday and it slowed down to almost nothing on Monday and Tuesday with only occasional smears.  It later recurred today with black stools and associated dizziness. No dysuria, oliguria or hematuria or flank pain.  No cough or wheezing hemoptysis.  No other bleeding diathesis.  ED Course: When she came to the ER, BP was 186/63 with otherwise normal vital signs.  Labs revealed blood glucose of 169 with otherwise unremarkable BMP.  CBC showed anemia with hemoglobin of 10.5 and hematocrit 33.8 compared to 11.8 and 37.6 on 3/23 2019.  Repeat H&H were 10.4 and 33.3.  Influenza antigens and COVID-19 PCR came back negative.  UA was unremarkable.  Abdominal and pelvic CT scan revealed following EKG as reviewed by me : EKG shows normal sinus rhythm with rate of 94 with premature supraventricular complexes and right bundle branch block. Imaging: Abdominal pelvic CT scan revealed the  following: 1. Colonic diverticulosis with no acute diverticulitis. 2. Indeterminate left 2.2 cm right renal lesion. Findings concerning for a malignancy. Recommend nonemergent MRI renal protocol for further evaluation. When the patient is clinically stable and able to follow directions and hold their breath (preferably as an outpatient) further evaluation with dedicated abdominal MRI should be considered. 3.  Severe aortic Atherosclerosis. 4. Small fat containing umbilical hernia.  The patient was typed and crossmatched.  She was given 1 L bolus of IV lactated Ringer.  She will be admitted to an observation medical telemetry bed for further evaluation and management.  PAST MEDICAL HISTORY:   Past Medical History:  Diagnosis Date   Anemia    Balance problem    Diabetes mellitus without complication ([place])    Diverticulosis    Heart murmur    Hypercholesteremia    Hypertension    Stroke [facility])    TIA 2006   TIA (transient ischemic attack) 2006    PAST SURGICAL HISTORY:   Past Surgical History:  Procedure Laterality Date   CATARACT EXTRACTION W/PHACO Left 05/22/2015   Procedure: CATARACT EXTRACTION PHACO AND INTRAOCULAR LENS PLACEMENT ([place]);  Surgeon: Birder Robson, MD;  Location: [facility] ORS;  Service: Ophthalmology;  Laterality: Left;  [place]: 01:07.9 AP%: 23.0 CDE: 15.62 Lot# [id number]   CATARACT EXTRACTION W/PHACO Right 06/12/2015   Procedure: CATARACT EXTRACTION PHACO AND INTRAOCULAR LENS PLACEMENT (IOC);  Surgeon: Birder Robson, MD;  Location: [facility] ORS;  Service: Ophthalmology;  Laterality: Right;  [place] 00:57 AP%  17.1 CDE 9.86 fluid pack lot # [id number]   COLON SURGERY     resection   ECTOPIC PREGNANCY SURGERY     HERNIA REPAIR     TONSILLECTOMY      SOCIAL HISTORY:   Social History   Tobacco Use   Smoking status: Former   Smokeless tobacco: Former  Substance Use Topics   Alcohol use: No    FAMILY HISTORY:  Positive for diabetes mellitus, and end-stage renal  disease  DRUG ALLERGIES:   Allergies  Allergen Reactions   Atenolol Other (See Comments)    "affected my heart rate"   Naprosyn [Naproxen] Other (See Comments)    GI Bleed    REVIEW OF SYSTEMS:   ROS As per history of present illness. All pertinent systems were reviewed above. Constitutional, HEENT, cardiovascular, respiratory, GI, GU, musculoskeletal, neuro, psychiatric, endocrine, integumentary and hematologic systems were reviewed and are otherwise negative/unremarkable except for positive findings mentioned above in the HPI.   MEDICATIONS AT HOME:   Prior to Admission medications   Medication Sig Start Date End Date Taking? Authorizing Provider  amLODipine (NORVASC) 10 MG tablet Take 10 mg by mouth daily before supper.   Yes [provider]  ascorbic acid (VITAMIN C) 500 MG tablet Take 500 mg by mouth every evening.   Yes [provider]  aspirin EC 81 MG tablet Take 81 mg by mouth daily. Swallow whole.   Yes [provider]  atorvastatin (LIPITOR) 20 MG tablet Take 20 mg by mouth daily after supper.   Yes [provider]  Calcium Carbonate-Vitamin D 600-400 MG-UNIT tablet Take 1 tablet by mouth 2 (two) times daily.   Yes [provider]  glipiZIDE (GLUCOTROL XL) 5 MG 24 hr tablet Take 5 mg by mouth 2 (two) times daily.    Yes [provider]  hydrochlorothiazide (MICROZIDE) 12.5 MG capsule Take 12.5 mg by mouth daily. 04/11/21  Yes [provider]  latanoprost (XALATAN) 0.005 % ophthalmic solution 1 drop at bedtime. 03/17/21  Yes [provider]  lisinopril (ZESTRIL) 40 MG tablet Take 40 mg by mouth daily. 04/11/21  Yes [provider]  metFORMIN (GLUCOPHAGE) 500 MG tablet Take 500 mg by mouth 2 (two) times daily with a meal.    Yes [provider]  Multiple Vitamin (MULTIVITAMIN WITH MINERALS) TABS tablet Take 1 tablet by mouth daily.   Yes [provider]  quinapril (ACCUPRIL) 40  MG tablet Take 40 mg by mouth 2 (two) times daily. Breakfast and dinner.  Patient not taking: Reported on 05/29/2021    [provider]      VITAL SIGNS:  Blood pressure (!) 166/85, pulse 83, temperature 98.7 F (37.1 C), temperature source Oral, resp. rate 15, SpO2 100 %.  PHYSICAL EXAMINATION:  Physical Exam  GENERAL:  88 y.o.-year-old African-American female patient lying in the bed with no acute distress.  EYES: Pupils equal, round, reactive to light and accommodation. No scleral icterus. Extraocular muscles intact.  HEENT: Head atraumatic, normocephalic. Oropharynx and nasopharynx clear.  NECK:  Supple, no jugular venous distention. No thyroid enlargement, no tenderness.  LUNGS: Normal breath sounds bilaterally, no wheezing, rales,rhonchi or crepitation. No use of accessory muscles of respiration.  CARDIOVASCULAR: Regular rate and rhythm, S1, S2 normal. No murmurs, rubs, or gallops.  ABDOMEN: Soft, nondistended, nontender. Bowel sounds present. No organomegaly or mass.  EXTREMITIES: No pedal edema, cyanosis, or clubbing.  NEUROLOGIC: Cranial nerves II through XII are intact. Muscle strength 5/5 in all  extremities. Sensation intact. Gait not checked.  PSYCHIATRIC: The patient is alert and oriented x 3.  Normal affect and good eye contact. SKIN: No obvious rash, lesion, or ulcer.   LABORATORY PANEL:   CBC Recent Labs  Lab 05/28/21 1932 05/29/21 0253  WBC 7.2  --   HGB 10.5* 10.4*  HCT 33.8* 33.3*  PLT 255  --    ------------------------------------------------------------------------------------------------------------------  Chemistries  Recent Labs  Lab 05/28/21 1932  NA 138  K 4.1  CL 105  CO2 27  GLUCOSE 169*  BUN 18  CREATININE 0.79  CALCIUM 9.7   ------------------------------------------------------------------------------------------------------------------  Cardiac Enzymes No results for input(s): TROPONINI in the last 168  hours. ------------------------------------------------------------------------------------------------------------------  RADIOLOGY:  CT ABDOMEN PELVIS W CONTRAST  Result Date: 05/29/2021 CLINICAL DATA:  hx colonic resection. lower abd pain and hematochezia. diverticulitis? EXAM: CT ABDOMEN AND PELVIS WITH CONTRAST TECHNIQUE: Multidetector CT imaging of the abdomen and pelvis was performed using the standard protocol following bolus administration of intravenous contrast. RADIATION DOSE REDUCTION: This exam was performed according to the departmental dose-optimization program which includes automated exposure control, adjustment of the mA and/or kV according to patient size and/or use of iterative reconstruction technique. CONTRAST:  7mL OMNIPAQUE IOHEXOL 300 MG/ML  SOLN COMPARISON:  None. FINDINGS: Lower chest: No acute abnormality. Mild aortic valve leaflet, mild mitral annular, coronary artery calcifications. Hepatobiliary: No focal liver abnormality. No gallstones, gallbladder wall thickening, or pericholecystic fluid. No biliary dilatation. Pancreas: Diffusely atrophic. No focal lesion. Otherwise normal pancreatic contour. No surrounding inflammatory changes. No main pancreatic ductal dilatation. Spleen: Cm hypodensity within the spleen too small to characterize. Otherwise normal in size without focal abnormality. Adrenals/Urinary Tract: No adrenal nodule bilaterally. Bilateral kidneys enhance symmetrically. There is a 2.2 x 1.6 cm hypodense lesion within the right kidney with a density of 67 Hounsfield units. Subcentimeter hypodensities too small to characterize. No hydronephrosis. No hydroureter. The urinary bladder is unremarkable. Stomach/Bowel: Partial colectomy. Stomach is within normal limits. No evidence of bowel wall thickening or dilatation. The appendix is not definitely identified with no inflammatory changes in the right lower quadrant to suggest acute appendicitis. Vascular/Lymphatic: No  abdominal aorta or iliac aneurysm. Severe atherosclerotic plaque of the aorta and its branches. No abdominal, pelvic, or inguinal lymphadenopathy. Reproductive: Uterus and bilateral adnexa are unremarkable. Other: Nonspecific swirling of the mesentery within the right lower lobe (5: 29-47). No intraperitoneal free fluid. No intraperitoneal free gas. No organized fluid collection. Slight laxity of the pelvic floor with no definite hernia (2:70). Musculoskeletal: Small fat containing umbilical hernia. No suspicious lytic or blastic osseous lesions. No acute displaced fracture. Old healed left transverse process fractures of the lumbar spine. IMPRESSION: 1. Colonic diverticulosis with no acute diverticulitis. 2. Indeterminate left 2.2 cm right renal lesion. Findings concerning for a malignancy. Recommend nonemergent MRI renal protocol for further evaluation. When the patient is clinically stable and able to follow directions and hold their breath (preferably as an outpatient) further evaluation with dedicated abdominal MRI should be considered. 3.  Aortic Atherosclerosis (ICD10-I70.0) - severe. 4. Small fat containing umbilical hernia. Electronically Signed   By: Iven Finn M.D.   On: 05/29/2021 01:09   DG Chest Portable 1 View  Result Date: 05/28/2021 CLINICAL DATA:  Weakness EXAM: PORTABLE CHEST 1 VIEW COMPARISON:  08/24/2012 FINDINGS: The heart size and mediastinal contours are within normal limits. Both lungs are clear. Old left-sided rib fractures. Aortic atherosclerosis. IMPRESSION: No active disease. Electronically Signed   By: Madie Reno.D.  On: 05/28/2021 20:06      IMPRESSION AND PLAN:  Principal Problem:   GI bleeding 1.  GI bleeding with recurrent hematochezia and postural dizziness. - The patient be admitted to the patient medical telemetry bed. - We will follow serial H&H. - GI consult will be obtained. Patient notified Dr. Nickolas Madrid about the patient. - The patient will be placed on  IV Protonix daily.  2.  Essential hypertension. - We will continue her antihypertensives.  3.  Dyslipidemia. - We will continue statin therapy.  4.  Type 2 diabetes mellitus. - The patient will be placed on supplement coverage with NovoLog will continue glipizide. - We will continue Tradjenta and hold off metformin.  DVT prophylaxis: Lovenox. Advanced Care Planning:  Code Status: She is DNR/DNI.  This was discussed with her.  Family Communication:  The plan of care was discussed in details with the patient (and family). I answered all questions. The patient agreed to proceed with the above mentioned plan. Further management will depend upon hospital course. Disposition Plan: Back to previous home environment Consults called: GI consult All the records are reviewed and case discussed with ED provider.  Status is: Observation  I certify that at the time of admission, it is my clinical judgment that the patient will require inpatient hospital care extending less than 2 midnights.                            Dispo: The patient is from: Home              Anticipated d/c is to: Home              Patient currently is not medically stable to d/c.              Difficult to place patient: No   Christel Mormon M.D on 05/29/2021 at 3:44 AM  [facility]   From 7 PM-7 AM, contact night-coverage [email or web address]  CC: Primary care physician; Denton Lank, MD

## 2021-05-29 NOTE — Evaluation (Signed)
Occupational Therapy Evaluation Patient Details Name: Susan [name] MRN: [id number] DOB: 1926-05-28 Today's Date: 05/29/2021   History of Present Illness Susan [name] is a 88 y.o. Caucasian female with medical history significant for type 2 diabetes mellitus, dyslipidemia, hypertension, CVA and TIA, diverticulosis and anemia, who presented to the ER with acute onset of recurrent hematochezia over the last few days.   Clinical Impression   Susan [name] was seen for OT evaluation this date. Prior to hospital admission, pt was MOD I for mobility and ADLs using w/c. Pt lives alone in senior living apartments with family available PRN. Pt presents to acute OT demonstrating near baseline functional mobility and ADL performance, completes all dressing at bed level with good ROM and strength to perform bridging. Bed<>w/c and bed<>recliner with SUPERVISION, cues for line mgmt only. Pt makes bed while seated in w/c. Upon hospital discharge, recommend HHOT.      Recommendations for follow up therapy are one component of a multi-disciplinary discharge planning process, led by the attending physician.  Recommendations may be updated based on patient status, additional functional criteria and insurance authorization.   Follow Up Recommendations  Home health OT    Assistance Recommended at Discharge Intermittent Supervision/Assistance  Patient can return home with the following Assistance with cooking/housework    Functional Status Assessment  Patient has not had a recent decline in their functional status  Equipment Recommendations  None recommended by OT    Recommendations for Other Services       Precautions / Restrictions Precautions Precautions: Fall Restrictions Weight Bearing Restrictions: No      Mobility Bed Mobility Overal bed mobility: Independent                  Transfers Overall transfer level: Modified independent                 General transfer comment: assist for  line mgmt only      Balance Overall balance assessment: Mild deficits observed, not formally tested                                         ADL either performed or assessed with clinical judgement   ADL Overall ADL's : Modified independent;At baseline                                       General ADL Comments: completes all dressing at bed level with good ROM and strength to perform bridging. Bed<>w/c and bed<>recliner with SUPERVISION, cues for line mgmt only. Pt makes bed while seated in w/c.      Pertinent Vitals/Pain Pain Assessment Pain Assessment: No/denies pain     Hand Dominance     Extremity/Trunk Assessment Upper Extremity Assessment Upper Extremity Assessment: Overall WFL for tasks assessed   Lower Extremity Assessment Lower Extremity Assessment: Overall WFL for tasks assessed       Communication Communication Communication: HOH   Cognition Arousal/Alertness: Awake/alert Behavior During Therapy: WFL for tasks assessed/performed Overall Cognitive Status: Within Functional Limits for tasks assessed                                        Home Living Family/patient expects to be  discharged to:: Private residence Living Arrangements: Alone Available Help at Discharge: Family;Available PRN/intermittently Type of Home: Apartment [employer] [place] senior apartments) Home Access: Elevator     Home Layout: One level     Bathroom Shower/Tub: Estate manager/land agent Accessibility: Yes How Accessible: Accessible via wheelchair Home Equipment: BSC/3in1;Wheelchair - manual;Shower seat   Additional Comments: reports family visits ~every other day.      Prior Functioning/Environment Prior Level of Function : Independent/Modified Independent             Mobility Comments: MOD I using w/c - family assists transportation ADLs Comments: MOD I in sitting, family assists IADLs        OT Problem List:  Impaired balance (sitting and/or standing);Decreased safety awareness         OT Goals(Current goals can be found in the care plan section) Acute Rehab OT Goals Patient Stated Goal: to go home OT Goal Formulation: With patient Time For Goal Achievement: 06/12/21 Potential to Achieve Goals: Good   AM-PAC OT "6 Clicks" Daily Activity     Outcome Measure Help from another person eating meals?: None Help from another person taking care of personal grooming?: None Help from another person toileting, which includes using toliet, bedpan, or urinal?: A Little Help from another person bathing (including washing, rinsing, drying)?: A Little Help from another person to put on and taking off regular upper body clothing?: None Help from another person to put on and taking off regular lower body clothing?: None 6 Click Score: 22   End of Session Nurse Communication: Mobility status  Activity Tolerance: Patient tolerated treatment well Patient left: in bed;with call bell/phone within reach;with bed alarm set  OT Visit Diagnosis: Other abnormalities of gait and mobility (R26.89)                Time: 2951-8841 OT Time Calculation (min): 23 min Charges:  OT General Charges $OT Visit: 1 Visit OT Evaluation $OT Eval Low Complexity: 1 Low OT Treatments $Self Care/Home Management : 8-22 mins  Kathie Dike, M.S. OTR/L  05/29/21, 3:21 PM  ascom [phone]

## 2021-05-29 NOTE — Progress Notes (Signed)
PROGRESS NOTE    MILISSA SWINK  WYS:[id number] DOB: Jun 21, 1926 DOA: 05/28/2021 PCP: [name], MD   Brief Narrative:  88 year old with history of DM 2, HLD, HTN, CVA, TIA, diverticulosis, anemia of chronic disease admitted to the hospital with recurrent hematochezia, dizziness and generalized weakness.  He also reports of some rectal bleeding.  He has had mild lower abdominal pain for the past several days.  Upon admission his hemoglobin was noted to be 10.5, UA was negative.  CT abdomen pelvis was suggestive of colonic diverticulosis, 2.2 cm right renal lesion concerning for malignancy?  Recommended obtaining MRI renal.   Assessment and Plan:  Hematochezia/lower GI bleed Lightheadedness and dizziness - Patient placed on PPI.  GI consulted. Hb 10.8 - Check CBC and trend closely. - Telemetry monitoring -Echocardiogram 2019-EF 55%, normal study -PT/OT  Last C scope 05/2012 = Multiple diverticula  Essential hypertension -Patient on Norvasc, lisinopril  Hyperlipidemia -Lipitor  Diabetes mellitus type 2 -Glipizide.  Sliding scale and Accu-Chek.  2.2 cm right renal lesion - Concern for malignancy.  Will need MRI abdomen/renal-nonemergent   DVT prophylaxis: SCDs Start: 05/29/21 0219 Code Status: DNR Family Communication:  Called Mrs Artis Flock, no answer. Marlinda Mike, her son. He is updated.   Status is: Observation The patient remains OBS appropriate and will d/c before 2 midnights.  Pending GI eval.        Nutritional status           There is no height or weight on file to calculate BMI.           Subjective: Patient is doing ok, no more bleeding.  Per son, she is mostly home bound.  [name] has had bouts of diverticulitis in the past.   Review of Systems Otherwise negative except as per HPI, including: General: Denies fever, chills, night sweats or unintended weight loss. Resp: Denies cough, wheezing, shortness of breath. Cardiac: Denies chest pain,  palpitations, orthopnea, paroxysmal nocturnal dyspnea. GI: Denies abdominal pain, nausea, vomiting, diarrhea or constipation GU: Denies dysuria, frequency, hesitancy or incontinence MS: Denies muscle aches, joint pain or swelling Neuro: Denies headache, neurologic deficits (focal weakness, numbness, tingling), abnormal gait Psych: Denies anxiety, depression, SI/HI/AVH Skin: Denies new rashes or lesions ID: Denies sick contacts, exotic exposures, travel  Examination:  General exam: Appears calm and comfortable . Elderly frail.  Respiratory system: Clear to auscultation. Respiratory effort normal. Cardiovascular system: S1 & S2 heard, RRR. No JVD, murmurs, rubs, gallops or clicks. No pedal edema. Gastrointestinal system: Abdomen is nondistended, soft and nontender. No organomegaly or masses felt. Normal bowel sounds heard. Central nervous system: Alert and oriented. No focal neurological deficits. Extremities: Symmetric 5 x 5 power. Skin: No rashes, lesions or ulcers Psychiatry: Judgement and insight appear normal. Mood & affect appropriate.   Objective: Vitals:   05/29/21 0600 05/29/21 0700 05/29/21 0735 05/29/21 0933  BP: (!) 152/65 (!) 162/70 (!) 165/88 (!) 178/89  Pulse: (!) 59 69 85   Resp: 14 20 20    Temp:      TempSrc:      SpO2: 100% 100% 100%     Intake/Output Summary (Last 24 hours) at 05/29/2021 0934 Last data filed at 05/29/2021 1115 Gross per 24 hour  Intake 1000 ml  Output 1850 ml  Net -850 ml   There were no vitals filed for this visit.   Data Reviewed:   CBC: Recent Labs  Lab 05/28/21 1932 05/29/21 0253 05/29/21 0639  WBC 7.2  --  5.3  HGB 10.5* 10.4* 10.8*  HCT 33.8* 33.3* 33.4*  MCV 88.7  --  87.2  PLT 255  --  241   Basic Metabolic Panel: Recent Labs  Lab 05/28/21 1932 05/29/21 0639  NA 138 140  K 4.1 4.0  CL 105 107  CO2 27 28  GLUCOSE 169* 144*  BUN 18 10  CREATININE 0.79 0.53  CALCIUM 9.7 9.4   GFR: CrCl cannot be calculated  (Unknown ideal weight.). Liver Function Tests: No results for input(s): AST, ALT, ALKPHOS, BILITOT, PROT, ALBUMIN in the last 168 hours. No results for input(s): LIPASE, AMYLASE in the last 168 hours. No results for input(s): AMMONIA in the last 168 hours. Coagulation Profile: No results for input(s): INR, PROTIME in the last 168 hours. Cardiac Enzymes: No results for input(s): CKTOTAL, CKMB, CKMBINDEX, TROPONINI in the last 168 hours. BNP (last 3 results) No results for input(s): PROBNP in the last 8760 hours. HbA1C: No results for input(s): HGBA1C in the last 72 hours. CBG: No results for input(s): GLUCAP in the last 168 hours. Lipid Profile: No results for input(s): CHOL, HDL, LDLCALC, TRIG, CHOLHDL, LDLDIRECT in the last 72 hours. Thyroid Function Tests: No results for input(s): TSH, T4TOTAL, FREET4, T3FREE, THYROIDAB in the last 72 hours. Anemia Panel: No results for input(s): VITAMINB12, FOLATE, FERRITIN, TIBC, IRON, RETICCTPCT in the last 72 hours. Sepsis Labs: No results for input(s): PROCALCITON, LATICACIDVEN in the last 168 hours.  Recent Results (from the past 240 hour(s))  Resp Panel by RT-PCR (Flu A&B, Covid) Nasopharyngeal Swab     Status: None   Collection Time: 05/28/21  7:32 PM   Specimen: Nasopharyngeal Swab; Nasopharyngeal(NP) swabs in vial transport medium  Result Value Ref Range Status   SARS Coronavirus 2 by RT PCR NEGATIVE NEGATIVE Final    Comment: (NOTE) SARS-CoV-2 target nucleic acids are NOT DETECTED.  The SARS-CoV-2 RNA is generally detectable in upper respiratory specimens during the acute phase of infection. The lowest concentration of SARS-CoV-2 viral copies this assay can detect is 138 copies/mL. A negative result does not preclude SARS-Cov-2 infection and should not be used as the sole basis for treatment or other patient management decisions. A negative result may occur with  improper specimen collection/handling, submission of specimen  other than nasopharyngeal swab, presence of viral mutation(s) within the areas targeted by this assay, and inadequate number of viral copies(<138 copies/mL). A negative result must be combined with clinical observations, patient history, and epidemiological information. The expected result is Negative.  Fact Sheet for Patients:  BloggerCourse.com  Fact Sheet for Healthcare Providers:  SeriousBroker.it  This test is no t yet approved or cleared by the [place] FDA and  has been authorized for detection and/or diagnosis of SARS-CoV-2 by FDA under an Emergency Use Authorization (EUA). This EUA will remain  in effect (meaning this test can be used) for the duration of the COVID-19 declaration under Section 564(b)(1) of the Act, 21 U.S.C.section 360bbb-3(b)(1), unless the authorization is terminated  or revoked sooner.       Influenza A by PCR NEGATIVE NEGATIVE Final   Influenza B by PCR NEGATIVE NEGATIVE Final    Comment: (NOTE) The Xpert Xpress SARS-CoV-2/FLU/RSV plus assay is intended as an aid in the diagnosis of influenza from Nasopharyngeal swab specimens and should not be used as a sole basis for treatment. Nasal washings and aspirates are unacceptable for Xpert Xpress SARS-CoV-2/FLU/RSV testing.  Fact Sheet for Patients: BloggerCourse.com  Fact Sheet for Healthcare Providers: SeriousBroker.it  This test  is not yet approved or cleared by the [place] FDA and has been authorized for detection and/or diagnosis of SARS-CoV-2 by FDA under an Emergency Use Authorization (EUA). This EUA will remain in effect (meaning this test can be used) for the duration of the COVID-19 declaration under Section 564(b)(1) of the Act, 21 U.S.C. section 360bbb-3(b)(1), unless the authorization is terminated or revoked.  Performed at [facility], [address]  [place], [place] [address]          Radiology Studies: CT ABDOMEN PELVIS W CONTRAST  Result Date: 05/29/2021 CLINICAL DATA:  hx colonic resection. lower abd pain and hematochezia. diverticulitis? EXAM: CT ABDOMEN AND PELVIS WITH CONTRAST TECHNIQUE: Multidetector CT imaging of the abdomen and pelvis was performed using the standard protocol following bolus administration of intravenous contrast. RADIATION DOSE REDUCTION: This exam was performed according to the departmental dose-optimization program which includes automated exposure control, adjustment of the mA and/or kV according to patient size and/or use of iterative reconstruction technique. CONTRAST:  75mL OMNIPAQUE IOHEXOL 300 MG/ML  SOLN COMPARISON:  None. FINDINGS: Lower chest: No acute abnormality. Mild aortic valve leaflet, mild mitral annular, coronary artery calcifications. Hepatobiliary: No focal liver abnormality. No gallstones, gallbladder wall thickening, or pericholecystic fluid. No biliary dilatation. Pancreas: Diffusely atrophic. No focal lesion. Otherwise normal pancreatic contour. No surrounding inflammatory changes. No main pancreatic ductal dilatation. Spleen: Cm hypodensity within the spleen too small to characterize. Otherwise normal in size without focal abnormality. Adrenals/Urinary Tract: No adrenal nodule bilaterally. Bilateral kidneys enhance symmetrically. There is a 2.2 x 1.6 cm hypodense lesion within the right kidney with a density of 67 Hounsfield units. Subcentimeter hypodensities too small to characterize. No hydronephrosis. No hydroureter. The urinary bladder is unremarkable. Stomach/Bowel: Partial colectomy. Stomach is within normal limits. No evidence of bowel wall thickening or dilatation. The appendix is not definitely identified with no inflammatory changes in the right lower quadrant to suggest acute appendicitis. Vascular/Lymphatic: No abdominal aorta or iliac aneurysm. Severe atherosclerotic plaque of the aorta and  its branches. No abdominal, pelvic, or inguinal lymphadenopathy. Reproductive: Uterus and bilateral adnexa are unremarkable. Other: Nonspecific swirling of the mesentery within the right lower lobe (5: 29-47). No intraperitoneal free fluid. No intraperitoneal free gas. No organized fluid collection. Slight laxity of the pelvic floor with no definite hernia (2:70). Musculoskeletal: Small fat containing umbilical hernia. No suspicious lytic or blastic osseous lesions. No acute displaced fracture. Old healed left transverse process fractures of the lumbar spine. IMPRESSION: 1. Colonic diverticulosis with no acute diverticulitis. 2. Indeterminate left 2.2 cm right renal lesion. Findings concerning for a malignancy. Recommend nonemergent MRI renal protocol for further evaluation. When the patient is clinically stable and able to follow directions and hold their breath (preferably as an outpatient) further evaluation with dedicated abdominal MRI should be considered. 3.  Aortic Atherosclerosis (ICD10-I70.0) - severe. 4. Small fat containing umbilical hernia. Electronically Signed   By: Tish FredericksonMorgane  Naveau M.D.   On: 05/29/2021 01:09   DG Chest Portable 1 View  Result Date: 05/28/2021 CLINICAL DATA:  Weakness EXAM: PORTABLE CHEST 1 VIEW COMPARISON:  08/24/2012 FINDINGS: The heart size and mediastinal contours are within normal limits. Both lungs are clear. Old left-sided rib fractures. Aortic atherosclerosis. IMPRESSION: No active disease. Electronically Signed   By: Jasmine PangKim  Fujinaga M.D.   On: 05/28/2021 20:06        Scheduled Meds:  amLODipine  10 mg Oral QAC supper   ascorbic acid  500 mg Oral QPM  atorvastatin  20 mg Oral QPC supper   calcium-vitamin D  1 tablet Oral BID   glipiZIDE  5 mg Oral BID   insulin aspart  0-9 Units Subcutaneous TID AC & HS   lisinopril  40 mg Oral Daily   multivitamin with minerals  1 tablet Oral Daily   pantoprazole (PROTONIX) IV  40 mg Intravenous Q24H   Continuous  Infusions:  sodium chloride 100 mL/hr at 05/29/21 0742     LOS: 0 days   Time spent= 35 mins    Yaffa Seckman Joline Maxcy, MD [facility]  If 7PM-7AM, please contact night-coverage  05/29/2021, 9:34 AM

## 2021-05-30 DIAGNOSIS — N2889 Other specified disorders of kidney and ureter: Secondary | ICD-10-CM | POA: Diagnosis not present

## 2021-05-30 DIAGNOSIS — E119 Type 2 diabetes mellitus without complications: Secondary | ICD-10-CM

## 2021-05-30 DIAGNOSIS — K922 Gastrointestinal hemorrhage, unspecified: Secondary | ICD-10-CM | POA: Diagnosis not present

## 2021-05-30 LAB — COMPREHENSIVE METABOLIC PANEL
ALT: 17 U/L (ref 0–44)
AST: 25 U/L (ref 15–41)
Albumin: 3.3 g/dL — ABNORMAL LOW (ref 3.5–5.0)
Alkaline Phosphatase: 39 U/L (ref 38–126)
Anion gap: 2 — ABNORMAL LOW (ref 5–15)
BUN: 10 mg/dL (ref 8–23)
CO2: 27 mmol/L (ref 22–32)
Calcium: 9.1 mg/dL (ref 8.9–10.3)
Chloride: 110 mmol/L (ref 98–111)
Creatinine, Ser: 0.71 mg/dL (ref 0.44–1.00)
GFR, Estimated: >60 mL/min (ref >60)
Glucose, Bld: 59 mg/dL — ABNORMAL LOW (ref 70–99)
Potassium: 3.8 mmol/L (ref 3.5–5.1)
Sodium: 139 mmol/L (ref 135–145)
Total Bilirubin: 0.6 mg/dL (ref 0.3–1.2)
Total Protein: 6.8 g/dL (ref 6.5–8.1)

## 2021-05-30 LAB — ECHOCARDIOGRAM COMPLETE
AR max vel: 2.97 cm2
AV Area VTI: 3.76 cm2
AV Area mean vel: 2.93 cm2
AV Mean grad: 3.7 mmHg
AV Peak grad: 7 mmHg
Ao pk vel: 1.32 m/s
Area-P 1/2: 2.75 cm2
MV VTI: 2.35 cm2
S' Lateral: 1.9 cm

## 2021-05-30 LAB — CBC
HCT: 30.6 % — ABNORMAL LOW (ref 36.0–46.0)
Hemoglobin: 9.7 g/dL — ABNORMAL LOW (ref 12.0–15.0)
MCH: 28 pg (ref 26.0–34.0)
MCHC: 31.7 g/dL (ref 30.0–36.0)
MCV: 88.2 fL (ref 80.0–100.0)
Platelets: 234 10*3/uL (ref 150–400)
RBC: 3.47 MIL/uL — ABNORMAL LOW (ref 3.87–5.11)
RDW: 13.9 % (ref 11.5–15.5)
WBC: 4.7 10*3/uL (ref 4.0–10.5)
nRBC: 0 % (ref 0.0–0.2)

## 2021-05-30 LAB — MAGNESIUM: Magnesium: 1.8 mg/dL (ref 1.7–2.4)

## 2021-05-30 LAB — GLUCOSE, CAPILLARY: Glucose-Capillary: 74 mg/dL (ref 70–99)

## 2021-05-30 NOTE — Discharge Summary (Signed)
Physician Discharge Summary  Susan [name] ZOX:[id number] DOB: Nov 01, 1926 DOA: 05/28/2021  PCP: [name], MD  Admit date: 05/28/2021 Discharge date: 05/30/2021  Admitted From: Home Disposition:  Home with [facility]  Recommendations for Outpatient Follow-up:  Follow up with PCP in 1-2 weeks Please obtain BMP/CBC in one week your next doctors visit.  Can obtain ouptn nonemergent MRI for renal Mass.   Home Health: PT/OT  Discharge Condition: Stable CODE STATUS: DNR Diet recommendation: Diabetic 88 year old with history of DM 2, HLD, HTN, CVA, TIA, diverticulosis, anemia of chronic disease admitted to the hospital with recurrent hematochezia, dizziness and generalized weakness.  He also reports of some rectal bleeding.  He has had mild lower abdominal pain for the past several days.  Upon admission his hemoglobin was noted to be 10.5, UA was negative.  CT abdomen pelvis was suggestive of colonic diverticulosis, 2.2 cm right renal lesion concerning for malignancy?  Recommended obtaining MRI renal. Hb remained stabled in the Hospital. She was seen by GI, and no further work up was planned as ptn was hesistant especially with stable Hb and resolution of her symptoms.  Echo was normal as well.  Stable for Dc.    Brief/Interim Summary: Hematochezia/lower GI bleed Lightheadedness and dizziness - Hb stable at baseline, Seen by GI. Bleeding resolved. Patient did not want C scope.  -Echocardiogram 2019-EF 55%, normal study Repeat echo normal as well -PT/OT- home health PT/PT   Last C scope 05/2012 = Multiple diverticula   Essential hypertension -Patient on Norvasc, lisinopril   Hyperlipidemia -Lipitor   Diabetes mellitus type 2 -Glipizide.   2.2 cm right renal lesion - Concern for malignancy.  Will need MRI abdomen/renal-nonemergent, can be done with PCP if willing to pursue this.     There is no height or weight on file to calculate BMI.         Discharge Diagnoses:  Principal  Problem:   GI bleeding      Consultations: GI  Subjective: Feeling great, tolerating PO. Want to go home.   Discharge Exam: Vitals:   05/30/21 0451 05/30/21 0744  BP: (!) 138/52 (!) 158/69  Pulse: 63 75  Resp:  16  Temp: 97.8 F (36.6 C) 97.9 F (36.6 C)  SpO2: 100% 94%   Vitals:   05/29/21 1614 05/29/21 2021 05/30/21 0451 05/30/21 0744  BP: 100/60 139/82 (!) 138/52 (!) 158/69  Pulse: 68 86 63 75  Resp: 18   16  Temp: 98.1 F (36.7 C) 98.2 F (36.8 C) 97.8 F (36.6 C) 97.9 F (36.6 C)  TempSrc:  Oral Oral Oral  SpO2: 100% 100% 100% 94%    General: Pt is alert, awake, not in acute distress Cardiovascular: RRR, S1/S2 +, no rubs, no gallops Respiratory: CTA bilaterally, no wheezing, no rhonchi Abdominal: Soft, NT, ND, bowel sounds + Extremities: no edema, no cyanosis  Discharge Instructions   Allergies as of 05/30/2021       Reactions   Atenolol Other (See Comments)   "affected my heart rate"   Naprosyn [naproxen] Other (See Comments)   GI Bleed        Medication List     STOP taking these medications    quinapril 40 MG tablet Commonly known as: ACCUPRIL       TAKE these medications    amLODipine 10 MG tablet Commonly known as: NORVASC Take 10 mg by mouth daily before supper.   ascorbic acid 500 MG tablet Commonly known as: VITAMIN C Take 500 mg  by mouth every evening.   aspirin EC 81 MG tablet Take 81 mg by mouth daily. Swallow whole.   atorvastatin 20 MG tablet Commonly known as: LIPITOR Take 20 mg by mouth daily after supper.   Calcium Carbonate-Vitamin D 600-400 MG-UNIT tablet Take 1 tablet by mouth 2 (two) times daily.   glipiZIDE 5 MG 24 hr tablet Commonly known as: GLUCOTROL XL Take 5 mg by mouth 2 (two) times daily.   hydrochlorothiazide 12.5 MG capsule Commonly known as: MICROZIDE Take 12.5 mg by mouth daily.   latanoprost 0.005 % ophthalmic solution Commonly known as: XALATAN 1 drop at bedtime.   lisinopril 40  MG tablet Commonly known as: ZESTRIL Take 40 mg by mouth daily.   metFORMIN 500 MG tablet Commonly known as: GLUCOPHAGE Take 500 mg by mouth 2 (two) times daily with a meal.   multivitamin with minerals Tabs tablet Take 1 tablet by mouth daily.        Follow-up Information     [name], MD. Go on 06/06/2021.   Specialty: Family Medicine Why: Office will call patient to schedule follow up appt per [place] Contact information: [address] [place] [address] [phone]                Allergies  Allergen Reactions   Atenolol Other (See Comments)    "affected my heart rate"   Naprosyn [Naproxen] Other (See Comments)    GI Bleed    You were cared for by a hospitalist during your hospital stay. If you have any questions about your discharge medications or the care you received while you were in the hospital after you are discharged, you can call the unit and asked to speak with the hospitalist on call if the hospitalist that took care of you is not available. Once you are discharged, your primary care physician will handle any further medical issues. Please note that no refills for any discharge medications will be authorized once you are discharged, as it is imperative that you return to your primary care physician (or establish a relationship with a primary care physician if you do not have one) for your aftercare needs so that they can reassess your need for medications and monitor your lab values.   Procedures/Studies: CT ABDOMEN PELVIS W CONTRAST  Result Date: 05/29/2021 CLINICAL DATA:  hx colonic resection. lower abd pain and hematochezia. diverticulitis? EXAM: CT ABDOMEN AND PELVIS WITH CONTRAST TECHNIQUE: Multidetector CT imaging of the abdomen and pelvis was performed using the standard protocol following bolus administration of intravenous contrast. RADIATION DOSE REDUCTION: This exam was performed according to the departmental dose-optimization  program which includes automated exposure control, adjustment of the mA and/or kV according to patient size and/or use of iterative reconstruction technique. CONTRAST:  75mL OMNIPAQUE IOHEXOL 300 MG/ML  SOLN COMPARISON:  None. FINDINGS: Lower chest: No acute abnormality. Mild aortic valve leaflet, mild mitral annular, coronary artery calcifications. Hepatobiliary: No focal liver abnormality. No gallstones, gallbladder wall thickening, or pericholecystic fluid. No biliary dilatation. Pancreas: Diffusely atrophic. No focal lesion. Otherwise normal pancreatic contour. No surrounding inflammatory changes. No main pancreatic ductal dilatation. Spleen: Cm hypodensity within the spleen too small to characterize. Otherwise normal in size without focal abnormality. Adrenals/Urinary Tract: No adrenal nodule bilaterally. Bilateral kidneys enhance symmetrically. There is a 2.2 x 1.6 cm hypodense lesion within the right kidney with a density of 67 Hounsfield units. Subcentimeter hypodensities too small to characterize. No hydronephrosis. No hydroureter. The urinary bladder is unremarkable.  Stomach/Bowel: Partial colectomy. Stomach is within normal limits. No evidence of bowel wall thickening or dilatation. The appendix is not definitely identified with no inflammatory changes in the right lower quadrant to suggest acute appendicitis. Vascular/Lymphatic: No abdominal aorta or iliac aneurysm. Severe atherosclerotic plaque of the aorta and its branches. No abdominal, pelvic, or inguinal lymphadenopathy. Reproductive: Uterus and bilateral adnexa are unremarkable. Other: Nonspecific swirling of the mesentery within the right lower lobe (5: 29-47). No intraperitoneal free fluid. No intraperitoneal free gas. No organized fluid collection. Slight laxity of the pelvic floor with no definite hernia (2:70). Musculoskeletal: Small fat containing umbilical hernia. No suspicious lytic or blastic osseous lesions. No acute displaced fracture.  Old healed left transverse process fractures of the lumbar spine. IMPRESSION: 1. Colonic diverticulosis with no acute diverticulitis. 2. Indeterminate left 2.2 cm right renal lesion. Findings concerning for a malignancy. Recommend nonemergent MRI renal protocol for further evaluation. When the patient is clinically stable and able to follow directions and hold their breath (preferably as an outpatient) further evaluation with dedicated abdominal MRI should be considered. 3.  Aortic Atherosclerosis (ICD10-I70.0) - severe. 4. Small fat containing umbilical hernia. Electronically Signed   By: Tish FredericksonMorgane  Naveau M.D.   On: 05/29/2021 01:09   DG Chest Portable 1 View  Result Date: 05/28/2021 CLINICAL DATA:  Weakness EXAM: PORTABLE CHEST 1 VIEW COMPARISON:  08/24/2012 FINDINGS: The heart size and mediastinal contours are within normal limits. Both lungs are clear. Old left-sided rib fractures. Aortic atherosclerosis. IMPRESSION: No active disease. Electronically Signed   By: Jasmine PangKim  Fujinaga M.D.   On: 05/28/2021 20:06   ECHOCARDIOGRAM COMPLETE  Result Date: 05/30/2021    ECHOCARDIOGRAM REPORT   Patient Name:   Benjamin StainMARY I [name] Date of Exam: 05/29/2021 Medical Rec #:  [id number]   Height:       66.0 in Accession #:    [id number]  Weight:       118.0 lb Date of Birth:  Feb 27, 1927   BSA:          1.598 m Patient Age:    89 years    BP:           164/73 mmHg Patient Gender: F           HR:           66 bpm. Exam Location:  [facility] Procedure: 2D Echo, Color Doppler and Cardiac Doppler Indications:     Syncope R55  History:         Patient has prior history of Echocardiogram examinations, most                  recent 07/05/2017. TIA and Stroke, Signs/Symptoms:Murmur; Risk                  Factors:Hypertension and Diabetes.  Sonographer:     Cristela BlueJerry Hege Referring Phys:  [id number] Loura HaltNKIT CHIRAG Adaijah Endres Diagnosing Phys: Arnoldo HookerBruce Kowalski MD  Sonographer Comments: Suboptimal apical window. IMPRESSIONS  1. Left ventricular ejection fraction, by  estimation, is 60 to 65%. The left ventricle has normal function. The left ventricle has no regional wall motion abnormalities. Left ventricular diastolic parameters were normal.  2. Right ventricular systolic function is normal. The right ventricular size is normal.  3. The mitral valve is normal in structure. Trivial mitral valve regurgitation.  4. The aortic valve is normal in structure. Aortic valve regurgitation is trivial. FINDINGS  Left Ventricle: Left ventricular ejection fraction, by estimation, is 60 to 65%. The left ventricle  has normal function. The left ventricle has no regional wall motion abnormalities. The left ventricular internal cavity size was normal in size. There is  no left ventricular hypertrophy. Left ventricular diastolic parameters were normal. Right Ventricle: The right ventricular size is normal. No increase in right ventricular wall thickness. Right ventricular systolic function is normal. Left Atrium: Left atrial size was normal in size. Right Atrium: Right atrial size was normal in size. Pericardium: There is no evidence of pericardial effusion. Mitral Valve: The mitral valve is normal in structure. Trivial mitral valve regurgitation. MV peak gradient, 11.8 mmHg. The mean mitral valve gradient is 4.0 mmHg. Tricuspid Valve: The tricuspid valve is normal in structure. Tricuspid valve regurgitation is trivial. Aortic Valve: The aortic valve is normal in structure. Aortic valve regurgitation is trivial. Aortic valve mean gradient measures 3.7 mmHg. Aortic valve peak gradient measures 7.0 mmHg. Aortic valve area, by VTI measures 3.76 cm. Pulmonic Valve: The pulmonic valve was normal in structure. Pulmonic valve regurgitation is not visualized. Aorta: The aortic root and ascending aorta are structurally normal, with no evidence of dilitation. IAS/Shunts: No atrial level shunt detected by color flow Doppler.  LEFT VENTRICLE PLAX 2D LVIDd:         2.70 cm   Diastology LVIDs:         1.90 cm    LV e' medial:    3.26 cm/s LV PW:         1.00 cm   LV E/e' medial:  31.0 LV IVS:        0.85 cm   LV e' lateral:   4.13 cm/s LVOT diam:     2.00 cm   LV E/e' lateral: 24.5 LV SV:         91 LV SV Index:   57 LVOT Area:     3.14 cm  RIGHT VENTRICLE RV Basal diam:  3.10 cm RV S prime:     18.40 cm/s TAPSE (M-mode): 3.2 cm LEFT ATRIUM             Index        RIGHT ATRIUM           Index LA diam:        2.90 cm 1.81 cm/m   RA Area:     16.30 cm LA Vol (A2C):   36.8 ml 23.03 ml/m  RA Volume:   42.60 ml  26.66 ml/m LA Vol (A4C):   28.0 ml 17.52 ml/m LA Biplane Vol: 32.0 ml 20.02 ml/m  AORTIC VALVE AV Area (Vmax):    2.97 cm AV Area (Vmean):   2.93 cm AV Area (VTI):     3.76 cm AV Vmax:           132.33 cm/s AV Vmean:          88.433 cm/s AV VTI:            0.242 m AV Peak Grad:      7.0 mmHg AV Mean Grad:      3.7 mmHg LVOT Vmax:         125.00 cm/s LVOT Vmean:        82.600 cm/s LVOT VTI:          0.290 m LVOT/AV VTI ratio: 1.20  AORTA Ao Root diam: 2.70 cm MITRAL VALVE                TRICUSPID VALVE MV Area (PHT): 2.75 cm     TR Peak grad:  15.2 mmHg MV Area VTI:   2.35 cm     TR Vmax:        195.00 cm/s MV Peak grad:  11.8 mmHg MV Mean grad:  4.0 mmHg     SHUNTS MV Vmax:       1.72 m/s     Systemic VTI:  0.29 m MV Vmean:      96.4 cm/s    Systemic Diam: 2.00 cm MV Decel Time: 276 msec MV E velocity: 101.00 cm/s MV A velocity: 132.00 cm/s MV E/A ratio:  0.77 Arnoldo Hooker MD Electronically signed by Arnoldo Hooker MD Signature Date/Time: 05/30/2021/9:07:26 AM    Final      The results of significant diagnostics from this hospitalization (including imaging, microbiology, ancillary and laboratory) are listed below for reference.     Microbiology: Recent Results (from the past 240 hour(s))  Resp Panel by RT-PCR (Flu A&B, Covid) Nasopharyngeal Swab     Status: None   Collection Time: 05/28/21  7:32 PM   Specimen: Nasopharyngeal Swab; Nasopharyngeal(NP) swabs in vial transport medium  Result Value  Ref Range Status   SARS Coronavirus 2 by RT PCR NEGATIVE NEGATIVE Final    Comment: (NOTE) SARS-CoV-2 target nucleic acids are NOT DETECTED.  The SARS-CoV-2 RNA is generally detectable in upper respiratory specimens during the acute phase of infection. The lowest concentration of SARS-CoV-2 viral copies this assay can detect is 138 copies/mL. A negative result does not preclude SARS-Cov-2 infection and should not be used as the sole basis for treatment or other patient management decisions. A negative result may occur with  improper specimen collection/handling, submission of specimen other than nasopharyngeal swab, presence of viral mutation(s) within the areas targeted by this assay, and inadequate number of viral copies(<138 copies/mL). A negative result must be combined with clinical observations, patient history, and epidemiological information. The expected result is Negative.  Fact Sheet for Patients:  BloggerCourse.com  Fact Sheet for Healthcare Providers:  SeriousBroker.it  This test is no t yet approved or cleared by the [place] FDA and  has been authorized for detection and/or diagnosis of SARS-CoV-2 by FDA under an Emergency Use Authorization (EUA). This EUA will remain  in effect (meaning this test can be used) for the duration of the COVID-19 declaration under Section 564(b)(1) of the Act, 21 U.S.C.section 360bbb-3(b)(1), unless the authorization is terminated  or revoked sooner.       Influenza A by PCR NEGATIVE NEGATIVE Final   Influenza B by PCR NEGATIVE NEGATIVE Final    Comment: (NOTE) The Xpert Xpress SARS-CoV-2/FLU/RSV plus assay is intended as an aid in the diagnosis of influenza from Nasopharyngeal swab specimens and should not be used as a sole basis for treatment. Nasal washings and aspirates are unacceptable for Xpert Xpress SARS-CoV-2/FLU/RSV testing.  Fact Sheet for  Patients: BloggerCourse.com  Fact Sheet for Healthcare Providers: SeriousBroker.it  This test is not yet approved or cleared by the [place] FDA and has been authorized for detection and/or diagnosis of SARS-CoV-2 by FDA under an Emergency Use Authorization (EUA). This EUA will remain in effect (meaning this test can be used) for the duration of the COVID-19 declaration under Section 564(b)(1) of the Act, 21 U.S.C. section 360bbb-3(b)(1), unless the authorization is terminated or revoked.  Performed at [facility], [address] [place], [place] [address]      Labs: BNP (last 3 results) No results for input(s): BNP in the last 8760 hours. Basic Metabolic Panel: Recent Labs  Lab 05/28/21 1932 05/29/21 0639 05/30/21 0405  NA 138 140 139  K 4.1 4.0 3.8  CL 105 107 110  CO2 27 28 27   GLUCOSE 169* 144* 59*  BUN 18 10 10   CREATININE 0.79 0.53 0.71  CALCIUM 9.7 9.4 9.1  MG  --   --  1.8   Liver Function Tests: Recent Labs  Lab 05/30/21 0405  AST 25  ALT 17  ALKPHOS 39  BILITOT 0.6  PROT 6.8  ALBUMIN 3.3*   No results for input(s): LIPASE, AMYLASE in the last 168 hours. No results for input(s): AMMONIA in the last 168 hours. CBC: Recent Labs  Lab 05/28/21 1932 05/29/21 0253 05/29/21 0639 05/29/21 1444 05/29/21 2027 05/30/21 0405  WBC 7.2  --  5.3  --   --  4.7  HGB 10.5* 10.4* 10.8* 10.4* 9.6* 9.7*  HCT 33.8* 33.3* 33.4* 32.0* 30.2* 30.6*  MCV 88.7  --  87.2  --   --  88.2  PLT 255  --  241  --   --  234   Cardiac Enzymes: No results for input(s): CKTOTAL, CKMB, CKMBINDEX, TROPONINI in the last 168 hours. BNP: Invalid input(s): POCBNP CBG: Recent Labs  Lab 05/29/21 1616 05/29/21 2019 05/30/21 0835  GLUCAP 88 262* 74   D-Dimer No results for input(s): DDIMER in the last 72 hours. Hgb A1c Recent Labs    05/29/21 0639  HGBA1C 8.3*   Lipid Profile No results for input(s):  CHOL, HDL, LDLCALC, TRIG, CHOLHDL, LDLDIRECT in the last 72 hours. Thyroid function studies No results for input(s): TSH, T4TOTAL, T3FREE, THYROIDAB in the last 72 hours.  Invalid input(s): FREET3 Anemia work up No results for input(s): VITAMINB12, FOLATE, FERRITIN, TIBC, IRON, RETICCTPCT in the last 72 hours. Urinalysis    Component Value Date/Time   COLORURINE YELLOW (A) 05/29/2021 0047   APPEARANCEUR CLEAR (A) 05/29/2021 0047   APPEARANCEUR Clear 08/24/2012 1203   LABSPEC 1.015 05/29/2021 0047   LABSPEC 1.017 08/24/2012 1203   PHURINE 5.0 05/29/2021 0047   GLUCOSEU 50 (A) 05/29/2021 0047   GLUCOSEU 50 mg/dL 47/82/956205/13/2014 [id number]   HGBUR SMALL (A) 05/29/2021 0047   BILIRUBINUR NEGATIVE 05/29/2021 0047   BILIRUBINUR Negative 08/24/2012 1203   KETONESUR NEGATIVE 05/29/2021 0047   PROTEINUR NEGATIVE 05/29/2021 0047   NITRITE NEGATIVE 05/29/2021 0047   LEUKOCYTESUR NEGATIVE 05/29/2021 0047   LEUKOCYTESUR Trace 08/24/2012 1203   Sepsis Labs Invalid input(s): PROCALCITONIN,  WBC,  LACTICIDVEN Microbiology Recent Results (from the past 240 hour(s))  Resp Panel by RT-PCR (Flu A&B, Covid) Nasopharyngeal Swab     Status: None   Collection Time: 05/28/21  7:32 PM   Specimen: Nasopharyngeal Swab; Nasopharyngeal(NP) swabs in vial transport medium  Result Value Ref Range Status   SARS Coronavirus 2 by RT PCR NEGATIVE NEGATIVE Final    Comment: (NOTE) SARS-CoV-2 target nucleic acids are NOT DETECTED.  The SARS-CoV-2 RNA is generally detectable in upper respiratory specimens during the acute phase of infection. The lowest concentration of SARS-CoV-2 viral copies this assay can detect is 138 copies/mL. A negative result does not preclude SARS-Cov-2 infection and should not be used as the sole basis for treatment or other patient management decisions. A negative result may occur with  improper specimen collection/handling, submission of specimen other than nasopharyngeal swab, presence of  viral mutation(s) within the areas targeted by this assay, and inadequate number of viral copies(<138 copies/mL). A negative result must be combined with clinical observations, patient history, and epidemiological information. The  expected result is Negative.  Fact Sheet for Patients:  BloggerCourse.com  Fact Sheet for Healthcare Providers:  SeriousBroker.it  This test is no t yet approved or cleared by the [place] FDA and  has been authorized for detection and/or diagnosis of SARS-CoV-2 by FDA under an Emergency Use Authorization (EUA). This EUA will remain  in effect (meaning this test can be used) for the duration of the COVID-19 declaration under Section 564(b)(1) of the Act, 21 U.S.C.section 360bbb-3(b)(1), unless the authorization is terminated  or revoked sooner.       Influenza A by PCR NEGATIVE NEGATIVE Final   Influenza B by PCR NEGATIVE NEGATIVE Final    Comment: (NOTE) The Xpert Xpress SARS-CoV-2/FLU/RSV plus assay is intended as an aid in the diagnosis of influenza from Nasopharyngeal swab specimens and should not be used as a sole basis for treatment. Nasal washings and aspirates are unacceptable for Xpert Xpress SARS-CoV-2/FLU/RSV testing.  Fact Sheet for Patients: BloggerCourse.com  Fact Sheet for Healthcare Providers: SeriousBroker.it  This test is not yet approved or cleared by the [place] FDA and has been authorized for detection and/or diagnosis of SARS-CoV-2 by FDA under an Emergency Use Authorization (EUA). This EUA will remain in effect (meaning this test can be used) for the duration of the COVID-19 declaration under Section 564(b)(1) of the Act, 21 U.S.C. section 360bbb-3(b)(1), unless the authorization is terminated or revoked.  Performed at [facility], [address] [place], [place] [address]      Time coordinating  discharge:  I have spent 35 minutes face to face with the patient and on the ward discussing the patients care, assessment, plan and disposition with other care givers. >50% of the time was devoted counseling the patient about the risks and benefits of treatment/Discharge disposition and coordinating care.   SIGNED:   Dimple Nanas, MD  [facility] 05/30/2021, 11:25 AM   If 7PM-7AM, please contact night-coverage

## 2021-05-30 NOTE — TOC Initial Note (Signed)
Transition of Care [facility]) - Initial/Assessment Note    Patient Details  Name: Susan [name] MRN: [id number] Date of Birth: 15-Oct-1926  [facility] CM/SW Contact:    Caryn Section, RN Phone Number: 05/30/2021, 11:41 AM  Clinical Narrative:     Patient lives by herself in a disabled accessible apartment.  She has a walker, rollator and wheelchair at home.  She states she has many folks nearby who can assist her if needed.  She accepts home health with [facility], confirmed served by [employer].  Patient has no concerns about appointments, states she is up to date with PCP, she has mainly virtual appointments but has transportation if needed.    She uses pill box for meds, and assembles it for one month at a time without challenges.  Patient feels safe and comfortable to return home at discharge.              Expected Discharge Plan: Home w Home Health Services Barriers to Discharge: Barriers Resolved   Patient Goals and CMS Choice     Choice offered to / list presented to : NA  Expected Discharge Plan and Services Expected Discharge Plan: Home w Home Health Services   Discharge Planning Services: CM Consult Post Acute Care Choice: Home Health Living arrangements for the past 2 months: Apartment Expected Discharge Date: 05/30/21               DME Arranged:  (N/a patient has DME at home)         HH Arranged: PT, OT HH Agency: [facility] Date HH Agency Contacted: 05/30/21 Time HH Agency Contacted: 1000 Representative spoke with at [facility] Agency: Barbara Cower  Prior Living Arrangements/Services Living arrangements for the past 2 months: Apartment Lives with:: Self Patient language and need for interpreter reviewed:: Yes (No interpreter required) Do you feel safe going back to the place where you live?: Yes      Need for Family Participation in Patient Care: Yes (Comment) Care giver support system in place?: Yes (comment) Current home services:  DME Criminal Activity/Legal Involvement Pertinent to Current Situation/Hospitalization: No - Comment as needed  Activities of Daily Living Home Assistive Devices/Equipment: Wheelchair ADL Screening (condition at time of admission) Patient's cognitive ability adequate to safely complete daily activities?: No Is the patient deaf or have difficulty hearing?: Yes Does the patient have difficulty seeing, even when wearing glasses/contacts?: No Does the patient have difficulty concentrating, remembering, or making decisions?: Yes Patient able to express need for assistance with ADLs?: Yes Does the patient have difficulty dressing or bathing?: Yes Independently performs ADLs?: No Does the patient have difficulty walking or climbing stairs?: Yes Weakness of Legs: Both Weakness of Arms/Hands: None  Permission Sought/Granted Permission sought to share information with : Case Manager Permission granted to share information with : Yes, Verbal Permission Granted     Permission granted to share info w AGENCY: [facility]        Emotional Assessment Appearance:: Appears stated age Attitude/Demeanor/Rapport: Gracious, Engaged Affect (typically observed): Pleasant, Appropriate Orientation: : Oriented to Self, Oriented to Place, Oriented to  Time, Oriented to Situation Alcohol / Substance Use: Not Applicable Psych Involvement: No (comment)  Admission diagnosis:  Hematochezia [K92.1] Dizziness [R42] GI bleeding [K92.2] Patient Active Problem List   Diagnosis Date Noted   GI bleeding 05/29/2021   CVA (cerebral vascular accident) (HCC) 07/04/2017   PCP:  [name], MD Pharmacy:   [facility] [phone] -  [place], [place] - [address] [place] [address] Phone: [phone] Fax: [phone]     Social Determinants of Health (SDOH) Interventions    Readmission Risk Interventions No flowsheet data found.

## 2021-10-27 ENCOUNTER — Other Ambulatory Visit: Payer: Self-pay

## 2021-10-27 ENCOUNTER — Inpatient Hospital Stay
Admission: EM | Admit: 2021-10-27 | Discharge: 2021-10-31 | DRG: 378 | Disposition: A | Discharge status: Home / Self Care | Payer: Medicare Other | Attending: Internal Medicine | Admitting: Internal Medicine

## 2021-10-27 DIAGNOSIS — D696 Thrombocytopenia, unspecified: Secondary | ICD-10-CM | POA: Diagnosis present

## 2021-10-27 DIAGNOSIS — K640 First degree hemorrhoids: Secondary | ICD-10-CM | POA: Diagnosis present

## 2021-10-27 DIAGNOSIS — K5731 Diverticulosis of large intestine without perforation or abscess with bleeding: Secondary | ICD-10-CM | POA: Diagnosis present

## 2021-10-27 DIAGNOSIS — Z8673 Personal history of transient ischemic attack (TIA), and cerebral infarction without residual deficits: Secondary | ICD-10-CM | POA: Diagnosis not present

## 2021-10-27 DIAGNOSIS — Z888 Allergy status to other drugs, medicaments and biological substances status: Secondary | ICD-10-CM

## 2021-10-27 DIAGNOSIS — Z7982 Long term (current) use of aspirin: Secondary | ICD-10-CM

## 2021-10-27 DIAGNOSIS — Z87891 Personal history of nicotine dependence: Secondary | ICD-10-CM | POA: Diagnosis not present

## 2021-10-27 DIAGNOSIS — Z993 Dependence on wheelchair: Secondary | ICD-10-CM

## 2021-10-27 DIAGNOSIS — R011 Cardiac murmur, unspecified: Secondary | ICD-10-CM | POA: Diagnosis present

## 2021-10-27 DIAGNOSIS — R2681 Unsteadiness on feet: Secondary | ICD-10-CM | POA: Diagnosis present

## 2021-10-27 DIAGNOSIS — N289 Disorder of kidney and ureter, unspecified: Secondary | ICD-10-CM | POA: Diagnosis present

## 2021-10-27 DIAGNOSIS — I700 Atherosclerosis of aorta: Secondary | ICD-10-CM | POA: Diagnosis present

## 2021-10-27 DIAGNOSIS — K625 Hemorrhage of anus and rectum: Secondary | ICD-10-CM | POA: Diagnosis present

## 2021-10-27 DIAGNOSIS — K922 Gastrointestinal hemorrhage, unspecified: Secondary | ICD-10-CM | POA: Diagnosis not present

## 2021-10-27 DIAGNOSIS — Z79899 Other long term (current) drug therapy: Secondary | ICD-10-CM

## 2021-10-27 DIAGNOSIS — I10 Essential (primary) hypertension: Secondary | ICD-10-CM | POA: Diagnosis present

## 2021-10-27 DIAGNOSIS — Z886 Allergy status to analgesic agent status: Secondary | ICD-10-CM | POA: Diagnosis not present

## 2021-10-27 DIAGNOSIS — K921 Melena: Secondary | ICD-10-CM | POA: Diagnosis present

## 2021-10-27 DIAGNOSIS — D62 Acute posthemorrhagic anemia: Secondary | ICD-10-CM | POA: Diagnosis present

## 2021-10-27 DIAGNOSIS — E119 Type 2 diabetes mellitus without complications: Secondary | ICD-10-CM | POA: Diagnosis present

## 2021-10-27 DIAGNOSIS — Z7984 Long term (current) use of oral hypoglycemic drugs: Secondary | ICD-10-CM | POA: Diagnosis not present

## 2021-10-27 DIAGNOSIS — K429 Umbilical hernia without obstruction or gangrene: Secondary | ICD-10-CM | POA: Diagnosis present

## 2021-10-27 DIAGNOSIS — E7800 Pure hypercholesterolemia, unspecified: Secondary | ICD-10-CM | POA: Diagnosis present

## 2021-10-27 LAB — COMPREHENSIVE METABOLIC PANEL
ALT: 16 U/L (ref 0–44)
AST: 29 U/L (ref 15–41)
Albumin: 3.2 g/dL — ABNORMAL LOW (ref 3.5–5.0)
Alkaline Phosphatase: 33 U/L — ABNORMAL LOW (ref 38–126)
Anion gap: 7 (ref 5–15)
BUN: 25 mg/dL — ABNORMAL HIGH (ref 8–23)
CO2: 25 mmol/L (ref 22–32)
Calcium: 8.9 mg/dL (ref 8.9–10.3)
Chloride: 103 mmol/L (ref 98–111)
Creatinine, Ser: 0.9 mg/dL (ref 0.44–1.00)
GFR, Estimated: 60 mL/min — ABNORMAL LOW (ref >60)
Glucose, Bld: 307 mg/dL — ABNORMAL HIGH (ref 70–99)
Potassium: 3.9 mmol/L (ref 3.5–5.1)
Sodium: 135 mmol/L (ref 135–145)
Total Bilirubin: 0.4 mg/dL (ref 0.3–1.2)
Total Protein: 6.3 g/dL — ABNORMAL LOW (ref 6.5–8.1)

## 2021-10-27 LAB — CBC WITH DIFFERENTIAL/PLATELET
Abs Immature Granulocytes: 0.01 10*3/uL (ref 0.00–0.07)
Basophils Absolute: 0 10*3/uL (ref 0.0–0.1)
Basophils Relative: 1 %
Eosinophils Absolute: 0.2 10*3/uL (ref 0.0–0.5)
Eosinophils Relative: 3 %
HCT: 20.8 % — ABNORMAL LOW (ref 36.0–46.0)
Hemoglobin: 6.4 g/dL — ABNORMAL LOW (ref 12.0–15.0)
Immature Granulocytes: 0 %
Lymphocytes Relative: 26 %
Lymphs Abs: 1.8 10*3/uL (ref 0.7–4.0)
MCH: 26.6 pg (ref 26.0–34.0)
MCHC: 30.8 g/dL (ref 30.0–36.0)
MCV: 86.3 fL (ref 80.0–100.0)
Monocytes Absolute: 0.6 10*3/uL (ref 0.1–1.0)
Monocytes Relative: 9 %
Neutro Abs: 4.2 10*3/uL (ref 1.7–7.7)
Neutrophils Relative %: 61 %
Platelets: 193 10*3/uL (ref 150–400)
RBC: 2.41 MIL/uL — ABNORMAL LOW (ref 3.87–5.11)
RDW: 15.2 % (ref 11.5–15.5)
WBC: 7 10*3/uL (ref 4.0–10.5)
nRBC: 0 % (ref 0.0–0.2)

## 2021-10-27 LAB — SAMPLE TO BLOOD BANK

## 2021-10-27 LAB — PREPARE RBC (CROSSMATCH)

## 2021-10-27 LAB — ABO/RH: ABO/RH(D): O POS

## 2021-10-27 MED ORDER — SODIUM CHLORIDE 0.9 % IV SOLN
10.0000 mL/h | Freq: Once | INTRAVENOUS | Status: AC
  Administered 2021-10-28: 10 mL/h via INTRAVENOUS

## 2021-10-27 MED ORDER — INSULIN ASPART 100 UNIT/ML IJ SOLN
0.0000 [IU] | Freq: Three times a day (TID) | INTRAMUSCULAR | Status: DC
  Administered 2021-10-28 – 2021-10-29 (×2): 1 [IU] via SUBCUTANEOUS
  Administered 2021-10-30: 5 [IU] via SUBCUTANEOUS
  Administered 2021-10-30 – 2021-10-31 (×2): 1 [IU] via SUBCUTANEOUS
  Filled 2021-10-27 (×5): qty 1

## 2021-10-27 MED ORDER — ACETAMINOPHEN 650 MG RE SUPP: 650.0000 mg | Freq: Four times a day (QID) | RECTAL | Status: DC | PRN

## 2021-10-27 MED ORDER — ALBUTEROL SULFATE (2.5 MG/3ML) 0.083% IN NEBU: 2.5000 mg | INHALATION_SOLUTION | RESPIRATORY_TRACT | Status: DC | PRN

## 2021-10-27 MED ORDER — SODIUM CHLORIDE 0.9 % IV SOLN: INTRAVENOUS | Status: AC

## 2021-10-27 MED ORDER — ACETAMINOPHEN 325 MG PO TABS: 650.0000 mg | ORAL_TABLET | Freq: Four times a day (QID) | ORAL | Status: DC | PRN

## 2021-10-27 MED ORDER — ONDANSETRON HCL 4 MG/2ML IJ SOLN: 4.0000 mg | Freq: Four times a day (QID) | INTRAMUSCULAR | Status: DC | PRN

## 2021-10-27 MED ORDER — ONDANSETRON HCL 4 MG PO TABS: 4.0000 mg | ORAL_TABLET | Freq: Four times a day (QID) | ORAL | Status: DC | PRN

## 2021-10-27 MED ORDER — PANTOPRAZOLE SODIUM 40 MG IV SOLR
40.0000 mg | Freq: Two times a day (BID) | INTRAVENOUS | Status: DC
  Administered 2021-10-28 – 2021-10-31 (×8): 40 mg via INTRAVENOUS
  Filled 2021-10-27 (×8): qty 10

## 2021-10-27 NOTE — ED Provider Notes (Signed)
[facility] Provider Note    Event Date/Time   First MD Initiated Contact with Patient 10/27/21 2113     (approximate)  History   Chief Complaint: Rectal Bleeding  HPI  Susan [name] is a 88 y.o. female with a past medical history of anemia, diabetes, hypertension, hyperlipidemia, CVA, prior lower GI bleed due to diverticulosis per patient presents emergency department for bright red blood per rectum.  According to the patient since Friday she has been experiencing bright red blood per rectum along with larger blood clots at times.  Patient states last time this happened it stopped after a day or so patient was trying to wait it out but states the bleeding Worsening then today was feeling somewhat weak as well.  Patient was concerned that her blood counts could have dropped so she came to the emergency department for evaluation.  Patient denies any nausea or vomiting denies any abdominal pain.  Physical Exam   Triage Vital Signs: ED Triage Vitals  Enc Vitals Group     BP 10/27/21 1610 (!) 142/57     Pulse Rate 10/27/21 1610 96     Resp 10/27/21 1610 16     Temp 10/27/21 1610 97.9 F (36.6 C)     Temp Source 10/27/21 1610 Oral     SpO2 10/27/21 1610 98 %     Weight --      Height --      Head Circumference --      Peak Flow --      Pain Score 10/27/21 1609 0     Pain Loc --      Pain Edu? --      Excl. in GC? --     Most recent vital signs: Vitals:   10/27/21 1610  BP: (!) 142/57  Pulse: 96  Resp: 16  Temp: 97.9 F (36.6 C)  SpO2: 98%    General: Awake, no distress.  CV:  Good peripheral perfusion.  Regular rate and rhythm  Resp:  Normal effort.  Equal breath sounds bilaterally.  Abd:  No distention.  Soft, nontender.  No rebound or guarding.  ED Results / Procedures / Treatments   MEDICATIONS ORDERED IN ED: Medications  0.9 %  sodium chloride infusion (has no administration in time range)     IMPRESSION / MDM / ASSESSMENT AND PLAN /  ED COURSE  I reviewed the triage vital signs and the nursing notes.  Patient's presentation is most consistent with acute presentation with potential threat to life or bodily function.  Patient presents emergency department for what appears to be a lower GI bleed.  Bright red blood per rectum for the past 2 days.  Patient's hemoglobin has dropped from 9.7 to currently 6.4.  I discussed with patient risk and benefits of blood transfusion.  Patient has had a blood transfusion previously and is agreeable and has consented to blood products.  Patient's lab work otherwise shows a reassuring/baseline chemistry, type and screen is pending.  We will speak with GI medicine to inform them of the patient's symptoms so they may see the patient in consultation with the hospitalist.  Patient will be admitted to the hospital service for further work-up and treatment.  Chemistry shows no concerning finding except mild hyperglycemia.  Patient being transfused 2 units.  I spoke to Dr. Mia Creek of GI medicine who will consult on the patient.  We will speak to the hospitalist regarding admission.  CRITICAL CARE Performed by: Caryn Bee  Carleah Yablonski   Total critical care time: 30 minutes  Critical care time was exclusive of separately billable procedures and treating other patients.  Critical care was necessary to treat or prevent imminent or life-threatening deterioration.  Critical care was time spent personally by me on the following activities: development of treatment plan with patient and/or surrogate as well as nursing, discussions with consultants, evaluation of patient's response to treatment, examination of patient, obtaining history from patient or surrogate, ordering and performing treatments and interventions, ordering and review of laboratory studies, ordering and review of radiographic studies, pulse oximetry and re-evaluation of patient's condition.   FINAL CLINICAL IMPRESSION(S) / ED DIAGNOSES   GI  bleed   Note:  This document was prepared using Dragon voice recognition software and may include unintentional dictation errors.   Minna Antis, MD 10/27/21 2248

## 2021-10-27 NOTE — ED Triage Notes (Signed)
Pt comes with c/o rectal bleeding. Pt states this started today. Pt states dark red with clots present. Pt is form [address].   VSS. Pt takes daily aspirin. Pt  denies any pain.

## 2021-10-27 NOTE — H&P (Signed)
History and Physical    Susan [name] VQM:[id number] DOB: 1927-01-27 DOA: 10/27/2021  PCP: [name], MD  Patient coming from: home I have personally briefly reviewed patient's old medical records in [facility]  Chief Complaint: brbpr x 3 days  HPI: Susan [name] is a 88 y.o. female with medical history significant of  HTN, CVA , HLD , DMII, anemia, interim history of diverticular bleed 2/23 who presents to ED with 3 days of brbpr. Patient states she presents to ED today due to persistent symptoms and noted feelings of weakness today. She notes no associated n/v/or abdominal pain. She notes no nsaid use or AC but is on a baby asa. She denies sob/ chestpain/f/c/dysuria. She states at home she would would brbpr /clots almost every 4 hours.  Of note while she had been in ED she had not had any further episodes.   ED Course:  Afeb, bp 142/57, hr 96, rr 16 sat 98% Wbc:7, hgb 6.4 down from 9.7, plt 193  Na : 135, K 3.9, glu 307, cr 0.9 Alkphos 33 Tx transfuse 2 prbc in ed  Review of Systems: As per HPI otherwise 10 point review of systems negative.   Past Medical History:  Diagnosis Date   Anemia    Balance problem    Diabetes mellitus without complication (HCC)    Diverticulosis    Heart murmur    Hypercholesteremia    Hypertension    Stroke [facility])    TIA 2006   TIA (transient ischemic attack) 2006    Past Surgical History:  Procedure Laterality Date   CATARACT EXTRACTION W/PHACO Left 05/22/2015   Procedure: CATARACT EXTRACTION PHACO AND INTRAOCULAR LENS PLACEMENT (IOC);  Surgeon: Galen Manila, MD;  Location: [facility] ORS;  Service: Ophthalmology;  Laterality: Left;  [place]: 01:07.9 AP%: 23.0 CDE: 15.62 Lot# [id number]   CATARACT EXTRACTION W/PHACO Right 06/12/2015   Procedure: CATARACT EXTRACTION PHACO AND INTRAOCULAR LENS PLACEMENT (IOC);  Surgeon: Galen Manila, MD;  Location: [facility] ORS;  Service: Ophthalmology;  Laterality: Right;  [place] 00:57 AP% 17.1 CDE 9.86 fluid pack lot #  [id number]   COLON SURGERY     resection   ECTOPIC PREGNANCY SURGERY     HERNIA REPAIR     TONSILLECTOMY       reports that she has quit smoking. She has quit using smokeless tobacco. She reports that she does not drink alcohol. No history on file for drug use.  Allergies  Allergen Reactions   Atenolol Other (See Comments)    "affected my heart rate"   Naprosyn [Naproxen] Other (See Comments)    GI Bleed    History reviewed. No pertinent family history.  Prior to Admission medications   Medication Sig Start Date End Date Taking? Authorizing Provider  amLODipine (NORVASC) 10 MG tablet Take 10 mg by mouth daily before supper.   Yes [provider]  ascorbic acid (VITAMIN C) 500 MG tablet Take 500 mg by mouth every evening.   Yes [provider]  aspirin EC 81 MG tablet Take 81 mg by mouth daily. Swallow whole.   Yes [provider]  atorvastatin (LIPITOR) 20 MG tablet Take 20 mg by mouth daily after supper.   Yes [provider]  Calcium Carbonate-Vitamin D 600-400 MG-UNIT tablet Take 1 tablet by mouth 2 (two) times daily.   Yes [provider]  glipiZIDE (GLUCOTROL XL) 5 MG 24 hr tablet Take 5 mg by mouth 2 (two) times daily.    Yes  [provider]  hydrochlorothiazide (MICROZIDE) 12.5 MG capsule Take 12.5 mg by mouth daily. 04/11/21  Yes [provider]  latanoprost (XALATAN) 0.005 % ophthalmic solution 1 drop at bedtime. 03/17/21  Yes [provider]  lisinopril (ZESTRIL) 40 MG tablet Take 40 mg by mouth daily. 04/11/21  Yes [provider]  metFORMIN (GLUCOPHAGE) 500 MG tablet Take 500 mg by mouth 2 (two) times daily with a meal.    Yes [provider]  Multiple Vitamin (MULTIVITAMIN WITH MINERALS) TABS tablet Take 1 tablet by mouth daily.   Yes [provider]    Physical Exam: Vitals:   10/27/21 2138 10/27/21 2139 10/27/21 2143 10/27/21 2200  BP: (!) 146/68   (!) 141/61  Pulse:  85 99  86  Resp: (!) 35 13  16  Temp:      TempSrc:      SpO2: 96% 100%  100%  Weight:   52.2 kg   Height:   5\' 6"  (1.676 m)      Vitals:   10/27/21 2138 10/27/21 2139 10/27/21 2143 10/27/21 2200  BP: (!) 146/68   (!) 141/61  Pulse: 85 99  86  Resp: (!) 35 13  16  Temp:      TempSrc:      SpO2: 96% 100%  100%  Weight:   52.2 kg   Height:   5\' 6"  (1.676 m)   Constitutional: NAD, calm, comfortable Eyes: PERRL, lids and conjunctivae normal ENMT: Mucous membranes are moist. Posterior pharynx clear of any exudate or lesions.Normal dentition.  Neck: normal, supple, no masses, no thyromegaly Respiratory: clear to auscultation bilaterally, no wheezing, no crackles. Normal respiratory effort. No accessory muscle use.  Cardiovascular: Regular rate and rhythm, no murmurs / rubs / gallops. No extremity edema. 2+ pedal pulses. No carotid bruits.  Abdomen: no tenderness, no masses palpated. No hepatosplenomegaly. Bowel sounds positive.  Musculoskeletal: no clubbing / cyanosis. No joint deformity upper and lower extremities. Good ROM, no contractures. Normal muscle tone.  Skin: no rashes, lesions, ulcers. No induration Neurologic: CN 2-12 grossly intact. Sensation intact, DTR normal. Strength 5/5 in all 4.  Psychiatric: Normal judgment and insight. Alert and oriented x 3. Normal mood.    Labs on Admission: I have personally reviewed following labs and imaging studies  CBC: Recent Labs  Lab 10/27/21 1611  WBC 7.0  NEUTROABS 4.2  HGB 6.4*  HCT 20.8*  MCV 86.3  PLT 193   Basic Metabolic Panel: Recent Labs  Lab 10/27/21 1611  NA 135  K 3.9  CL 103  CO2 25  GLUCOSE 307*  BUN 25*  CREATININE 0.90  CALCIUM 8.9   GFR: Estimated Creatinine Clearance: 32.2 mL/min (by C-G formula based on SCr of 0.9 mg/dL). Liver Function Tests: Recent Labs  Lab 10/27/21 1611  AST 29  ALT 16  ALKPHOS 33*  BILITOT 0.4  PROT 6.3*  ALBUMIN 3.2*   No results for input(s): "LIPASE", "AMYLASE"  in the last 168 hours. No results for input(s): "AMMONIA" in the last 168 hours. Coagulation Profile: No results for input(s): "INR", "PROTIME" in the last 168 hours. Cardiac Enzymes: No results for input(s): "CKTOTAL", "CKMB", "CKMBINDEX", "TROPONINI" in the last 168 hours. BNP (last 3 results) No results for input(s): "PROBNP" in the last 8760 hours. HbA1C: No results for input(s): "HGBA1C" in the last 72 hours. CBG: No results for input(s): "GLUCAP" in the last 168 hours. Lipid Profile: No results for input(s): "CHOL", "HDL", "LDLCALC", "TRIG", "CHOLHDL", "LDLDIRECT"  in the last 72 hours. Thyroid Function Tests: No results for input(s): "TSH", "T4TOTAL", "FREET4", "T3FREE", "THYROIDAB" in the last 72 hours. Anemia Panel: No results for input(s): "VITAMINB12", "FOLATE", "FERRITIN", "TIBC", "IRON", "RETICCTPCT" in the last 72 hours. Urine analysis:    Component Value Date/Time   COLORURINE YELLOW (A) 05/29/2021 0047   APPEARANCEUR CLEAR (A) 05/29/2021 0047   APPEARANCEUR Clear 08/24/2012 1203   LABSPEC 1.015 05/29/2021 0047   LABSPEC 1.017 08/24/2012 1203   PHURINE 5.0 05/29/2021 0047   GLUCOSEU 50 (A) 05/29/2021 0047   GLUCOSEU 50 mg/dL 22/63/3354 5625   HGBUR SMALL (A) 05/29/2021 0047   BILIRUBINUR NEGATIVE 05/29/2021 0047   BILIRUBINUR Negative 08/24/2012 1203   KETONESUR NEGATIVE 05/29/2021 0047   PROTEINUR NEGATIVE 05/29/2021 0047   NITRITE NEGATIVE 05/29/2021 0047   LEUKOCYTESUR NEGATIVE 05/29/2021 0047   LEUKOCYTESUR Trace 08/24/2012 1203    Radiological Exams on Admission: No results found.  EKG: Independently reviewed. See above   Assessment/Plan  Lower GI bleed -presumed diverticular , due to prior history  -no AC or nsaid use, on low dose ASA - associated with symptomatic anemia  -hgb 6.4 down form 9.7  - case discussed with Dr Scharlene Gloss who will see patient in am  -hold asa -npo  -cycle h/h   Acute symptomatic Anemia of blood loss -anemia panel   -s/p 2 prbc in ED -cycle h/h transfuse for < 7   HTN -stable bp  -hold bp medications currently due to note blood loss   CVA -hold secondary ppx for now due to gi bleed    HLD -resume meds once tolerating po    DMII -q4h fs  - iss  -hold oral hypoglycemics    Right renal lesion  - Concern for malignancy.  Will need MRI abdomen/renal-nonemergent, can be done with PCP if willing to pursue this.   DVT prophylaxis: scd  Code Status: full Family Communication:  Disposition Plan: patient  expected to be admitted greater than 2 midnights  Consults called: [facility] Admission status: progressive care    Lurline Del MD [facility]  If 7PM-7AM, please contact night-coverage [email or web address] Password [email or web address]  10/27/2021, 11:00 PM

## 2021-10-28 DIAGNOSIS — D62 Acute posthemorrhagic anemia: Secondary | ICD-10-CM

## 2021-10-28 DIAGNOSIS — D696 Thrombocytopenia, unspecified: Secondary | ICD-10-CM

## 2021-10-28 DIAGNOSIS — K922 Gastrointestinal hemorrhage, unspecified: Secondary | ICD-10-CM | POA: Diagnosis not present

## 2021-10-28 LAB — CBG MONITORING, ED
Glucose-Capillary: 170 mg/dL — ABNORMAL HIGH (ref 70–99)
Glucose-Capillary: 94 mg/dL (ref 70–99)

## 2021-10-28 LAB — CBC
HCT: 27.8 % — ABNORMAL LOW (ref 36.0–46.0)
Hemoglobin: 9.1 g/dL — ABNORMAL LOW (ref 12.0–15.0)
MCH: 27.6 pg (ref 26.0–34.0)
MCHC: 32.7 g/dL (ref 30.0–36.0)
MCV: 84.2 fL (ref 80.0–100.0)
Platelets: 147 10*3/uL — ABNORMAL LOW (ref 150–400)
RBC: 3.3 MIL/uL — ABNORMAL LOW (ref 3.87–5.11)
RDW: 14.7 % (ref 11.5–15.5)
WBC: 6.7 10*3/uL (ref 4.0–10.5)
nRBC: 0 % (ref 0.0–0.2)

## 2021-10-28 LAB — BASIC METABOLIC PANEL
Anion gap: 4 — ABNORMAL LOW (ref 5–15)
BUN: 21 mg/dL (ref 8–23)
CO2: 26 mmol/L (ref 22–32)
Calcium: 8.5 mg/dL — ABNORMAL LOW (ref 8.9–10.3)
Chloride: 109 mmol/L (ref 98–111)
Creatinine, Ser: 0.68 mg/dL (ref 0.44–1.00)
GFR, Estimated: >60 mL/min (ref >60)
Glucose, Bld: 198 mg/dL — ABNORMAL HIGH (ref 70–99)
Potassium: 3.7 mmol/L (ref 3.5–5.1)
Sodium: 139 mmol/L (ref 135–145)

## 2021-10-28 LAB — HEMOGLOBIN AND HEMATOCRIT, BLOOD
HCT: 21.3 % — ABNORMAL LOW (ref 36.0–46.0)
Hemoglobin: 7.3 g/dL — ABNORMAL LOW (ref 12.0–15.0)

## 2021-10-28 LAB — GLUCOSE, CAPILLARY
Glucose-Capillary: 108 mg/dL — ABNORMAL HIGH (ref 70–99)
Glucose-Capillary: 230 mg/dL — ABNORMAL HIGH (ref 70–99)

## 2021-10-28 LAB — HEMOGLOBIN: Hemoglobin: 9.1 g/dL — ABNORMAL LOW (ref 12.0–15.0)

## 2021-10-28 MED ORDER — OXYCODONE-ACETAMINOPHEN 5-325 MG PO TABS: 1.0000 | ORAL_TABLET | Freq: Four times a day (QID) | ORAL | Status: DC | PRN

## 2021-10-28 MED ORDER — MORPHINE SULFATE (PF) 2 MG/ML IV SOLN: 1.0000 mg | INTRAVENOUS | Status: DC | PRN

## 2021-10-28 MED ORDER — PEG 3350-KCL-NA BICARB-NACL 420 G PO SOLR
4000.0000 mL | Freq: Once | ORAL | Status: AC
  Administered 2021-10-28: 4000 mL via ORAL
  Filled 2021-10-28: qty 4000

## 2021-10-28 MED ORDER — PANTOPRAZOLE SODIUM 40 MG IV SOLR: 40.0000 mg | INTRAVENOUS | Status: DC

## 2021-10-28 NOTE — ED Notes (Signed)
Report to [employer] [employer], rn

## 2021-10-28 NOTE — Progress Notes (Signed)
she got here about 15 min ago and had 2 large bloody/clots BM, looks like no stool only blood.  Drucilla Chalet, MD notified. Will continue monitor her.

## 2021-10-28 NOTE — ED Notes (Signed)
Pt to room 37 by April, RN. Care assumed by this RN. Pt with noted blood transfusion at a rate of 176mL/Hr. Pt at baseline from previous interactions with patient. Pt calm cooperative and pleasant on assessment. Call bell within reach of patient at this time. Pt denies further needs.

## 2021-10-28 NOTE — ED Notes (Signed)
Pt assisted to the bedside commode, pt had large amounts of frank bloody stool with clots noted. Pt tolerated well. Pt assisted back to bed at this time. Pt denies further needs at this time. Call bell within reach of patient at this time.

## 2021-10-28 NOTE — ED Notes (Signed)
Pt assisted to [facility] at this time. Pt with noted frank bloody stool at this time. Pt back to bed without incident. Pt tolerated well.

## 2021-10-28 NOTE — Consult Note (Signed)
Consultation  Referring Provider:     Dr Maisie Fus Admit date 10/27/21 Consult date        7/04/20/21 Reason for Consultation:     Lower GIB         HPI:   Susan [name] is a 88 y.o. female with medical history of  HTN, CVA , HLD , DMII, anemia, s/pdiverticular bleed 2/23 who came to ED yesterday with rectal bleeding x 3d and weakness. Reported she was passing red material with clots without abdominal pain and further symptoms. Hemoglobin was 6.4. She was transfused 2u PRBC and admitted. Hemoglobin this am was 9.1. it has been noted she has had no further rectal bleeding since admission.  She had a CT scan 2/16 of the a/p which demonstrated  the following IMPRESSION: 1. Colonic diverticulosis with no acute diverticulitis. 2. Indeterminate left 2.2 cm right renal lesion. Findings concerning for a malignancy. Recommend nonemergent MRI renal protocol for further evaluation. When the patient is clinically stable and able to follow directions and hold their breath (preferably as an outpatient) further evaluation with dedicated abdominal MRI should be considered. 3.  Aortic Atherosclerosis (ICD10-I70.0) - severe. 4. Small fat containing umbilical hernia.  Patient states she is well. States this episode of rectal bleeding is similar to the event she had 2/23 but lasted longer. Denies abdominal pain, NVD, upper Gi symptoms and further gi related concerns.  States she is not really constipated and has a fibrous diet. States she is mostly w/c bound due to dizziness/weakness/unsteady gait and does not do any heavy lifting. States she had partial colectomy for diverticular issue years ago. Last colonoscopy 2014- unable to remember results. Daughter had colon polyps. Do note she was consulted by Dr Tobi Bastos 05/29/21 when she had her last GIB event and colonoscopy was discussed- it was noted that she was not interested. We discussed colonoscopy again today and she is unwilling to do this presently, but might consider it  if she has another bleeding event.  PREVIOUS ENDOSCOPIES:             Colonoscopy 2014- [facility]/results not available  Past Medical History:  Diagnosis Date   Anemia    Balance problem    Diabetes mellitus without complication (HCC)    Diverticulosis    Heart murmur    Hypercholesteremia    Hypertension    Stroke [facility])    TIA 2006   TIA (transient ischemic attack) 2006    Past Surgical History:  Procedure Laterality Date   CATARACT EXTRACTION W/PHACO Left 05/22/2015   Procedure: CATARACT EXTRACTION PHACO AND INTRAOCULAR LENS PLACEMENT (IOC);  Surgeon: Galen Manila, MD;  Location: [facility] ORS;  Service: Ophthalmology;  Laterality: Left;  [place]: 01:07.9 AP%: 23.0 CDE: 15.62 Lot# [id number]   CATARACT EXTRACTION W/PHACO Right 06/12/2015   Procedure: CATARACT EXTRACTION PHACO AND INTRAOCULAR LENS PLACEMENT (IOC);  Surgeon: Galen Manila, MD;  Location: [facility] ORS;  Service: Ophthalmology;  Laterality: Right;  [place] 00:57 AP% 17.1 CDE 9.86 fluid pack lot # [id number]   COLON SURGERY     resection   ECTOPIC PREGNANCY SURGERY     HERNIA REPAIR     TONSILLECTOMY      History reviewed. No pertinent family history.   Social History   Tobacco Use   Smoking status: Former   Smokeless tobacco: Former  Building services engineer Use: Never used  Substance Use Topics   Alcohol use: No    Prior to Admission medications   Medication [employer]  Date End Date Taking? Authorizing Provider  amLODipine (NORVASC) 10 MG tablet Take 10 mg by mouth daily before supper.   Yes [provider]  ascorbic acid (VITAMIN C) 500 MG tablet Take 500 mg by mouth every evening.   Yes [provider]  aspirin EC 81 MG tablet Take 81 mg by mouth daily. Swallow whole.   Yes [provider]  atorvastatin (LIPITOR) 20 MG tablet Take 20 mg by mouth daily after supper.   Yes [provider]  Calcium Carbonate-Vitamin D 600-400 MG-UNIT tablet Take 1 tablet by mouth 2 (two) times daily.    Yes [provider]  glipiZIDE (GLUCOTROL XL) 5 MG 24 hr tablet Take 5 mg by mouth 2 (two) times daily.    Yes [provider]  hydrochlorothiazide (MICROZIDE) 12.5 MG capsule Take 12.5 mg by mouth daily. 04/11/21  Yes [provider]  latanoprost (XALATAN) 0.005 % ophthalmic solution 1 drop at bedtime. 03/17/21  Yes [provider]  lisinopril (ZESTRIL) 40 MG tablet Take 40 mg by mouth daily. 04/11/21  Yes [provider]  metFORMIN (GLUCOPHAGE) 500 MG tablet Take 500 mg by mouth 2 (two) times daily with a meal.    Yes [provider]  Multiple Vitamin (MULTIVITAMIN WITH MINERALS) TABS tablet Take 1 tablet by mouth daily.   Yes [provider]    Current Facility-Administered Medications  Medication Dose Route Frequency Provider Last Rate Last Admin   0.9 %  sodium chloride infusion   Intravenous Continuous Lurline Del, MD 50 mL/hr at 10/28/21 0521 New Bag at 10/28/21 0521   acetaminophen (TYLENOL) tablet 650 mg  650 mg Oral Q6H PRN Lurline Del, MD       Or   acetaminophen (TYLENOL) suppository 650 mg  650 mg Rectal Q6H PRN Lurline Del, MD       albuterol (PROVENTIL) (2.5 MG/3ML) 0.083% nebulizer solution 2.5 mg  2.5 mg Nebulization Q2H PRN Skip Mayer A, MD       insulin aspart (novoLOG) injection 0-6 Units  0-6 Units Subcutaneous TID WC Skip Mayer A, MD   1 Units at 10/28/21 0830   ondansetron (ZOFRAN) tablet 4 mg  4 mg Oral Q6H PRN Lurline Del, MD       Or   ondansetron [facility]) injection 4 mg  4 mg Intravenous Q6H PRN Lurline Del, MD       pantoprazole (PROTONIX) injection 40 mg  40 mg Intravenous Q12H Skip Mayer A, MD   40 mg at 10/28/21 0002   Current Outpatient Medications  Medication Sig Dispense Refill   amLODipine (NORVASC) 10 MG tablet Take 10 mg by mouth daily before supper.     ascorbic acid (VITAMIN C) 500 MG tablet Take 500 mg by mouth every evening.      aspirin EC 81 MG tablet Take 81 mg by mouth daily. Swallow whole.     atorvastatin (LIPITOR) 20 MG tablet Take 20 mg by mouth daily after supper.     Calcium Carbonate-Vitamin D 600-400 MG-UNIT tablet Take 1 tablet by mouth 2 (two) times daily.     glipiZIDE (GLUCOTROL XL) 5 MG 24 hr tablet Take 5 mg by mouth 2 (two) times daily.      hydrochlorothiazide (MICROZIDE) 12.5 MG capsule Take 12.5 mg by mouth daily.     latanoprost (XALATAN) 0.005 % ophthalmic solution 1 drop at bedtime.     lisinopril (ZESTRIL) 40 MG tablet Take 40 mg by mouth  daily.     metFORMIN (GLUCOPHAGE) 500 MG tablet Take 500 mg by mouth 2 (two) times daily with a meal.      Multiple Vitamin (MULTIVITAMIN WITH MINERALS) TABS tablet Take 1 tablet by mouth daily.      Allergies as of 10/27/2021 - Review Complete 10/27/2021  Allergen Reaction Noted   Atenolol Other (See Comments) 05/18/2015   Naprosyn [naproxen] Other (See Comments) 05/18/2015     Review of Systems:    All systems reviewed and negative except where noted in HPI.     Physical Exam:  Vital signs in last 24 hours: Temp:  [97.5 F (36.4 C)-98.4 F (36.9 C)] 97.5 F (36.4 C) (07/17 0515) Pulse Rate:  [69-99] 70 (07/17 0630) Resp:  [13-35] 15 (07/17 0630) BP: (127-149)/(56-77) 131/77 (07/17 0630) SpO2:  [96 %-100 %] 100 % (07/17 0630) Weight:  [52.2 kg] 52.2 kg (07/16 2143)   General:   Pleasant elderly woman in NAD Head:  Normocephalic and atraumatic. Eyes:   No icterus.   Conjunctiva pink. Ears:  Normal auditory acuity. Mouth: Mucosa pink moist, no lesions. Neck:  Supple; no masses felt Lungs:  Respirations even and unlabored. Lungs clear to auscultation bilaterally.   No wheezes, crackles, or rhonchi.  Heart:  S1S2, RRR, no MRG. No edema. Abdomen:   Flat, soft, nondistended, nontender. Normal bowel sounds. No appreciable masses or hepatomegaly. No rebound signs or other peritoneal signs. Note there is small amount of brown stool in bedside  commode with some pink material around it.  Rectal:  Not performed.  Msk:  MAEW x4, No clubbing or cyanosis. Strength 5/5. Symmetrical without gross deformities. Neurologic:  Alert and  oriented x4;  Cranial nerves II-XII intact.  Skin:  Warm, dry, pink without significant lesions or rashes. Psych:  Alert and cooperative. Normal affect.  LAB RESULTS: Recent Labs    10/27/21 1611 10/28/21 0523  WBC 7.0 6.7  HGB 6.4* 9.1*  HCT 20.8* 27.8*  PLT 193 147*   BMET Recent Labs    10/27/21 1611 10/28/21 0523  NA 135 139  K 3.9 3.7  CL 103 109  CO2 25 26  GLUCOSE 307* 198*  BUN 25* 21  CREATININE 0.90 0.68  CALCIUM 8.9 8.5*   LFT Recent Labs    10/27/21 1611  PROT 6.3*  ALBUMIN 3.2*  AST 29  ALT 16  ALKPHOS 33*  BILITOT 0.4   PT/INR No results for input(s): "LABPROT", "INR" in the last 72 hours.  STUDIES: No results found.     Impression / Plan:   Hematochezia- improving- likely diverticular however ddx is broader. Patient is not wanting colonoscopy presently. If significant bleeding returns would recommend GIB scan for now.  Thank you very much for this consult. These services were provided by Vevelyn Pat, NP-C, in collaboration with Regis Bill, MD, with whom I have discussed this patient in full.   Vevelyn Pat, NP-C   Addendum- received notification from patient's nurse that she had another bloody stool- her hgb is stable at 9.1 from this am and she is hemodynamically stable- Dr Mia Creek and I spoke with patient again, discussed possible etiologies- diverticular, anal outlet, malignancy, other. Colonoscopy again discussed. She is unsure she wants to proceed but is agreeable to do the bowel prep- states she will likely agree if she has further significant rectal bleeding. Do also recommend CTA overnight if she has significant GI bleeding issue.

## 2021-10-28 NOTE — ED Notes (Signed)
Additional warm blankets provided

## 2021-10-28 NOTE — ED Notes (Signed)
Informed RN bed assigned 

## 2021-10-28 NOTE — Progress Notes (Signed)
PROGRESS NOTE    Susan [name]  MVH:[id number] DOB: 1926/10/31 DOA: 10/27/2021 PCP: [name], MD    Assessment & Plan:   Principal Problem:   Lower GI bleed  Assessment and Plan: Lower GI bleed: etiology unclear, possible diverticular as pt has a previous hx of this. NPO. No NSAIDs use. Hold aspirin. S/p 2 units of pRBCs transfused. GI consulted   Acute blood loss anemia: likely secondary to above. S/p 2 units of pRBCs transfused. Continue to monitor H&H    HTN: holding BP meds secondary to likely GI bleed    Hx of CVA: holding aspirin    HLD: restart statin when no longer NPO   DM2: likely well controlled. Continue on SSI w/ accuchecks  Right renal lesion: concern for malignancy.  Will need MRI abdomen/renal, nonemergent, can be done with PCP if willing to pursue this.   Thrombocytopenia: etiology unclear. Will continue to monitor   Wheelchair bound: has not walked in 2 years as per pt.      DVT prophylaxis: SCDs Code Status: full  Family Communication:  Disposition Plan: likely d/c back home   Level of care: Progressive  Status is: Inpatient Remains inpatient appropriate because: severity of illness    Consultants:  GI   Procedures:   Antimicrobials:   Subjective: Pt c/o bloody stools w/ clots   Objective: Vitals:   10/28/21 0349 10/28/21 0515 10/28/21 0600 10/28/21 0630  BP: 127/74 133/66 129/64 131/77  Pulse: 75 69 75 70  Resp: 20 (!) 23 (!) 22 15  Temp: 97.6 F (36.4 C) (!) 97.5 F (36.4 C)    TempSrc: Oral Oral    SpO2: 100% 100% 100% 100%  Weight:      Height:        Intake/Output Summary (Last 24 hours) at 10/28/2021 0810 Last data filed at 10/27/2021 2356 Gross per 24 hour  Intake 10 ml  Output --  Net 10 ml   Filed Weights   10/27/21 2143  Weight: 52.2 kg    Examination:  General exam: Appears calm and comfortable. Frail appearing  Respiratory system: Clear to auscultation. Respiratory effort normal. Cardiovascular  system: S1 & S2+. No rubs, gallops or clicks.  Gastrointestinal system: Abdomen is nondistended, soft and nontender. Hyperactive bowel sounds heard. Central nervous system: Alert and oriented. Moves all extremities  Psychiatry: Judgement and insight appear normal. Mood & affect appropriate.     Data Reviewed: I have personally reviewed following labs and imaging studies  CBC: Recent Labs  Lab 10/27/21 1611 10/28/21 0523  WBC 7.0 6.7  NEUTROABS 4.2  --   HGB 6.4* 9.1*  HCT 20.8* 27.8*  MCV 86.3 84.2  PLT 193 147*   Basic Metabolic Panel: Recent Labs  Lab 10/27/21 1611 10/28/21 0523  NA 135 139  K 3.9 3.7  CL 103 109  CO2 25 26  GLUCOSE 307* 198*  BUN 25* 21  CREATININE 0.90 0.68  CALCIUM 8.9 8.5*   GFR: Estimated Creatinine Clearance: 36.2 mL/min (by C-G formula based on SCr of 0.68 mg/dL). Liver Function Tests: Recent Labs  Lab 10/27/21 1611  AST 29  ALT 16  ALKPHOS 33*  BILITOT 0.4  PROT 6.3*  ALBUMIN 3.2*   No results for input(s): "LIPASE", "AMYLASE" in the last 168 hours. No results for input(s): "AMMONIA" in the last 168 hours. Coagulation Profile: No results for input(s): "INR", "PROTIME" in the last 168 hours. Cardiac Enzymes: No results for input(s): "CKTOTAL", "CKMB", "CKMBINDEX", "TROPONINI" in the  last 168 hours. BNP (last 3 results) No results for input(s): "PROBNP" in the last 8760 hours. HbA1C: No results for input(s): "HGBA1C" in the last 72 hours. CBG: Recent Labs  Lab 10/28/21 0736  GLUCAP 170*   Lipid Profile: No results for input(s): "CHOL", "HDL", "LDLCALC", "TRIG", "CHOLHDL", "LDLDIRECT" in the last 72 hours. Thyroid Function Tests: No results for input(s): "TSH", "T4TOTAL", "FREET4", "T3FREE", "THYROIDAB" in the last 72 hours. Anemia Panel: No results for input(s): "VITAMINB12", "FOLATE", "FERRITIN", "TIBC", "IRON", "RETICCTPCT" in the last 72 hours. Sepsis Labs: No results for input(s): "PROCALCITON", "LATICACIDVEN" in the  last 168 hours.  No results found for this or any previous visit (from the past 240 hour(s)).       Radiology Studies: No results found.      Scheduled Meds:  insulin aspart  0-6 Units Subcutaneous TID WC   pantoprazole (PROTONIX) IV  40 mg Intravenous Q12H   Continuous Infusions:  sodium chloride 50 mL/hr at 10/28/21 0521     LOS: 1 day    Time spent: 35 mins    Charise Killian, MD [facility] Pager [phone]  If 7PM-7AM, please contact night-coverage [email or web address] 10/28/2021, 8:10 AM

## 2021-10-28 NOTE — Progress Notes (Signed)
Admission profile updated. ?

## 2021-10-29 ENCOUNTER — Encounter
Admission: EM | Disposition: A | Discharge status: Home / Self Care | Payer: Self-pay | Source: Home / Self Care | Attending: Internal Medicine

## 2021-10-29 ENCOUNTER — Other Ambulatory Visit: Payer: Self-pay

## 2021-10-29 ENCOUNTER — Inpatient Hospital Stay: Payer: Medicare Other | Admitting: Anesthesiology

## 2021-10-29 ENCOUNTER — Encounter: Payer: Self-pay | Admitting: Internal Medicine

## 2021-10-29 DIAGNOSIS — E119 Type 2 diabetes mellitus without complications: Secondary | ICD-10-CM

## 2021-10-29 DIAGNOSIS — K922 Gastrointestinal hemorrhage, unspecified: Secondary | ICD-10-CM | POA: Diagnosis not present

## 2021-10-29 DIAGNOSIS — D62 Acute posthemorrhagic anemia: Secondary | ICD-10-CM | POA: Diagnosis not present

## 2021-10-29 HISTORY — PX: COLONOSCOPY WITH PROPOFOL: SHX5780

## 2021-10-29 LAB — BASIC METABOLIC PANEL
Anion gap: 4 — ABNORMAL LOW (ref 5–15)
BUN: 11 mg/dL (ref 8–23)
CO2: 24 mmol/L (ref 22–32)
Calcium: 8.2 mg/dL — ABNORMAL LOW (ref 8.9–10.3)
Chloride: 113 mmol/L — ABNORMAL HIGH (ref 98–111)
Creatinine, Ser: 0.62 mg/dL (ref 0.44–1.00)
GFR, Estimated: >60 mL/min (ref >60)
Glucose, Bld: 161 mg/dL — ABNORMAL HIGH (ref 70–99)
Potassium: 3.8 mmol/L (ref 3.5–5.1)
Sodium: 141 mmol/L (ref 135–145)

## 2021-10-29 LAB — BPAM RBC
Blood Product Expiration Date: 202308052359
Blood Product Expiration Date: 202308142359
ISSUE DATE / TIME: 202307162352
ISSUE DATE / TIME: 202307170323
Unit Type and Rh: 5100
Unit Type and Rh: 5100

## 2021-10-29 LAB — CBC
HCT: 26.7 % — ABNORMAL LOW (ref 36.0–46.0)
Hemoglobin: 8.8 g/dL — ABNORMAL LOW (ref 12.0–15.0)
MCH: 27.7 pg (ref 26.0–34.0)
MCHC: 33 g/dL (ref 30.0–36.0)
MCV: 84 fL (ref 80.0–100.0)
Platelets: 166 10*3/uL (ref 150–400)
RBC: 3.18 MIL/uL — ABNORMAL LOW (ref 3.87–5.11)
RDW: 15.5 % (ref 11.5–15.5)
WBC: 7.9 10*3/uL (ref 4.0–10.5)
nRBC: 0 % (ref 0.0–0.2)

## 2021-10-29 LAB — TYPE AND SCREEN
ABO/RH(D): O POS
Antibody Screen: NEGATIVE
Unit division: 0
Unit division: 0

## 2021-10-29 LAB — GLUCOSE, CAPILLARY
Glucose-Capillary: 189 mg/dL — ABNORMAL HIGH (ref 70–99)
Glucose-Capillary: 89 mg/dL (ref 70–99)
Glucose-Capillary: 122 mg/dL — ABNORMAL HIGH (ref 70–99)
Glucose-Capillary: 303 mg/dL — ABNORMAL HIGH (ref 70–99)

## 2021-10-29 LAB — HEMOGLOBIN AND HEMATOCRIT, BLOOD
HCT: 25.2 % — ABNORMAL LOW (ref 36.0–46.0)
Hemoglobin: 8.3 g/dL — ABNORMAL LOW (ref 12.0–15.0)

## 2021-10-29 SURGERY — COLONOSCOPY WITH PROPOFOL
Anesthesia: General

## 2021-10-29 MED ORDER — SODIUM CHLORIDE 0.9 % IV SOLN: INTRAVENOUS | Status: DC

## 2021-10-29 MED ORDER — HYDRALAZINE HCL 20 MG/ML IJ SOLN
INTRAMUSCULAR | Status: AC
  Filled 2021-10-29: qty 1

## 2021-10-29 MED ORDER — PHENYLEPHRINE 80 MCG/ML (10ML) SYRINGE FOR IV PUSH (FOR BLOOD PRESSURE SUPPORT)
PREFILLED_SYRINGE | INTRAVENOUS | Status: DC | PRN
  Administered 2021-10-29: 160 ug via INTRAVENOUS
  Administered 2021-10-29: 120 ug via INTRAVENOUS

## 2021-10-29 MED ORDER — PROPOFOL 500 MG/50ML IV EMUL
INTRAVENOUS | Status: DC | PRN
  Administered 2021-10-29: 100 ug/kg/min via INTRAVENOUS
  Administered 2021-10-29: 20 mg via INTRAVENOUS

## 2021-10-29 MED ORDER — PHENYLEPHRINE 80 MCG/ML (10ML) SYRINGE FOR IV PUSH (FOR BLOOD PRESSURE SUPPORT)
PREFILLED_SYRINGE | INTRAVENOUS | Status: AC
  Filled 2021-10-29: qty 10

## 2021-10-29 MED ORDER — PROPOFOL 10 MG/ML IV BOLUS
INTRAVENOUS | Status: AC
  Filled 2021-10-29: qty 20

## 2021-10-29 MED ORDER — HYDRALAZINE HCL 20 MG/ML IJ SOLN
10.0000 mg | Freq: Four times a day (QID) | INTRAMUSCULAR | Status: DC | PRN
  Administered 2021-10-29: 10 mg via INTRAVENOUS

## 2021-10-29 NOTE — Plan of Care (Signed)
  Problem: Education: Goal: Knowledge of General Education information will improve Description Including pain rating scale, medication(s)/side effects and non-pharmacologic comfort measures Outcome: Progressing   

## 2021-10-29 NOTE — Transfer of Care (Signed)
Immediate Anesthesia Transfer of Care Note  Patient: Susan [name]  Procedure(s) Performed: COLONOSCOPY WITH PROPOFOL  Patient Location: PACU  Anesthesia Type:General  Level of Consciousness: awake, alert  and oriented  Airway & Oxygen Therapy: Patient Spontanous Breathing and Patient connected to nasal cannula oxygen  Post-op Assessment: Report given to RN and Post -op Vital signs reviewed and stable  Post vital signs: Reviewed and stable  Last Vitals:  Vitals Value Taken Time  BP 117/49 10/29/21 1320  Temp 36.3 C 10/29/21 1316  Pulse 81 10/29/21 1325  Resp 17 10/29/21 1325  SpO2 100 % 10/29/21 1325  Vitals shown include unvalidated device data.  Last Pain:  Vitals:   10/29/21 1316  TempSrc: Temporal  PainSc: Asleep         Complications: No notable events documented.

## 2021-10-29 NOTE — Anesthesia Postprocedure Evaluation (Signed)
Anesthesia Post Note  Patient: Susan [name]  Procedure(s) Performed: COLONOSCOPY WITH PROPOFOL  Patient location during evaluation: Endoscopy Anesthesia Type: General Level of consciousness: awake and alert Pain management: pain level controlled Vital Signs Assessment: post-procedure vital signs reviewed and stable Respiratory status: spontaneous breathing, nonlabored ventilation, respiratory function stable and patient connected to nasal cannula oxygen Cardiovascular status: blood pressure returned to baseline and stable Postop Assessment: no apparent nausea or vomiting Anesthetic complications: no   No notable events documented.   Last Vitals:  Vitals:   10/29/21 1336 10/29/21 1436  BP: (!) 126/52 138/73  Pulse: 80 88  Resp: (!) 28 17  Temp:  36.6 C  SpO2: 100% 99%    Last Pain:  Vitals:   10/29/21 1336  TempSrc:   PainSc: 0-No pain                 Corinda Gubler

## 2021-10-29 NOTE — Op Note (Signed)
[facility] Patient Name: Susan [name] Procedure Date: 10/29/2021 1:00 PM MRN: [id number] Account #: [id number] Date of Birth: 1926-05-23 Admit Type: Inpatient Age: 88 Room: [facility] ENDO ROOM 1 Gender: Female Note Status: Finalized Instrument Name: Peds Colonoscope [id number] Procedure:             Colonoscopy Indications:           Hematochezia Providers:             Andrey Farmer MD, MD Referring MD:          No Local Md, MD (Referring MD) Medicines:             Monitored Anesthesia Care Complications:         No immediate complications. Procedure:             Pre-Anesthesia Assessment:                        - Prior to the procedure, a History and Physical was                         performed, and patient medications and allergies were                         reviewed. The patient is competent. The risks and                         benefits of the procedure and the sedation options and                         risks were discussed with the patient. All questions                         were answered and informed consent was obtained.                         Patient identification and proposed procedure were                         verified by the physician, the nurse, the                         anesthesiologist, the anesthetist and the technician                         in the endoscopy suite. Mental Status Examination:                         alert and oriented. Airway Examination: normal                         oropharyngeal airway and neck mobility. Respiratory                         Examination: clear to auscultation. CV Examination:                         normal. Prophylactic Antibiotics: The patient does not  require prophylactic antibiotics. Prior                         Anticoagulants: The patient has taken no previous                         anticoagulant or antiplatelet agents. ASA Grade                          Assessment: III - A patient with severe systemic                         disease. After reviewing the risks and benefits, the                         patient was deemed in satisfactory condition to                         undergo the procedure. The anesthesia plan was to use                         monitored anesthesia care (MAC). Immediately prior to                         administration of medications, the patient was                         re-assessed for adequacy to receive sedatives. The                         heart rate, respiratory rate, oxygen saturations,                         blood pressure, adequacy of pulmonary ventilation, and                         response to care were monitored throughout the                         procedure. The physical status of the patient was                         re-assessed after the procedure.                        After obtaining informed consent, the colonoscope was                         passed under direct vision. Throughout the procedure,                         the patient's blood pressure, pulse, and oxygen                         saturations were monitored continuously. The                         Colonoscope was introduced through the anus and  advanced to the the terminal ileum. The colonoscopy                         was performed without difficulty. The patient                         tolerated the procedure well. The quality of the bowel                         preparation was fair. Findings:      The perianal and digital rectal examinations were normal.      The terminal ileum appeared normal.      Many small and large-mouthed diverticula were found in the entire colon.      Internal hemorrhoids were found during retroflexion. The hemorrhoids       were Grade I (internal hemorrhoids that do not prolapse).      The exam was otherwise without abnormality on direct and retroflexion        views. Impression:            - Preparation of the colon was fair.                        - The examined portion of the ileum was normal.                        - Diverticulosis in the entire examined colon.                        - Internal hemorrhoids.                        - The examination was otherwise normal on direct and                         retroflexion views.                        - No specimens collected. Recommendation:        - Return patient to hospital ward for ongoing care.                        - Advance diet as tolerated.                        - Continue present medications.                        - This was likely a diverticular bleed that has now                         resolved. Recommend bowel regimen and avoiding NSAIDS. Procedure Code(s):     --- Professional ---                        [phone], Colonoscopy, flexible; diagnostic, including                         collection of specimen(s) by brushing or washing, when  performed (separate procedure) Diagnosis Code(s):     --- Professional ---                        K64.0, First degree hemorrhoids                        K92.1, Melena (includes Hematochezia)                        K57.30, Diverticulosis of large intestine without                         perforation or abscess without bleeding CPT copyright 2019 [employer]. All rights reserved. The codes documented in this report are preliminary and upon coder review may  be revised to meet current compliance requirements. Andrey Farmer MD, MD 10/29/2021 1:16:06 PM Number of Addenda: 0 Note Initiated On: 10/29/2021 1:00 PM Scope Withdrawal Time: 0 hours 3 minutes 28 seconds  Total Procedure Duration: 0 hours 5 minutes 17 seconds  Estimated Blood Loss:  Estimated blood loss: none.      [facility]

## 2021-10-29 NOTE — Care Plan (Signed)
Colonoscopy with severe diverticulosis but no active bleeding and no blood in terminal ileum or colon. Appears bleeding has resolved. Ok to advance diet. No further GI procedures planned. Please call with any questions or concerns.  Merlyn Lot MD, MPH [facility] GI

## 2021-10-29 NOTE — Progress Notes (Signed)
PROGRESS NOTE   HPI was taken from Dr. Maisie Fus: Susan [name] is a 88 y.o. female with medical history significant of  HTN, CVA , HLD , DMII, anemia, interim history of diverticular bleed 2/23 who presents to ED with 3 days of brbpr. Patient states she presents to ED today due to persistent symptoms and noted feelings of weakness today. She notes no associated n/v/or abdominal pain. She notes no nsaid use or AC but is on a baby asa. She denies sob/ chestpain/f/c/dysuria. She states at home she would would brbpr /clots almost every 4 hours.  Of note while she had been in ED she had not had any further episodes.    ED Course:  Afeb, bp 142/57, hr 96, rr 16 sat 98% Wbc:7, hgb 6.4 down from 9.7, plt 193  Na : 135, K 3.9, glu 307, cr 0.9 Alkphos 33 Tx transfuse 2 prbc in ed    Susan [name]  OEU:[id number] DOB: 10-12-1926 DOA: 10/27/2021 PCP: [name], MD    Assessment & Plan:   Principal Problem:   Lower GI bleed  Assessment and Plan: Lower GI bleed: etiology unclear, possible diverticular bleed as pt has prior hx of this. NPO. No NSAIDs use. Hold aspirin. S/p 2 units of pRBCs transfused. Completed bowel prep so will go for colonoscopy this afternoon as per GI   Acute blood loss anemia: likely secondary to above. S/p 2 units of pRBCs transfused. H&H are labile    HTN: holding BP meds secondary to likely GI bleed   Hx of CVA: continue to hold aspirin     HLD: restart statin when no longer NPO  DM2: likely well controlled. Continue on SSI w/ accuchecks   Right renal lesion: concern for malignancy.  Will need MRI abdomen/renal, nonemergent, can be done with PCP if willing to pursue this.   Thrombocytopenia: resolved  Wheelchair bound: has not walked in 2 years as per pt      DVT prophylaxis: SCDs Code Status: full  Family Communication:  Disposition Plan: likely d/c back home   Level of care: Progressive  Status is: Inpatient Remains inpatient appropriate because: severity  of illness, going for colonoscopy today    Consultants:  GI   Procedures:   Antimicrobials:   Subjective: Pt c/o bloody stools w/ the start of colon prep  Objective: Vitals:   10/29/21 0005 10/29/21 0141 10/29/21 0357 10/29/21 0739  BP: (!) 165/64 132/62 140/65 133/61  Pulse: 78 97 94 91  Resp:   18 18  Temp:   (!) 97.5 F (36.4 C) 97.6 F (36.4 C)  TempSrc:    Oral  SpO2: 98%  100% 100%  Weight:   52.3 kg   Height:        Intake/Output Summary (Last 24 hours) at 10/29/2021 0752 Last data filed at 10/29/2021 0500 Gross per 24 hour  Intake 4103.8 ml  Output 4 ml  Net 4099.8 ml   Filed Weights   10/27/21 2143 10/29/21 0357  Weight: 52.2 kg 52.3 kg    Examination:  General exam: Appears comfortable. Frail appearing  Respiratory system: clear breath sounds b/l  Cardiovascular system: S1/S2+. No rubs or gallops  Gastrointestinal system: Abd is soft, NT, ND & hyperactive bowel sounds Central nervous system: alert and oriented. Moves all extremities  Psychiatry: judgement and insight appears normal. Appropriate mood and affect     Data Reviewed: I have personally reviewed following labs and imaging studies  CBC: Recent Labs  Lab 10/27/21  1611 10/28/21 0523 10/28/21 1130 10/28/21 1923 10/29/21 0034 10/29/21 0359  WBC 7.0 6.7  --   --   --  7.9  NEUTROABS 4.2  --   --   --   --   --   HGB 6.4* 9.1* 9.1* 7.3* 8.3* 8.8*  HCT 20.8* 27.8*  --  21.3* 25.2* 26.7*  MCV 86.3 84.2  --   --   --  84.0  PLT 193 147*  --   --   --  166   Basic Metabolic Panel: Recent Labs  Lab 10/27/21 1611 10/28/21 0523 10/29/21 0359  NA 135 139 141  K 3.9 3.7 3.8  CL 103 109 113*  CO2 25 26 24   GLUCOSE 307* 198* 161*  BUN 25* 21 11  CREATININE 0.90 0.68 0.62  CALCIUM 8.9 8.5* 8.2*   GFR: Estimated Creatinine Clearance: 36.3 mL/min (by C-G formula based on SCr of 0.62 mg/dL). Liver Function Tests: Recent Labs  Lab 10/27/21 1611  AST 29  ALT 16  ALKPHOS 33*   BILITOT 0.4  PROT 6.3*  ALBUMIN 3.2*   No results for input(s): "LIPASE", "AMYLASE" in the last 168 hours. No results for input(s): "AMMONIA" in the last 168 hours. Coagulation Profile: No results for input(s): "INR", "PROTIME" in the last 168 hours. Cardiac Enzymes: No results for input(s): "CKTOTAL", "CKMB", "CKMBINDEX", "TROPONINI" in the last 168 hours. BNP (last 3 results) No results for input(s): "PROBNP" in the last 8760 hours. HbA1C: No results for input(s): "HGBA1C" in the last 72 hours. CBG: Recent Labs  Lab 10/28/21 0736 10/28/21 1159 10/28/21 1557 10/28/21 2018 10/29/21 0751  GLUCAP 170* 94 108* 230* 189*   Lipid Profile: No results for input(s): "CHOL", "HDL", "LDLCALC", "TRIG", "CHOLHDL", "LDLDIRECT" in the last 72 hours. Thyroid Function Tests: No results for input(s): "TSH", "T4TOTAL", "FREET4", "T3FREE", "THYROIDAB" in the last 72 hours. Anemia Panel: No results for input(s): "VITAMINB12", "FOLATE", "FERRITIN", "TIBC", "IRON", "RETICCTPCT" in the last 72 hours. Sepsis Labs: No results for input(s): "PROCALCITON", "LATICACIDVEN" in the last 168 hours.  No results found for this or any previous visit (from the past 240 hour(s)).       Radiology Studies: No results found.      Scheduled Meds:  insulin aspart  0-6 Units Subcutaneous TID WC   pantoprazole (PROTONIX) IV  40 mg Intravenous Q12H   Continuous Infusions:  sodium chloride 100 mL/hr at 10/29/21 0500     LOS: 2 days    Time spent: 35 mins    10/31/21, MD [facility] Pager [phone]  If 7PM-7AM, please contact night-coverage [email or web address] 10/29/2021, 7:52 AM

## 2021-10-29 NOTE — Anesthesia Preprocedure Evaluation (Addendum)
Anesthesia Evaluation  Patient identified by MRN, date of birth, ID band Patient awake    Reviewed: Allergy & Precautions, NPO status , Patient's Chart, lab work & pertinent test results  History of Anesthesia Complications Negative for: history of anesthetic complications  Airway Mallampati: II  TM Distance: >3 FB Neck ROM: full    Dental  (+) Edentulous Lower, Edentulous Upper   Pulmonary neg pulmonary ROS, former smoker,    Pulmonary exam normal breath sounds clear to auscultation       Cardiovascular Exercise Tolerance: Poor hypertension, Pt. on medications Normal cardiovascular exam Rhythm:Regular Rate:Normal     Neuro/Psych TIA (2006)CVA, No Residual Symptoms negative psych ROS   GI/Hepatic negative GI ROS, Neg liver ROS,   Endo/Other  diabetes, Type 2, Oral Hypoglycemic Agents  Renal/GU Right renal lesion  negative genitourinary   Musculoskeletal   Abdominal   Peds  Hematology  (+) Blood dyscrasia, anemia , thrombocytopenia   Anesthesia Other Findings interim history of diverticular bleed 2/23 who presents to ED with 3 days of brbpr. S/p 2 units of pRBCs transfused. Hgb 8.8 at 4am.  Wheelchair bound: has not walked in 2 years as per pt  Past Medical History: No date: Anemia No date: Balance problem No date: Diabetes mellitus without complication (HCC) No date: Diverticulosis No date: Heart murmur No date: Hypercholesteremia No date: Hypertension No date: Stroke [facility])     Comment:  TIA 2006 2006: TIA (transient ischemic attack)  Past Surgical History: 05/22/2015: CATARACT EXTRACTION W/PHACO; Left     Comment:  Procedure: CATARACT EXTRACTION PHACO AND INTRAOCULAR               LENS PLACEMENT (IOC);  Surgeon: Galen Manila, MD;                Location: [facility] ORS;  Service: Ophthalmology;  Laterality:              Left;  [place]: 01:07.9 AP%: 23.0 CDE: 15.62 Lot# [id number] 06/12/2015: CATARACT  EXTRACTION W/PHACO; Right     Comment:  Procedure: CATARACT EXTRACTION PHACO AND INTRAOCULAR               LENS PLACEMENT (IOC);  Surgeon: Galen Manila, MD;                Location: [facility] ORS;  Service: Ophthalmology;  Laterality:              Right;  [place] 00:57 AP% 17.1 CDE 9.86 fluid pack lot #               [id number] No date: COLON SURGERY     Comment:  resection No date: ECTOPIC PREGNANCY SURGERY No date: HERNIA REPAIR No date: TONSILLECTOMY  BMI    Body Mass Index: 18.61 kg/m      Reproductive/Obstetrics negative OB ROS                            Anesthesia Physical Anesthesia Plan  ASA: 3  Anesthesia Plan: General   Post-op Pain Management: Minimal or no pain anticipated   Induction: Intravenous  PONV Risk Score and Plan: 3 and Propofol infusion, TIVA and Treatment may vary due to age or medical condition  Airway Management Planned: Natural Airway  Additional Equipment: None  Intra-op Plan:   Post-operative Plan:   Informed Consent: I have reviewed the patients History and Physical, chart, labs and discussed the procedure including the risks, benefits and alternatives for  the proposed anesthesia with the patient or authorized representative who has indicated his/her understanding and acceptance.     Dental Advisory Given  Plan Discussed with: Anesthesiologist, CRNA and Surgeon  Anesthesia Plan Comments: (Discussed risks of anesthesia with patient, including possibility of difficulty with spontaneous ventilation under anesthesia necessitating airway intervention, PONV, post operative cognitive dysfunction, and rare risks such as cardiac or respiratory or neurological events, and allergic reactions. Discussed the role of CRNA in patient's perioperative care. Patient understands.)       Anesthesia Quick Evaluation

## 2021-10-30 ENCOUNTER — Encounter: Payer: Self-pay | Admitting: Internal Medicine

## 2021-10-30 DIAGNOSIS — K922 Gastrointestinal hemorrhage, unspecified: Secondary | ICD-10-CM | POA: Diagnosis not present

## 2021-10-30 DIAGNOSIS — D62 Acute posthemorrhagic anemia: Secondary | ICD-10-CM | POA: Diagnosis present

## 2021-10-30 DIAGNOSIS — N289 Disorder of kidney and ureter, unspecified: Secondary | ICD-10-CM | POA: Diagnosis not present

## 2021-10-30 LAB — CBC
HCT: 23.7 % — ABNORMAL LOW (ref 36.0–46.0)
Hemoglobin: 7.7 g/dL — ABNORMAL LOW (ref 12.0–15.0)
MCH: 27.9 pg (ref 26.0–34.0)
MCHC: 32.5 g/dL (ref 30.0–36.0)
MCV: 85.9 fL (ref 80.0–100.0)
Platelets: 185 10*3/uL (ref 150–400)
RBC: 2.76 MIL/uL — ABNORMAL LOW (ref 3.87–5.11)
RDW: 15.6 % — ABNORMAL HIGH (ref 11.5–15.5)
WBC: 6.4 10*3/uL (ref 4.0–10.5)
nRBC: 0 % (ref 0.0–0.2)

## 2021-10-30 LAB — IRON AND TIBC
Iron: 20 ug/dL — ABNORMAL LOW (ref 28–170)
Saturation Ratios: 6 % — ABNORMAL LOW (ref 10.4–31.8)
TIBC: 312 ug/dL (ref 250–450)
UIBC: 292 ug/dL

## 2021-10-30 LAB — URINALYSIS, ROUTINE W REFLEX MICROSCOPIC
Bacteria, UA: NONE SEEN
Bilirubin Urine: NEGATIVE
Glucose, UA: >=500 mg/dL — AB
Hgb urine dipstick: NEGATIVE
Ketones, ur: NEGATIVE mg/dL
Leukocytes,Ua: NEGATIVE
Nitrite: NEGATIVE
Protein, ur: NEGATIVE mg/dL
Specific Gravity, Urine: 1.016 (ref 1.005–1.030)
pH: 6 (ref 5.0–8.0)

## 2021-10-30 LAB — BASIC METABOLIC PANEL
Anion gap: <0 — ABNORMAL LOW (ref 5–15)
BUN: 10 mg/dL (ref 8–23)
CO2: 28 mmol/L (ref 22–32)
Calcium: 8.5 mg/dL — ABNORMAL LOW (ref 8.9–10.3)
Chloride: 118 mmol/L — ABNORMAL HIGH (ref 98–111)
Creatinine, Ser: 0.81 mg/dL (ref 0.44–1.00)
GFR, Estimated: >60 mL/min (ref >60)
Glucose, Bld: 194 mg/dL — ABNORMAL HIGH (ref 70–99)
Potassium: 4 mmol/L (ref 3.5–5.1)
Sodium: 143 mmol/L (ref 135–145)

## 2021-10-30 LAB — FERRITIN: Ferritin: 15 ng/mL (ref 11–307)

## 2021-10-30 LAB — HEMOGLOBIN AND HEMATOCRIT, BLOOD
HCT: 23.8 % — ABNORMAL LOW (ref 36.0–46.0)
Hemoglobin: 7.6 g/dL — ABNORMAL LOW (ref 12.0–15.0)

## 2021-10-30 LAB — GLUCOSE, CAPILLARY
Glucose-Capillary: 153 mg/dL — ABNORMAL HIGH (ref 70–99)
Glucose-Capillary: 362 mg/dL — ABNORMAL HIGH (ref 70–99)
Glucose-Capillary: 165 mg/dL — ABNORMAL HIGH (ref 70–99)
Glucose-Capillary: 89 mg/dL (ref 70–99)
Glucose-Capillary: 191 mg/dL — ABNORMAL HIGH (ref 70–99)

## 2021-10-30 MED ORDER — FERROUS SULFATE 325 (65 FE) MG PO TABS: 325.0000 mg | ORAL_TABLET | ORAL | 0 refills | Status: AC

## 2021-10-30 MED ORDER — SODIUM CHLORIDE 0.9 % IV SOLN
200.0000 mg | Freq: Once | INTRAVENOUS | Status: AC
  Administered 2021-10-30: 200 mg via INTRAVENOUS
  Filled 2021-10-30: qty 200

## 2021-10-30 NOTE — Care Management Important Message (Signed)
Important Message  Patient Details  Name: RHEALYNN MYHRE MRN: [id number] Date of Birth: 1926-10-05   Medicare Important Message Given:  Yes     Johnell Comings 10/30/2021, 11:29 AM

## 2021-10-30 NOTE — TOC CM/SW Note (Signed)
  Transition of Care [facility]) Screening Note   Patient Details  Name: Susan [name] Date of Birth: Sep 10, 1926   Transition of Care [facility]) CM/SW Contact:    Margarito Liner, LCSW Phone Number: 10/30/2021, 11:21 AM    Transition of Care Department [facility]) has reviewed patient and no TOC needs have been identified at this time. We will continue to monitor patient advancement through interdisciplinary progression rounds. If new patient transition needs arise, please place a TOC consult.

## 2021-10-30 NOTE — Progress Notes (Addendum)
Progress Note    Susan [name]  OAC:[id number] DOB: 1926-10-21  DOA: 10/27/2021 PCP: [name], MD      Brief Narrative:    Medical records reviewed and are as summarized below:  Susan [name] is a 88 y.o. female with medical history significant for hypertension, stroke, hyperlipidemia, type II DM, chronic anemia, history of diverticular bleed in February 2023, who presented to the hospital with a 3-day history of bright red bleeding per rectum.       Assessment/Plan:   Principal Problem:   Lower GI bleed Active Problems:   Renal lesion, right   Acute blood loss anemia   Body mass index is 18.01 kg/m.   Lower GI bleeding: S/p colonoscopy on 10/29/2021 which showed diverticulosis and internal hemorrhoids.  Acute blood loss anemia, chronic iron deficiency anemia: Iron level was 20, iron saturation 6 and ferritin level was 15.  Given IV iron sucrose infusion today and tomorrow.  S/p transfusion with 2 units of PRBCs since admission.  Generalized weakness: This is likely from anemia.  2.2 cm right renal lesion concerning for malignancy: Outpatient follow-up with MRI recommended.  Other comorbidities include hypertension, type II DM, history of stroke, advanced age  Patient does not want to be discharged today.  She prefers to be monitored overnight.   Diet Order             Diet - low sodium heart healthy           Diet Carb Modified Fluid consistency: Thin; Room service appropriate? Yes  Diet effective now                            Consultants: Gastroenterologist  Procedures: Colonoscopy on 10/29/2021    Medications:    insulin aspart  0-6 Units Subcutaneous TID WC   pantoprazole (PROTONIX) IV  40 mg Intravenous Q12H   Continuous Infusions:   Anti-infectives (From admission, onward)    None              Family Communication/Anticipated D/C date and plan/Code Status   DVT prophylaxis: Place and maintain sequential  compression device Start: 10/28/21 1331 SCDs Start: 10/27/21 2317     Code Status: Full Code  Family Communication: None Disposition Plan: Plan to discharge home tomorrow   Status is: Inpatient Remains inpatient appropriate because: Patient did not want to be discharged today.  Because of advanced age, she will be kept overnight.      Subjective:   Interval events noted.  She complains of generalized weakness.  No shortness of breath or chest pain.  No rectal bleeding.  Objective:    Vitals:   10/30/21 0538 10/30/21 0728 10/30/21 1214 10/30/21 1506  BP:  (!) 162/67 (!) 144/62 (!) 156/62  Pulse:  77 80 88  Resp:  17 16 17   Temp:  97.8 F (36.6 C) 98.3 F (36.8 C)   TempSrc:      SpO2:  97% 100% 100%  Weight: 50.6 kg     Height:       No data found.   Intake/Output Summary (Last 24 hours) at 10/30/2021 1637 Last data filed at 10/30/2021 1522 Gross per 24 hour  Intake 240 ml  Output 650 ml  Net -410 ml   Filed Weights   10/27/21 2143 10/29/21 0357 10/30/21 0538  Weight: 52.2 kg 52.3 kg 50.6 kg    Exam:  GEN: NAD SKIN: Warm and  dry EYES: Warm and dry ENT: MMM CV: RRR PULM: CTA B ABD: soft, ND, NT, +BS CNS: AAO x 3, non focal EXT: No edema or tenderness        Data Reviewed:   I have personally reviewed following labs and imaging studies:  Labs: Labs show the following:   Basic Metabolic Panel: Recent Labs  Lab 10/27/21 1611 10/28/21 0523 10/29/21 0359 10/30/21 0411  NA 135 139 141 143  K 3.9 3.7 3.8 4.0  CL 103 109 113* 118*  CO2 25 26 24 28   GLUCOSE 307* 198* 161* 194*  BUN 25* 21 11 10   CREATININE 0.90 0.68 0.62 0.81  CALCIUM 8.9 8.5* 8.2* 8.5*   GFR Estimated Creatinine Clearance: 34.7 mL/min (by C-G formula based on SCr of 0.81 mg/dL). Liver Function Tests: Recent Labs  Lab 10/27/21 1611  AST 29  ALT 16  ALKPHOS 33*  BILITOT 0.4  PROT 6.3*  ALBUMIN 3.2*   No results for input(s): "LIPASE", "AMYLASE" in the last 168  hours. No results for input(s): "AMMONIA" in the last 168 hours. Coagulation profile No results for input(s): "INR", "PROTIME" in the last 168 hours.  CBC: Recent Labs  Lab 10/27/21 1611 10/28/21 0523 10/28/21 1130 10/28/21 1923 10/29/21 0034 10/29/21 0359 10/30/21 0411 10/30/21 1134  WBC 7.0 6.7  --   --   --  7.9 6.4  --   NEUTROABS 4.2  --   --   --   --   --   --   --   HGB 6.4* 9.1*   < > 7.3* 8.3* 8.8* 7.7* 7.6*  HCT 20.8* 27.8*  --  21.3* 25.2* 26.7* 23.7* 23.8*  MCV 86.3 84.2  --   --   --  84.0 85.9  --   PLT 193 147*  --   --   --  166 185  --    < > = values in this interval not displayed.   Cardiac Enzymes: No results for input(s): "CKTOTAL", "CKMB", "CKMBINDEX", "TROPONINI" in the last 168 hours. BNP (last 3 results) No results for input(s): "PROBNP" in the last 8760 hours. CBG: Recent Labs  Lab 10/29/21 2033 10/30/21 0724 10/30/21 1212 10/30/21 1352 10/30/21 1535  GLUCAP 303* 153* 362* 165* 89   D-Dimer: No results for input(s): "DDIMER" in the last 72 hours. Hgb A1c: No results for input(s): "HGBA1C" in the last 72 hours. Lipid Profile: No results for input(s): "CHOL", "HDL", "LDLCALC", "TRIG", "CHOLHDL", "LDLDIRECT" in the last 72 hours. Thyroid function studies: No results for input(s): "TSH", "T4TOTAL", "T3FREE", "THYROIDAB" in the last 72 hours.  Invalid input(s): "FREET3" Anemia work up: Recent Labs    10/30/21 0411  FERRITIN 15  TIBC 312  IRON 20*   Sepsis Labs: Recent Labs  Lab 10/27/21 1611 10/28/21 0523 10/29/21 0359 10/30/21 0411  WBC 7.0 6.7 7.9 6.4    Microbiology No results found for this or any previous visit (from the past 240 hour(s)).  Procedures and diagnostic studies:  No results found.             LOS: 3 days   Judene Logue  [facility]   Pager on [email or web address]. If 7PM-7AM, please contact night-coverage at [email or web address]     10/30/2021, 4:37 PM

## 2021-10-30 NOTE — Plan of Care (Signed)
  Problem: Education: Goal: Knowledge of General Education information will improve Description Including pain rating scale, medication(s)/side effects and non-pharmacologic comfort measures Outcome: Progressing   

## 2021-10-31 DIAGNOSIS — N289 Disorder of kidney and ureter, unspecified: Secondary | ICD-10-CM | POA: Diagnosis not present

## 2021-10-31 DIAGNOSIS — D62 Acute posthemorrhagic anemia: Secondary | ICD-10-CM | POA: Diagnosis not present

## 2021-10-31 DIAGNOSIS — K922 Gastrointestinal hemorrhage, unspecified: Secondary | ICD-10-CM | POA: Diagnosis not present

## 2021-10-31 LAB — GLUCOSE, CAPILLARY
Glucose-Capillary: 158 mg/dL — ABNORMAL HIGH (ref 70–99)
Glucose-Capillary: 284 mg/dL — ABNORMAL HIGH (ref 70–99)

## 2021-10-31 MED ORDER — SODIUM CHLORIDE 0.9 % IV SOLN
200.0000 mg | Freq: Once | INTRAVENOUS | Status: AC
  Administered 2021-10-31: 200 mg via INTRAVENOUS
  Filled 2021-10-31: qty 200

## 2021-10-31 NOTE — Plan of Care (Signed)

## 2021-10-31 NOTE — Discharge Summary (Signed)
Physician Discharge Summary   Patient: Susan [name] MRN: [id number] DOB: Sep 20, 1926  Admit date:     10/27/2021  Discharge date: 10/31/2021  Discharge Physician: Lurene Shadow   PCP: [name], MD   Recommendations at discharge:   Follow-up with PCP in 1 week. Follow-up with PCP for further evaluation of right renal lesion if desired  Discharge Diagnoses: Principal Problem:   Lower GI bleed Active Problems:   Renal lesion, right   Acute blood loss anemia  Resolved Problems:   * No resolved hospital problems. [facility] Hospital Course:  Ms. MERCEDIES GANESH is a 88 y.o. female with medical history significant for hypertension, stroke, hyperlipidemia, type II DM, chronic anemia, history of diverticular bleed in February 2023, who presented to the hospital with a 3-day history of bright red bleeding per rectum.  Her hemoglobin was 6.4 on admission.  She was admitted to the hospital for acute lower GI bleeding complicated by acute blood loss anemia.  She was transfused 2 units of packed red blood cells and she was given 2 doses of IV iron sucrose for iron deficiency anemia.  He underwent colonoscopy which revealed diverticulosis and internal hemorrhoids.  No clear source of bleeding was found.  Her condition has improved and she is deemed stable for discharge to home today.         Consultants: Gastroenterologist Procedures performed: Colonoscopy Disposition: Home Diet recommendation:  Discharge Diet Orders (From admission, onward)     Start     Ordered   10/30/21 0000  Diet - low sodium heart healthy        10/30/21 1416           Cardiac diet DISCHARGE MEDICATION: Allergies as of 10/31/2021       Reactions   Atenolol Other (See Comments)   "affected my heart rate"   Naprosyn [naproxen] Other (See Comments)   GI Bleed        Medication List     TAKE these medications    amLODipine 10 MG tablet Commonly known as: NORVASC Take 10 mg by mouth daily before supper.    ascorbic acid 500 MG tablet Commonly known as: VITAMIN C Take 500 mg by mouth every evening.   aspirin EC 81 MG tablet Take 81 mg by mouth daily. Swallow whole.   atorvastatin 20 MG tablet Commonly known as: LIPITOR Take 20 mg by mouth daily after supper.   Calcium Carbonate-Vitamin D 600-400 MG-UNIT tablet Take 1 tablet by mouth 2 (two) times daily.   ferrous sulfate 325 (65 FE) MG tablet Take 1 tablet (325 mg total) by mouth every other day.   glipiZIDE 5 MG 24 hr tablet Commonly known as: GLUCOTROL XL Take 5 mg by mouth 2 (two) times daily.   hydrochlorothiazide 12.5 MG capsule Commonly known as: MICROZIDE Take 12.5 mg by mouth daily.   latanoprost 0.005 % ophthalmic solution Commonly known as: XALATAN 1 drop at bedtime.   lisinopril 40 MG tablet Commonly known as: ZESTRIL Take 40 mg by mouth daily.   metFORMIN 500 MG tablet Commonly known as: GLUCOPHAGE Take 500 mg by mouth 2 (two) times daily with a meal.   multivitamin with minerals Tabs tablet Take 1 tablet by mouth daily.        Discharge Exam: Filed Weights   10/29/21 0357 10/30/21 0538 10/31/21 0336  Weight: 52.3 kg 50.6 kg 53.3 kg   GEN: NAD SKIN: Warm and dry EYES: No pallor or icterus ENT: MMM CV:  RRR PULM: CTA B ABD: soft, ND, NT, +BS CNS: AAO x 3, non focal EXT: No edema or tenderness   Condition at discharge: good  The results of significant diagnostics from this hospitalization (including imaging, microbiology, ancillary and laboratory) are listed below for reference.   Imaging Studies: No results found.  Microbiology: Results for orders placed or performed during the hospital encounter of 05/28/21  Resp Panel by RT-PCR (Flu A&B, Covid) Nasopharyngeal Swab     Status: None   Collection Time: 05/28/21  7:32 PM   Specimen: Nasopharyngeal Swab; Nasopharyngeal(NP) swabs in vial transport medium  Result Value Ref Range Status   SARS Coronavirus 2 by RT PCR NEGATIVE NEGATIVE Final     Comment: (NOTE) SARS-CoV-2 target nucleic acids are NOT DETECTED.  The SARS-CoV-2 RNA is generally detectable in upper respiratory specimens during the acute phase of infection. The lowest concentration of SARS-CoV-2 viral copies this assay can detect is 138 copies/mL. A negative result does not preclude SARS-Cov-2 infection and should not be used as the sole basis for treatment or other patient management decisions. A negative result may occur with  improper specimen collection/handling, submission of specimen other than nasopharyngeal swab, presence of viral mutation(s) within the areas targeted by this assay, and inadequate number of viral copies(<138 copies/mL). A negative result must be combined with clinical observations, patient history, and epidemiological information. The expected result is Negative.  Fact Sheet for Patients:  BloggerCourse.com  Fact Sheet for Healthcare Providers:  SeriousBroker.it  This test is no t yet approved or cleared by the [place] FDA and  has been authorized for detection and/or diagnosis of SARS-CoV-2 by FDA under an Emergency Use Authorization (EUA). This EUA will remain  in effect (meaning this test can be used) for the duration of the COVID-19 declaration under Section 564(b)(1) of the Act, 21 U.S.C.section 360bbb-3(b)(1), unless the authorization is terminated  or revoked sooner.       Influenza A by PCR NEGATIVE NEGATIVE Final   Influenza B by PCR NEGATIVE NEGATIVE Final    Comment: (NOTE) The Xpert Xpress SARS-CoV-2/FLU/RSV plus assay is intended as an aid in the diagnosis of influenza from Nasopharyngeal swab specimens and should not be used as a sole basis for treatment. Nasal washings and aspirates are unacceptable for Xpert Xpress SARS-CoV-2/FLU/RSV testing.  Fact Sheet for Patients: BloggerCourse.com  Fact Sheet for Healthcare  Providers: SeriousBroker.it  This test is not yet approved or cleared by the [place] FDA and has been authorized for detection and/or diagnosis of SARS-CoV-2 by FDA under an Emergency Use Authorization (EUA). This EUA will remain in effect (meaning this test can be used) for the duration of the COVID-19 declaration under Section 564(b)(1) of the Act, 21 U.S.C. section 360bbb-3(b)(1), unless the authorization is terminated or revoked.  Performed at [facility], [address] [place], [place] [address]     Labs: CBC: Recent Labs  Lab 10/27/21 1611 10/28/21 0523 10/28/21 1130 10/28/21 1923 10/29/21 0034 10/29/21 0359 10/30/21 0411 10/30/21 1134  WBC 7.0 6.7  --   --   --  7.9 6.4  --   NEUTROABS 4.2  --   --   --   --   --   --   --   HGB 6.4* 9.1*   < > 7.3* 8.3* 8.8* 7.7* 7.6*  HCT 20.8* 27.8*  --  21.3* 25.2* 26.7* 23.7* 23.8*  MCV 86.3 84.2  --   --   --  84.0 85.9  --  PLT 193 147*  --   --   --  166 185  --    < > = values in this interval not displayed.   Basic Metabolic Panel: Recent Labs  Lab 10/27/21 1611 10/28/21 0523 10/29/21 0359 10/30/21 0411  NA 135 139 141 143  K 3.9 3.7 3.8 4.0  CL 103 109 113* 118*  CO2 25 26 24 28   GLUCOSE 307* 198* 161* 194*  BUN 25* 21 11 10   CREATININE 0.90 0.68 0.62 0.81  CALCIUM 8.9 8.5* 8.2* 8.5*   Liver Function Tests: Recent Labs  Lab 10/27/21 1611  AST 29  ALT 16  ALKPHOS 33*  BILITOT 0.4  PROT 6.3*  ALBUMIN 3.2*   CBG: Recent Labs  Lab 10/30/21 1352 10/30/21 1535 10/30/21 2017 10/31/21 0729 10/31/21 1128  GLUCAP 165* 89 191* 158* 284*    Discharge time spent: greater than 30 minutes.  Signed: 11/02/21, MD [facility] 10/31/2021

## 2021-10-31 NOTE — Progress Notes (Signed)
Discharge instructions, RX's and follow up care explained and provided to patient and grand daughter. Patient left floor via wheelchair accompanied by staff no c/o pain or shortness of breath at d/c.   Jadalee Westcott, Kae Heller, RN

## 2022-02-22 ENCOUNTER — Emergency Department
Admission: EM | Admit: 2022-02-22 | Discharge: 2022-02-22 | Disposition: A | Discharge status: Home / Self Care | Payer: Medicare Other | Attending: Emergency Medicine | Admitting: Emergency Medicine

## 2022-02-22 ENCOUNTER — Other Ambulatory Visit: Payer: Self-pay

## 2022-02-22 ENCOUNTER — Emergency Department: Payer: Medicare Other

## 2022-02-22 DIAGNOSIS — R531 Weakness: Secondary | ICD-10-CM | POA: Diagnosis present

## 2022-02-22 DIAGNOSIS — R296 Repeated falls: Secondary | ICD-10-CM | POA: Diagnosis not present

## 2022-02-22 LAB — URINALYSIS, ROUTINE W REFLEX MICROSCOPIC
Bilirubin Urine: NEGATIVE
Glucose, UA: >=500 mg/dL — AB
Hgb urine dipstick: NEGATIVE
Ketones, ur: NEGATIVE mg/dL
Leukocytes,Ua: NEGATIVE
Nitrite: NEGATIVE
Protein, ur: NEGATIVE mg/dL
Specific Gravity, Urine: 1.022 (ref 1.005–1.030)
pH: 5 (ref 5.0–8.0)

## 2022-02-22 LAB — CBC WITH DIFFERENTIAL/PLATELET
Abs Immature Granulocytes: 0.01 10*3/uL (ref 0.00–0.07)
Basophils Absolute: 0 10*3/uL (ref 0.0–0.1)
Basophils Relative: 1 %
Eosinophils Absolute: 0.3 10*3/uL (ref 0.0–0.5)
Eosinophils Relative: 6 %
HCT: 39.6 % (ref 36.0–46.0)
Hemoglobin: 12.4 g/dL (ref 12.0–15.0)
Immature Granulocytes: 0 %
Lymphocytes Relative: 27 %
Lymphs Abs: 1.5 10*3/uL (ref 0.7–4.0)
MCH: 26.3 pg (ref 26.0–34.0)
MCHC: 31.3 g/dL (ref 30.0–36.0)
MCV: 83.9 fL (ref 80.0–100.0)
Monocytes Absolute: 0.6 10*3/uL (ref 0.1–1.0)
Monocytes Relative: 10 %
Neutro Abs: 3.3 10*3/uL (ref 1.7–7.7)
Neutrophils Relative %: 56 %
Platelets: 236 10*3/uL (ref 150–400)
RBC: 4.72 MIL/uL (ref 3.87–5.11)
RDW: 14.2 % (ref 11.5–15.5)
WBC: 5.8 10*3/uL (ref 4.0–10.5)
nRBC: 0 % (ref 0.0–0.2)

## 2022-02-22 LAB — TROPONIN I (HIGH SENSITIVITY)
Troponin I (High Sensitivity): 17 ng/L (ref <18)
Troponin I (High Sensitivity): 21 ng/L — ABNORMAL HIGH (ref <18)

## 2022-02-22 LAB — COMPREHENSIVE METABOLIC PANEL
ALT: 31 U/L (ref 0–44)
AST: 36 U/L (ref 15–41)
Albumin: 3.6 g/dL (ref 3.5–5.0)
Alkaline Phosphatase: 59 U/L (ref 38–126)
Anion gap: 8 (ref 5–15)
BUN: 23 mg/dL (ref 8–23)
CO2: 26 mmol/L (ref 22–32)
Calcium: 10.1 mg/dL (ref 8.9–10.3)
Chloride: 106 mmol/L (ref 98–111)
Creatinine, Ser: 0.75 mg/dL (ref 0.44–1.00)
GFR, Estimated: >60 mL/min (ref >60)
Glucose, Bld: 263 mg/dL — ABNORMAL HIGH (ref 70–99)
Potassium: 3.5 mmol/L (ref 3.5–5.1)
Sodium: 140 mmol/L (ref 135–145)
Total Bilirubin: 0.6 mg/dL (ref 0.3–1.2)
Total Protein: 8 g/dL (ref 6.5–8.1)

## 2022-02-22 LAB — CK: Total CK: 100 U/L (ref 38–234)

## 2022-02-22 NOTE — ED Provider Notes (Signed)
[facility] Provider Note   Event Date/Time   First MD Initiated Contact with Patient 02/22/22 2034     (approximate) History  No chief complaint on file.  HPI Susan [name] is a 88 y.o. female with a past medical history of CVA who presents from her independent living facility after being found on the floor Monday by one of her daughters and told her that she had been on the floor for a couple days.  Patient was then found on the floor again the subsequent day and patient's granddaughter flew in in order to bring her to the emergency department for evaluation.  Patient states that she just does not have the energy to get up off the floor so when she gets there she scoots around to do what ever she needs to do in her apartment.  Patient states that she does have a wheelchair but the wheelchair did not have any brakes on it and therefore she could not transfer to it.  Patient now has a wheelchair for her apartment as well as multiple other grab bars and a bedside commode that patient's granddaughter has brought for her since her arrival. ROS: Patient currently denies any vision changes, tinnitus, difficulty speaking, facial droop, sore throat, chest pain, shortness of breath, abdominal pain, nausea/vomiting/diarrhea, dysuria, or weakness/numbness/paresthesias in any extremity   Physical Exam  Triage Vital Signs: ED Triage Vitals  Enc Vitals Group     BP 02/22/22 1639 (!) 178/82     Pulse Rate 02/22/22 1639 84     Resp 02/22/22 1639 18     Temp 02/22/22 1639 98.6 F (37 C)     Temp Source 02/22/22 1639 Oral     SpO2 02/22/22 1639 96 %     Weight 02/22/22 1641 110 lb (49.9 kg)     Height 02/22/22 1641 5\' 6"  (1.676 m)     Head Circumference --      Peak Flow --      Pain Score --      Pain Loc --      Pain Edu? --      Excl. in GC? --    Most recent vital signs: Vitals:   02/22/22 2100 02/22/22 2209  BP: (!) 184/72 (!) 148/64  Pulse: 88 85  Resp: (!) 31 12   Temp:  (!) 97.5 F (36.4 C)  SpO2: 100% 100%   General: Awake, oriented x4. CV:  Good peripheral perfusion.  Resp:  Normal effort.  Abd:  No distention.  Other:  Elderly cachectic African-American female laying in bed in no acute distress ED Results / Procedures / Treatments  Labs (all labs ordered are listed, but only abnormal results are displayed) Labs Reviewed  URINALYSIS, ROUTINE W REFLEX MICROSCOPIC - Abnormal; Notable for the following components:      Result Value   Color, Urine YELLOW (*)    APPearance HAZY (*)    Glucose, UA >=500 (*)    Bacteria, UA RARE (*)    All other components within normal limits  COMPREHENSIVE METABOLIC PANEL - Abnormal; Notable for the following components:   Glucose, Bld 263 (*)    All other components within normal limits  TROPONIN I (HIGH SENSITIVITY) - Abnormal; Notable for the following components:   Troponin I (High Sensitivity) 21 (*)    All other components within normal limits  CBC WITH DIFFERENTIAL/PLATELET  CK  TROPONIN I (HIGH SENSITIVITY)   EKG ED ECG REPORT I, 2210  Jaysin Gayler, the attending physician, personally viewed and interpreted this ECG. Date: 02/22/2022 EKG Time: 1643 Rate: 80 Rhythm: normal sinus rhythm QRS Axis: normal Intervals: normal ST/T Wave abnormalities: normal Narrative Interpretation: no evidence of acute ischemia RADIOLOGY ED MD interpretation: CT of the head without contrast interpreted by me shows no evidence of acute abnormalities including no intracerebral hemorrhage, obvious masses, or significant edema  CT of the cervical spine interpreted by me does not show any evidence of acute abnormalities including no acute fracture, malalignment, height loss, or dislocation One-view portable chest x-ray interpreted by me shows no evidence of acute abnormalities including no pneumonia, pneumothorax, or widened mediastinum  X-ray of the pelvis interpreted by me and shows no evidence of acute osseous  abnormalities -Agree with radiology assessment Official radiology report(s): CT Head Wo Contrast  Result Date: 02/22/2022 CLINICAL DATA:  Found on the floor early this week. EXAM: CT HEAD WITHOUT CONTRAST CT CERVICAL SPINE WITHOUT CONTRAST TECHNIQUE: Multidetector CT imaging of the head and cervical spine was performed following the standard protocol without intravenous contrast. Multiplanar CT image reconstructions of the cervical spine were also generated. RADIATION DOSE REDUCTION: This exam was performed according to the departmental dose-optimization program which includes automated exposure control, adjustment of the mA and/or kV according to patient size and/or use of iterative reconstruction technique. COMPARISON:  None Available. FINDINGS: CT HEAD FINDINGS Brain: No evidence of acute infarction, hemorrhage, hydrocephalus, extra-axial collection or mass lesion/mass effect. There is age related ventricular and sulcal enlargement. Patchy areas of white matter hypoattenuation are also noted consistent with mild chronic microvascular ischemic change. Vascular: No hyperdense vessel or unexpected calcification. Skull: Normal. Negative for fracture or focal lesion. Sinuses/Orbits: Globes and orbits are unremarkable. Visualized sinuses are clear. Other: Left superior parietal scalp contusion/hematoma. CT CERVICAL SPINE FINDINGS Alignment: Mild reversal the normal cervical lordosis, apex at C6. No spondylolisthesis. Skull base and vertebrae: No acute fracture. No primary bone lesion or focal pathologic process. Soft tissues and spinal canal: No prevertebral fluid or swelling. No visible canal hematoma. Disc levels: Mild loss of disc height at C4-C5. Moderate loss of disc height at C5-C6 and C6-C7 as well as C7-T1. Mild disc bulging. Facet degenerative changes bilaterally most prominent on the right at C4-C5. No evidence of a disc herniation. Upper chest: No acute or significant abnormality. Other: None.  IMPRESSION: HEAD CT 1. No acute intracranial abnormalities. 2. Left superior parietal scalp contusion/hematoma. No skull fracture. CERVICAL CT 1. No fracture or acute finding. Electronically Signed   By: Amie Portland M.D.   On: 02/22/2022 18:13   CT Cervical Spine Wo Contrast  Result Date: 02/22/2022 CLINICAL DATA:  Found on the floor early this week. EXAM: CT HEAD WITHOUT CONTRAST CT CERVICAL SPINE WITHOUT CONTRAST TECHNIQUE: Multidetector CT imaging of the head and cervical spine was performed following the standard protocol without intravenous contrast. Multiplanar CT image reconstructions of the cervical spine were also generated. RADIATION DOSE REDUCTION: This exam was performed according to the departmental dose-optimization program which includes automated exposure control, adjustment of the mA and/or kV according to patient size and/or use of iterative reconstruction technique. COMPARISON:  None Available. FINDINGS: CT HEAD FINDINGS Brain: No evidence of acute infarction, hemorrhage, hydrocephalus, extra-axial collection or mass lesion/mass effect. There is age related ventricular and sulcal enlargement. Patchy areas of white matter hypoattenuation are also noted consistent with mild chronic microvascular ischemic change. Vascular: No hyperdense vessel or unexpected calcification. Skull: Normal. Negative for fracture or focal lesion. Sinuses/Orbits: Globes and orbits  are unremarkable. Visualized sinuses are clear. Other: Left superior parietal scalp contusion/hematoma. CT CERVICAL SPINE FINDINGS Alignment: Mild reversal the normal cervical lordosis, apex at C6. No spondylolisthesis. Skull base and vertebrae: No acute fracture. No primary bone lesion or focal pathologic process. Soft tissues and spinal canal: No prevertebral fluid or swelling. No visible canal hematoma. Disc levels: Mild loss of disc height at C4-C5. Moderate loss of disc height at C5-C6 and C6-C7 as well as C7-T1. Mild disc bulging.  Facet degenerative changes bilaterally most prominent on the right at C4-C5. No evidence of a disc herniation. Upper chest: No acute or significant abnormality. Other: None. IMPRESSION: HEAD CT 1. No acute intracranial abnormalities. 2. Left superior parietal scalp contusion/hematoma. No skull fracture. CERVICAL CT 1. No fracture or acute finding. Electronically Signed   By: Amie Portlandavid  Ormond M.D.   On: 02/22/2022 18:13   DG Pelvis 1-2 Views  Result Date: 02/22/2022 CLINICAL DATA:  Multiple falls EXAM: PELVIS - 1-2 VIEW COMPARISON:  05/29/2021 FINDINGS: Single frontal view of the pelvis includes both hips. The patient is rotated toward the left. No acute fracture, subluxation, or dislocation within either hip. The remainder of the bony pelvis is unremarkable. Soft tissues are normal. Diffuse atherosclerosis. IMPRESSION: 1. No acute displaced fracture. Electronically Signed   By: Sharlet SalinaMichael  Brown M.D.   On: 02/22/2022 17:20   DG Chest Port 1 View  Result Date: 02/22/2022 CLINICAL DATA:  Patient found on the floor by her daughters. Patient may have been there for a couple of days. EXAM: PORTABLE CHEST 1 VIEW COMPARISON:  05/28/2021 and older exams. FINDINGS: Cardiac silhouette is normal in size. Normal mediastinal and hilar contours. Clear lungs.  No pleural effusion or pneumothorax. Skeletal structures are grossly intact. IMPRESSION: No active disease. Electronically Signed   By: Amie Portlandavid  Ormond M.D.   On: 02/22/2022 17:20   PROCEDURES: Critical Care performed: No .1-3 Lead EKG Interpretation  Performed by: Merwyn KatosBradler, Bobbi Kozakiewicz K, MD Authorized by: Merwyn KatosBradler, Pristine Gladhill K, MD     Interpretation: normal     ECG rate:  84   ECG rate assessment: normal     Rhythm: sinus rhythm     Ectopy: none     Conduction: normal    MEDICATIONS ORDERED IN ED: Medications - No data to display IMPRESSION / MDM / ASSESSMENT AND PLAN / ED COURSE  I reviewed the triage vital signs and the nursing notes.                              The patient is on the cardiac monitor to evaluate for evidence of arrhythmia and/or significant heart rate changes. Patient's presentation is most consistent with acute presentation with potential threat to life or bodily function. Presenting after multiple falls that occurred prior to arrival, resulting in injury to the left hip. The mechanism of injury was a mechanical ground level fall without syncope or near-syncope. The current level of pain is moderate. There was no loss of consciousness, confusion, seizure, or memory impairment. There is not a laceration associated with the injury. Denies neck pain. The patient does not take blood thinner medications. Denies vomiting, numbness/weakness, fever Patient work-up is negative for any evidence of acute infection or other cause for her generalized weakness.  Patient does have increased assistive devices in her apartment as well as family members that are working on arranging PT/OT consult with patient's PCP for possible transfer to a higher level of care.  Dispo: Discharge with PCP follow-up       FINAL CLINICAL IMPRESSION(S) / ED DIAGNOSES   Final diagnoses:  Generalized weakness  Frequent falls   Rx / DC Orders   ED Discharge Orders     None      Note:  This document was prepared using Dragon voice recognition software and may include unintentional dictation errors.   Merwyn Katos, MD 02/22/22 [phone]

## 2022-02-22 NOTE — Discharge Instructions (Addendum)
Please request a physical therapy and Occupational Therapy evaluation from her primary care provider in order to generate an FL 2 for proper placement for increasing nursing care as appropriate.

## 2022-02-22 NOTE — ED Triage Notes (Signed)
Pt was found on the floor Monday by one of her daughters and the pt had told the daughter that she had been in the floor a couple days- pt was then found on the floor again Tuesday by her granddaughter- pt oldest daughter flew into town and wanted to get her checked for UTI or other injuries after having multiple falls- pt is normally able to do things on her own at home- today daughter bought her more assistive devices to have a home as well

## 2022-02-22 NOTE — ED Provider Triage Note (Signed)
Emergency Medicine Provider Triage Evaluation Note  LAVONYA HOERNER , a 88 y.o. female  was evaluated in triage.  Pt brought in by daughter. Sibling found her on the floor on Monday, and patient reports that she was there for a couple of days. Found again on Tuesday on the floor. Patient denies any pain. No fever  Review of Systems  Positive: No complaints Negative: Pain, fever  Physical Exam  There were no vitals taken for this visit. Gen:   Awake, no distress   Resp:  Normal effort  MSK:   Moves extremities without difficulty  Other:    Medical Decision Making  Medically screening exam initiated at 4:37 PM.  Appropriate orders placed.  [name] was informed that the remainder of the evaluation will be completed by another provider, this initial triage assessment does not replace that evaluation, and the importance of remaining in the ED until their evaluation is complete.     Jackelyn Hoehn, PA-C 02/22/22 1645

## 2022-10-27 ENCOUNTER — Ambulatory Visit: Payer: Medicare Other | Attending: Family Medicine | Admitting: Occupational Therapy

## 2022-10-27 DIAGNOSIS — M6281 Muscle weakness (generalized): Secondary | ICD-10-CM | POA: Diagnosis present

## 2022-10-27 DIAGNOSIS — R278 Other lack of coordination: Secondary | ICD-10-CM | POA: Insufficient documentation

## 2022-10-27 NOTE — Therapy (Cosign Needed)
OUTPATIENT OCCUPATIONAL THERAPY NEURO EVALUATION  Patient Name: Susan [name] MRN: [id number] DOB:[birth year], 89 y.o., female Today's Date: 10/27/2022  PCP: [name] REFERRING PROVIDER: [name]  END OF SESSION:  OT End of Session - 10/27/22 1718     Visit Number 1    Number of Visits 24    Date for OT Re-Evaluation 01/19/23    OT Start Time 1445    OT Stop Time 1545    OT Time Calculation (min) 60 min    Equipment Utilized During Treatment Wheelchair    Activity Tolerance Patient tolerated treatment well    Behavior During Therapy [facility] for tasks assessed/performed             Past Medical History:  Diagnosis Date   Anemia    Balance problem    Diabetes mellitus without complication (HCC)    Diverticulosis    Heart murmur    Hypercholesteremia    Hypertension    Stroke [facility])    TIA 2006   TIA (transient ischemic attack) 2006   Past Surgical History:  Procedure Laterality Date   CATARACT EXTRACTION W/PHACO Left 05/22/2015   Procedure: CATARACT EXTRACTION PHACO AND INTRAOCULAR LENS PLACEMENT (IOC);  Surgeon: Galen Manila, MD;  Location: [facility] ORS;  Service: Ophthalmology;  Laterality: Left;  [place]: 01:07.9 AP%: 23.0 CDE: 15.62 Lot# [id number]   CATARACT EXTRACTION W/PHACO Right 06/12/2015   Procedure: CATARACT EXTRACTION PHACO AND INTRAOCULAR LENS PLACEMENT (IOC);  Surgeon: Galen Manila, MD;  Location: [facility] ORS;  Service: Ophthalmology;  Laterality: Right;  [place] 00:57 AP% 17.1 CDE 9.86 fluid pack lot # [id number]   COLON SURGERY     resection   COLONOSCOPY WITH PROPOFOL N/A 10/29/2021   Procedure: COLONOSCOPY WITH PROPOFOL;  Surgeon: Regis Bill, MD;  Location: [facility] ENDOSCOPY;  Service: Endoscopy;  Laterality: N/A;   ECTOPIC PREGNANCY SURGERY     HERNIA REPAIR     TONSILLECTOMY     Patient Active Problem List   Diagnosis Date Noted   Renal lesion, right 10/30/2021   Acute blood loss anemia 10/30/2021   Lower GI bleed 10/27/2021   GI bleeding  05/29/2021   CVA (cerebral vascular accident) (HCC) 07/04/2017    ONSET DATE: 04/2018  REFERRING DIAG: CVA  THERAPY DIAG:  Other lack of coordination  Muscle weakness (generalized)  Rationale for Evaluation and Treatment: Rehabilitation  SUBJECTIVE:   SUBJECTIVE STATEMENT: Pt. Reports that she is doing well today. Pt. Reports that she desires to improve handwriting skills.  Pt accompanied by: self  PERTINENT HISTORY: Pt. Is a 89 y.o. female who has a past medical history of CVA, hypertension, type ll DM, chronic anemia, history of diverticular bleed in February 2023. Pt. Has had a recent fall on 02/22/2022 and was found laying on the floor and had been laying there for a couple of days. Pt. Presents with generalized weakness and was referred to OT by her PCP to help with decrease in handwriting skills over the last few years.  PRECAUTIONS: None   WEIGHT BEARING RESTRICTIONS: No  PAIN:  Are you having pain? No  FALLS: Has patient fallen in last 6 months? Yes. Number of falls 1-2  LIVING ENVIRONMENT: Lives with: lives alone Lives in: Independent living facility that has apartments called "[facility]" Stairs: Engineer, structural Has following equipment at home: Dan Humphreys - 2 wheeled, Environmental consultant - 4 wheeled, Wheelchair (power), shower chair, and Grab bars  PLOF: Independent  PATIENT GOALS: Desires to improve handwriting so that she is able to  write her own checks  OBJECTIVE:   HAND DOMINANCE: Left  ADLs:  Eating: Independent with feeding self Grooming: Independent UB Dressing: Able to put blouse on, does not wear shirt with buttons LB Dressing: Able to dress self with elastic pants, able to put socks and shoes Toileting: Independent Bathing: Pt. Has someone who comes and gives her a shower Tub Shower transfers: Depends on Aid to help get in and out of the walk in shower Equipment: Shower seat with back, Grab bars, Walk in shower, and bed side commode  IADLs: Shopping: Grand  daughter does the grocery shopping however Pt. Reports going with her sometimes Light housekeeping: Pt. States she keeps a clean house and can vacuum from her wheelchair Meal Prep: Meals on wheels brings her food, grand daughter will also help prepare light meals Community mobility: Public transportation Medication management: Independent, uses pill Nature conservation officer: Granddaguther helps pay pills and writes checks Handwriting: Print 25% legible Cursive: 25% legible Pt. Was asked to write name on paper and instead of writing side to side Pt. wrote vertically  MOBILITY STATUS: Needs Assist: Wheelchair  POSTURE COMMENTS:  No Significant postural limitations Sitting balance: WFL  ACTIVITY TOLERANCE: Activity tolerance: WFL  FUNCTIONAL OUTCOME MEASURES: FOTO: 69 TR: 68  UPPER EXTREMITY ROM:    Active ROM Right WFL Left WFL  Shoulder flexion    Shoulder abduction    Shoulder adduction    Shoulder extension    Shoulder internal rotation    Shoulder external rotation    Elbow flexion    Elbow extension    Wrist flexion    Wrist extension    Wrist ulnar deviation    Wrist radial deviation    Wrist pronation    Wrist supination    (Blank rows = not tested)  UPPER EXTREMITY MMT:     MMT Right eval Left eval  Shoulder flexion 4+/5 4/5  Shoulder abduction 4+/5 4/5  Shoulder adduction    Shoulder extension    Shoulder internal rotation    Shoulder external rotation    Middle trapezius    Lower trapezius    Elbow flexion 5/5 4+/5  Elbow extension 5/5 4+/5  Wrist flexion 4+/5 4/5  Wrist extension 4+/5 4/5  Wrist ulnar deviation    Wrist radial deviation    Wrist pronation    Wrist supination    (Blank rows = not tested)  HAND FUNCTION: Grip strength: Right: 40 lbs; Left: 44 lbs, Lateral pinch: Right: 7 lbs, Left: 8 lbs, and 3 point pinch: Right: 6 lbs, Left: 7 lbs  COORDINATION: 9 Hole Peg test: Right: 1 minute 10 sec; Left: 58 sec  SENSATION: Not  tested  EDEMA: None  MUSCLE TONE: WFL  COGNITION: Overall cognitive status: Within functional limits for tasks assessed  VISION: Subjective report: Pt. Reports that she wears glasses for most of the time however takes them off sometimes. Baseline vision: Wear glasses most of the time Visual history: cataracts  PERCEPTION: WFL  PRAXIS: WFL   TODAY'S TREATMENT:  DATE: 10/27/2022   Measurements were obtained and goals were established PATIENT EDUCATION: Education details: OT services, POC, Goals Person educated: Patient Education method: Explanation, Demonstration, and Verbal cues Education comprehension: verbalized understanding  HOME EXERCISE PROGRAM: Will continue ongoing assessment of HEP needs and provide as indicated.   GOALS: Goals reviewed with patient? Yes  SHORT TERM GOALS: Target date: 01/08/2023  Pt. Will improve FOTO score by 2 points to reflect improved perceived function performance in specific ADL/IADL. Baseline: FOTO: 69 TR: 68 Goal status: INITIAL  LONG TERM GOALS: Target date: 01/19/2023  Pt. Will improve R and L hand FMC skills by 10 seconds of speed to independently manipulate small objects during ADL/IADL tasks. Baseline: Eval: 9 Hole Peg test: Right: 1 minute 10 sec; Left: 58 sec Goal status: INITIAL  2.  Pt. Will improve L shoulder strength by 1 mm grade to be able to sustain LUE elevation while performing ADL/IADL tasks. Baseline: Eval: L: Shoulder flexion: 4/5, Shoulder abduction: 4/5 Goal status: INITIAL  3.  Pt. Will improve L hand FMC/dexterity skills to be able to sign her name on checks with 75% legibility, using writing aids as needed. Baseline: Eval: Print: 25% legibility, Cursive: 25% legibility Goal status: INITIAL  ASSESSMENT:  CLINICAL IMPRESSION: Patient is a 89 y.o. female who was seen today for  occupational therapy evaluation for CVA and generalized weakness. Pt. Presents with decreased LUE strength, and coordination in right and left hand impacting ones handwriting legibility and ability to engage in daily tasks. Pt. desires to increase handwriting legibility. Pts. FOTO score was 69. Pt. Would benefit from skilled OT services to increase [facility] skills in the R and L hand and improve LUE strengthen to increase independence and enhance engagement in ADL/IADL tasks.   PERFORMANCE DEFICITS: in functional skills including ADLs, IADLs, coordination, strength, and Fine motor control, cognitive skills including problem solving, and psychosocial skills including coping strategies, environmental adaptation, and routines and behaviors.   IMPAIRMENTS: are limiting patient from ADLs and IADLs.   CO-MORBIDITIES: may have co-morbidities  that affects occupational performance. Patient will benefit from skilled OT to address above impairments and improve overall function.  MODIFICATION OR ASSISTANCE TO COMPLETE EVALUATION: Min-Moderate modification of tasks or assist with assess necessary to complete an evaluation.  OT OCCUPATIONAL PROFILE AND HISTORY: Detailed assessment: Review of records and additional review of physical, cognitive, psychosocial history related to current functional performance.  CLINICAL DECISION MAKING: Moderate - several treatment options, min-mod task modification necessary  REHAB POTENTIAL: Good  EVALUATION COMPLEXITY: Moderate    PLAN:  OT FREQUENCY: 2x/week  OT DURATION: 12 weeks  PLANNED INTERVENTIONS: self care/ADL training, therapeutic exercise, therapeutic activity, neuromuscular re-education, and patient/family education  RECOMMENDED OTHER SERVICES: N/A  CONSULTED AND AGREED WITH PLAN OF CARE: Patient  PLAN FOR NEXT SESSION: Initiate treatment   Herma Carson, Student-OT 10/27/2022, 5:19 PM

## 2022-10-28 ENCOUNTER — Ambulatory Visit: Payer: Medicare Other | Admitting: Occupational Therapy

## 2022-10-29 ENCOUNTER — Ambulatory Visit: Payer: Medicare Other | Admitting: Occupational Therapy

## 2022-10-29 DIAGNOSIS — M6281 Muscle weakness (generalized): Secondary | ICD-10-CM

## 2022-10-29 DIAGNOSIS — R278 Other lack of coordination: Secondary | ICD-10-CM

## 2022-10-29 NOTE — Therapy (Addendum)
OUTPATIENT OCCUPATIONAL THERAPY NEURO TREATMENT NOTE  Patient Name: Susan [name] MRN: [id number] DOB:02-11-1927, 89 y.o., female Today's Date: 10/29/2022  PCP: [name] REFERRING PROVIDER: [name]  END OF SESSION:  OT End of Session - 10/29/22 1429     Visit Number 2    Number of Visits 24    Date for OT Re-Evaluation 01/19/23    OT Start Time 1330    OT Stop Time 1415    OT Time Calculation (min) 45 min    Equipment Utilized During Treatment Wheelchair    Activity Tolerance Patient tolerated treatment well    Behavior During Therapy [facility] for tasks assessed/performed             Past Medical History:  Diagnosis Date   Anemia    Balance problem    Diabetes mellitus without complication (HCC)    Diverticulosis    Heart murmur    Hypercholesteremia    Hypertension    Stroke [facility])    TIA 2006   TIA (transient ischemic attack) 2006   Past Surgical History:  Procedure Laterality Date   CATARACT EXTRACTION W/PHACO Left 05/22/2015   Procedure: CATARACT EXTRACTION PHACO AND INTRAOCULAR LENS PLACEMENT (IOC);  Surgeon: Galen Manila, MD;  Location: [facility] ORS;  Service: Ophthalmology;  Laterality: Left;  [place]: 01:07.9 AP%: 23.0 CDE: 15.62 Lot# [id number]   CATARACT EXTRACTION W/PHACO Right 06/12/2015   Procedure: CATARACT EXTRACTION PHACO AND INTRAOCULAR LENS PLACEMENT (IOC);  Surgeon: Galen Manila, MD;  Location: [facility] ORS;  Service: Ophthalmology;  Laterality: Right;  [place] 00:57 AP% 17.1 CDE 9.86 fluid pack lot # [id number]   COLON SURGERY     resection   COLONOSCOPY WITH PROPOFOL N/A 10/29/2021   Procedure: COLONOSCOPY WITH PROPOFOL;  Surgeon: Regis Bill, MD;  Location: [facility] ENDOSCOPY;  Service: Endoscopy;  Laterality: N/A;   ECTOPIC PREGNANCY SURGERY     HERNIA REPAIR     TONSILLECTOMY     Patient Active Problem List   Diagnosis Date Noted   Renal lesion, right 10/30/2021   Acute blood loss anemia 10/30/2021   Lower GI bleed 10/27/2021   GI bleeding  05/29/2021   CVA (cerebral vascular accident) (HCC) 07/04/2017    ONSET DATE: 04/2018  REFERRING DIAG: CVA  THERAPY DIAG:  Muscle weakness (generalized)  Other lack of coordination  Rationale for Evaluation and Treatment: Rehabilitation  SUBJECTIVE:   SUBJECTIVE STATEMENT: Pt. Reports that she is doing well today. Pt. Reports that she would like to work  on writing legibility for simulated check writing tasks next session. Pt accompanied by: self  PERTINENT HISTORY: Pt. Is a 89 y.o. female who has a past medical history of CVA, hypertension, type ll DM, chronic anemia, history of diverticular bleed in February 2023. Pt. Has had a recent fall on 02/22/2022 and was found laying on the floor and had been laying there for a couple of days. Pt. Presents with generalized weakness and was referred to OT by her PCP to help with decrease in handwriting skills over the last few years.  PRECAUTIONS: None   WEIGHT BEARING RESTRICTIONS: No  PAIN:  Are you having pain? No  FALLS: Has patient fallen in last 6 months? Yes. Number of falls 1-2  LIVING ENVIRONMENT: Lives with: lives alone Lives in: Independent living facility that has apartments called "[facility]" Stairs: Engineer, structural Has following equipment at home: Dan Humphreys - 2 wheeled, Environmental consultant - 4 wheeled, Wheelchair (power), shower chair, and Grab bars  PLOF: Independent  PATIENT GOALS:  Desires to improve handwriting so that she is able to write her own checks  OBJECTIVE:   HAND DOMINANCE: Left  ADLs:  Eating: Independent with feeding self Grooming: Independent UB Dressing: Able to put blouse on, does not wear shirt with buttons LB Dressing: Able to dress self with elastic pants, able to put socks and shoes Toileting: Independent Bathing: Pt. Has someone who comes and gives her a shower Tub Shower transfers: Depends on Aid to help get in and out of the walk in shower Equipment: Shower seat with back, Grab bars, Walk in shower,  and bed side commode  IADLs: Shopping: Grand daughter does the grocery shopping however Pt. Reports going with her sometimes Light housekeeping: Pt. States she keeps a clean house and can vacuum from her wheelchair Meal Prep: Meals on wheels brings her food, grand daughter will also help prepare light meals Community mobility: Public transportation Medication management: Independent, uses pill Nature conservation officer: Granddaguther helps pay pills and writes checks Handwriting: Print 25% legible Cursive: 25% legible Pt. Was asked to write name on paper and instead of writing side to side Pt. wrote vertically  MOBILITY STATUS: Needs Assist: Wheelchair  POSTURE COMMENTS:  No Significant postural limitations Sitting balance: WFL  ACTIVITY TOLERANCE: Activity tolerance: WFL  FUNCTIONAL OUTCOME MEASURES: FOTO: 69 TR: 68  UPPER EXTREMITY ROM:    Active ROM Right WFL Left WFL  Shoulder flexion    Shoulder abduction    Shoulder adduction    Shoulder extension    Shoulder internal rotation    Shoulder external rotation    Elbow flexion    Elbow extension    Wrist flexion    Wrist extension    Wrist ulnar deviation    Wrist radial deviation    Wrist pronation    Wrist supination    (Blank rows = not tested)  UPPER EXTREMITY MMT:     MMT Right eval Left eval  Shoulder flexion 4+/5 4/5  Shoulder abduction 4+/5 4/5  Shoulder adduction    Shoulder extension    Shoulder internal rotation    Shoulder external rotation    Middle trapezius    Lower trapezius    Elbow flexion 5/5 4+/5  Elbow extension 5/5 4+/5  Wrist flexion 4+/5 4/5  Wrist extension 4+/5 4/5  Wrist ulnar deviation    Wrist radial deviation    Wrist pronation    Wrist supination    (Blank rows = not tested)  HAND FUNCTION: Grip strength: Right: 40 lbs; Left: 44 lbs, Lateral pinch: Right: 7 lbs, Left: 8 lbs, and 3 point pinch: Right: 6 lbs, Left: 7 lbs  COORDINATION: 9 Hole Peg test: Right:  1 minute 10 sec; Left: 58 sec  SENSATION: Not tested  EDEMA: None  MUSCLE TONE: WFL  COGNITION: Overall cognitive status: Within functional limits for tasks assessed  VISION: Subjective report: Pt. Reports that she wears glasses for most of the time however takes them off sometimes. Baseline vision: Wear glasses most of the time Visual history: cataracts  PERCEPTION: WFL  PRAXIS: WFL   TODAY'S TREATMENT:  DATE: 10/29/2022   Therapeutic Exercise:   Pt. Worked on green thearputty ex. For hand strengthening. Exercises including: gross gripping, gross digit extension, lateral, and 3pt. Pinch strengthening, digit abduction, and thumb opposition. Pt. Was provided with a visual handout HEP through [employer].   Self Care/Home Management:   Pt. Worked on Public relations account executive by practicing writing her full name in cursive on lined paper. The task was modified to just writing her first name in cursive and print on lined paper. Pt. Worked on letter formation completing the  alphabet in upper case and lower case letters. Pt. Was challenged to write within the lines and have consistent spacing between each letter.  PATIENT EDUCATION: Education details: OT services, POC, Goals Person educated: Patient Education method: Explanation, Demonstration, and Verbal cues Education comprehension: verbalized understanding  HOME EXERCISE PROGRAM: Will continue ongoing assessment of HEP needs and provide as indicated.   GOALS: Goals reviewed with patient? Yes  SHORT TERM GOALS: Target date: 01/08/2023  Pt. Will improve FOTO score by 2 points to reflect improved perceived function performance in specific ADL/IADL. Baseline: FOTO: 69 TR: 68 Goal status: INITIAL  LONG TERM GOALS: Target date: 01/19/2023  Pt. Will improve R and L hand FMC skills by 10 seconds of speed to  independently manipulate small objects during ADL/IADL tasks. Baseline: Eval: 9 Hole Peg test: Right: 1 minute 10 sec; Left: 58 sec Goal status: INITIAL  2.  Pt. Will improve L shoulder strength by 1 mm grade to be able to sustain LUE elevation while performing ADL/IADL tasks. Baseline: Eval: L: Shoulder flexion: 4/5, Shoulder abduction: 4/5 Goal status: INITIAL  3.  Pt. Will improve L hand FMC/dexterity skills to be able to sign her name on checks with 75% legibility, using writing aids as needed. Baseline: Eval: Print: 25% legibility, Cursive: 25% legibility Goal status: INITIAL  ASSESSMENT:  CLINICAL IMPRESSION:  Pt. tolerated therapeutic exercise well. Pt. demonstrated and verbalized understanding of theraputty exercises however Pt. Required frequent verbal cues to ensure proper form throughout. Pt. Was receptive of theraputty exercises and reported that she will begin doing them at home. Pt. Was provided with an access code for [employer] to a video demonstration of each exercise.  Pt. Was able to print her name with 90% legibility 1/9 times, 75% legibility 3/9 times, 50% legibility 3/9 times, 25% legibility 2/9 times. Pt. Was able to write her name with increased legibility when encouraged to slow down and write it as neatly as possible. Pt. Was able to write the alphabet in upper case letters with 75% legibility however required increased time and went over the lines or did not connect the letter to the bottom line.   Pt. Was able to write the alphabet in lower case letters with 50% legibility, however, Pt. Required increased time and had difficulty formulating 5/26 letters resulting in her completely crossing them out. Pt. Would benefit from skilled OT services to increase [facility] skills in the R and L hand and improve LUE strengthen to increase independence and enhance engagement in ADL/IADL tasks.   PERFORMANCE DEFICITS: in functional skills including ADLs, IADLs, coordination, strength, and  Fine motor control, cognitive skills including problem solving, and psychosocial skills including coping strategies, environmental adaptation, and routines and behaviors.   IMPAIRMENTS: are limiting patient from ADLs and IADLs.   CO-MORBIDITIES: may have co-morbidities  that affects occupational performance. Patient will benefit from skilled OT to address above impairments and improve overall function.  MODIFICATION OR ASSISTANCE TO COMPLETE EVALUATION: Min-Moderate modification  of tasks or assist with assess necessary to complete an evaluation.  OT OCCUPATIONAL PROFILE AND HISTORY: Detailed assessment: Review of records and additional review of physical, cognitive, psychosocial history related to current functional performance.  CLINICAL DECISION MAKING: Moderate - several treatment options, min-mod task modification necessary  REHAB POTENTIAL: Good  EVALUATION COMPLEXITY: Moderate    PLAN:  OT FREQUENCY: 2x/week  OT DURATION: 12 weeks  PLANNED INTERVENTIONS: self care/ADL training, therapeutic exercise, therapeutic activity, neuromuscular re-education, and patient/family education  RECOMMENDED OTHER SERVICES: N/A  CONSULTED AND AGREED WITH PLAN OF CARE: Patient  PLAN FOR NEXT SESSION: Initiate treatment  Luretha Rued 10/29/2022, 2:31 PM  This entire session was performed under the direct supervision and direction of a licensed therapist. I have personally read, edited, and approve of the note as written.  Olegario Messier, MS, OTR/L   10/29/2022

## 2022-11-03 ENCOUNTER — Ambulatory Visit: Payer: Medicare Other | Admitting: Occupational Therapy

## 2022-11-05 ENCOUNTER — Ambulatory Visit: Payer: Medicare Other | Admitting: Occupational Therapy

## 2022-11-05 DIAGNOSIS — R278 Other lack of coordination: Secondary | ICD-10-CM | POA: Diagnosis not present

## 2022-11-05 DIAGNOSIS — M6281 Muscle weakness (generalized): Secondary | ICD-10-CM

## 2022-11-05 NOTE — Therapy (Cosign Needed)
OUTPATIENT OCCUPATIONAL THERAPY NEURO TREATMENT NOTE  Patient Name: Susan [name] MRN: [id number] DOB:October 13, 1926, 89 y.o., female Today's Date: 11/06/2022  PCP: [name] REFERRING PROVIDER: [name]  END OF SESSION:  OT End of Session - 11/05/22 1712     Visit Number 3    Number of Visits 24    Date for OT Re-Evaluation 01/19/23    OT Start Time 1530    OT Stop Time 1615    OT Time Calculation (min) 45 min    Equipment Utilized During Treatment Wheelchair    Activity Tolerance Patient tolerated treatment well    Behavior During Therapy [facility] for tasks assessed/performed             Past Medical History:  Diagnosis Date   Anemia    Balance problem    Diabetes mellitus without complication (HCC)    Diverticulosis    Heart murmur    Hypercholesteremia    Hypertension    Stroke [facility])    TIA 2006   TIA (transient ischemic attack) 2006   Past Surgical History:  Procedure Laterality Date   CATARACT EXTRACTION W/PHACO Left 05/22/2015   Procedure: CATARACT EXTRACTION PHACO AND INTRAOCULAR LENS PLACEMENT (IOC);  Surgeon: Galen Manila, MD;  Location: [facility] ORS;  Service: Ophthalmology;  Laterality: Left;  [place]: 01:07.9 AP%: 23.0 CDE: 15.62 Lot# [id number]   CATARACT EXTRACTION W/PHACO Right 06/12/2015   Procedure: CATARACT EXTRACTION PHACO AND INTRAOCULAR LENS PLACEMENT (IOC);  Surgeon: Galen Manila, MD;  Location: [facility] ORS;  Service: Ophthalmology;  Laterality: Right;  [place] 00:57 AP% 17.1 CDE 9.86 fluid pack lot # [id number]   COLON SURGERY     resection   COLONOSCOPY WITH PROPOFOL N/A 10/29/2021   Procedure: COLONOSCOPY WITH PROPOFOL;  Surgeon: Regis Bill, MD;  Location: [facility] ENDOSCOPY;  Service: Endoscopy;  Laterality: N/A;   ECTOPIC PREGNANCY SURGERY     HERNIA REPAIR     TONSILLECTOMY     Patient Active Problem List   Diagnosis Date Noted   Renal lesion, right 10/30/2021   Acute blood loss anemia 10/30/2021   Lower GI bleed 10/27/2021   GI bleeding  05/29/2021   CVA (cerebral vascular accident) (HCC) 07/04/2017    ONSET DATE: 04/2018  REFERRING DIAG: CVA  THERAPY DIAG:  Muscle weakness (generalized)  Other lack of coordination  Rationale for Evaluation and Treatment: Rehabilitation  SUBJECTIVE:   SUBJECTIVE STATEMENT: Pt. Reports that she is doing well today. Pt. Reported that she has been trying the therapy exercises at home but would like to review them to ensure she is doing them correctly.  Pt accompanied by: self  PERTINENT HISTORY: Pt. Is a 89 y.o. female who has a past medical history of CVA, hypertension, type ll DM, chronic anemia, history of diverticular bleed in February 2023. Pt. Has had a recent fall on 02/22/2022 and was found laying on the floor and had been laying there for a couple of days. Pt. Presents with generalized weakness and was referred to OT by her PCP to help with decrease in handwriting skills over the last few years.  PRECAUTIONS: None   WEIGHT BEARING RESTRICTIONS: No  PAIN:  Are you having pain? No  FALLS: Has patient fallen in last 6 months? Yes. Number of falls 1-2  LIVING ENVIRONMENT: Lives with: lives alone Lives in: Independent living facility that has apartments called "[facility]" Stairs: Engineer, structural Has following equipment at home: Dan Humphreys - 2 wheeled, Environmental consultant - 4 wheeled, Wheelchair (power), shower chair, and [employer]  bars  PLOF: Independent  PATIENT GOALS: Desires to improve handwriting so that she is able to write her own checks  OBJECTIVE:   HAND DOMINANCE: Left  ADLs:  Eating: Independent with feeding self Grooming: Independent UB Dressing: Able to put blouse on, does not wear shirt with buttons LB Dressing: Able to dress self with elastic pants, able to put socks and shoes Toileting: Independent Bathing: Pt. Has someone who comes and gives her a shower Tub Shower transfers: Depends on Aid to help get in and out of the walk in shower Equipment: Shower seat with back,  Grab bars, Walk in shower, and bed side commode  IADLs: Shopping: Grand daughter does the grocery shopping however Pt. Reports going with her sometimes Light housekeeping: Pt. States she keeps a clean house and can vacuum from her wheelchair Meal Prep: Meals on wheels brings her food, grand daughter will also help prepare light meals Community mobility: Public transportation Medication management: Independent, uses pill Nature conservation officer: Granddaguther helps pay pills and writes checks Handwriting: Print 25% legible Cursive: 25% legible Pt. Was asked to write name on paper and instead of writing side to side Pt. wrote vertically  MOBILITY STATUS: Needs Assist: Wheelchair  POSTURE COMMENTS:  No Significant postural limitations Sitting balance: WFL  ACTIVITY TOLERANCE: Activity tolerance: WFL  FUNCTIONAL OUTCOME MEASURES: FOTO: 69 TR: 68  UPPER EXTREMITY ROM:    Active ROM Right WFL Left WFL  Shoulder flexion    Shoulder abduction    Shoulder adduction    Shoulder extension    Shoulder internal rotation    Shoulder external rotation    Elbow flexion    Elbow extension    Wrist flexion    Wrist extension    Wrist ulnar deviation    Wrist radial deviation    Wrist pronation    Wrist supination    (Blank rows = not tested)  UPPER EXTREMITY MMT:     MMT Right eval Left eval  Shoulder flexion 4+/5 4/5  Shoulder abduction 4+/5 4/5  Shoulder adduction    Shoulder extension    Shoulder internal rotation    Shoulder external rotation    Middle trapezius    Lower trapezius    Elbow flexion 5/5 4+/5  Elbow extension 5/5 4+/5  Wrist flexion 4+/5 4/5  Wrist extension 4+/5 4/5  Wrist ulnar deviation    Wrist radial deviation    Wrist pronation    Wrist supination    (Blank rows = not tested)  HAND FUNCTION: Grip strength: Right: 40 lbs; Left: 44 lbs, Lateral pinch: Right: 7 lbs, Left: 8 lbs, and 3 point pinch: Right: 6 lbs, Left: 7  lbs  COORDINATION: 9 Hole Peg test: Right: 1 minute 10 sec; Left: 58 sec  SENSATION: Not tested  EDEMA: None  MUSCLE TONE: WFL  COGNITION: Overall cognitive status: Within functional limits for tasks assessed  VISION: Subjective report: Pt. Reports that she wears glasses for most of the time however takes them off sometimes. Baseline vision: Wear glasses most of the time Visual history: cataracts  PERCEPTION: WFL  PRAXIS: WFL   TODAY'S TREATMENT:  DATE: 11/05/2022   Therapeutic Exercise:  Pt. Requested to review theraputty exercises to ensure she was doing them correctly. Pt worked on green thearputty ex. For hand strengthening. Exercises including: gross gripping, gross digit extension, lateral, and 3pt. Pinch strengthening, digit abduction, and thumb opposition. Each exercise was reviewed with the Pt. Again and Pt. Demonstrated understanding.   Self Care/Home Management:   Pt. Worked on Public relations account executive by practicing writing her first name in print on lined paper. Pt. Worked on filling out 2 blank checks with information from instruction sheet including the date, amount, pay to the order of, memo, and signature. Pt. Was challenged to write within the lines and have consistent spacing between each letter.  PATIENT EDUCATION: Education details: OT services, POC, Goals Person educated: Patient Education method: Explanation, Demonstration, and Verbal cues Education comprehension: verbalized understanding  HOME EXERCISE PROGRAM: Will continue ongoing assessment of HEP needs and provide as indicated.   GOALS: Goals reviewed with patient? Yes  SHORT TERM GOALS: Target date: 01/08/2023  Pt. Will improve FOTO score by 2 points to reflect improved perceived function performance in specific ADL/IADL. Baseline: FOTO: 69 TR: 68 Goal status:  INITIAL  LONG TERM GOALS: Target date: 01/19/2023  Pt. Will improve R and L hand FMC skills by 10 seconds of speed to independently manipulate small objects during ADL/IADL tasks. Baseline: Eval: 9 Hole Peg test: Right: 1 minute 10 sec; Left: 58 sec Goal status: INITIAL  2.  Pt. Will improve L shoulder strength by 1 mm grade to be able to sustain LUE elevation while performing ADL/IADL tasks. Baseline: Eval: L: Shoulder flexion: 4/5, Shoulder abduction: 4/5 Goal status: INITIAL  3.  Pt. Will improve L hand FMC/dexterity skills to be able to sign her name on checks with 75% legibility, using writing aids as needed. Baseline: Eval: Print: 25% legibility, Cursive: 25% legibility Goal status: INITIAL  ASSESSMENT:  CLINICAL IMPRESSION:  Pt. Reported that they have been trying the theraputty exercises at home but wanted to go over them again to ensure proper form for the exercises. Pt. Required increased verbal cues to ensure proper form during key pinch, finger extension, and thumb opposition theraputty exercises. Pt. Reported feeling more comfortable with theraputty exercises after reviewing them. Pt. Was provided with an access code for [employer] to a video demonstration of each exercise.  Pt. Was able to print her name with 90% legibility 1/7 times, 75% legibility 5/7 times, 50% legibility 1/7 times.  Pt. Continues to be able to write her name with increased legibility when encouraged to slow down and write it as neatly as possible. Pt. Was able to fill out two blank checks with information on instruction sheet with 50% legibility. Pt. Experiencing difficulties connecting the letters to the top/bottom of the lines. Pt. Required increased time to fill out checks. Pt. Would benefit from skilled OT services to increase [facility] skills in the R and L hand and improve LUE strengthen to increase independence and enhance engagement in ADL/IADL tasks.   PERFORMANCE DEFICITS: in functional skills including  ADLs, IADLs, coordination, strength, and Fine motor control, cognitive skills including problem solving, and psychosocial skills including coping strategies, environmental adaptation, and routines and behaviors.   IMPAIRMENTS: are limiting patient from ADLs and IADLs.   CO-MORBIDITIES: may have co-morbidities  that affects occupational performance. Patient will benefit from skilled OT to address above impairments and improve overall function.  MODIFICATION OR ASSISTANCE TO COMPLETE EVALUATION: Min-Moderate modification of tasks or assist with assess necessary to complete  an evaluation.  OT OCCUPATIONAL PROFILE AND HISTORY: Detailed assessment: Review of records and additional review of physical, cognitive, psychosocial history related to current functional performance.  CLINICAL DECISION MAKING: Moderate - several treatment options, min-mod task modification necessary  REHAB POTENTIAL: Good  EVALUATION COMPLEXITY: Moderate    PLAN:  OT FREQUENCY: 2x/week  OT DURATION: 12 weeks  PLANNED INTERVENTIONS: self care/ADL training, therapeutic exercise, therapeutic activity, neuromuscular re-education, and patient/family education  RECOMMENDED OTHER SERVICES: N/A  CONSULTED AND AGREED WITH PLAN OF CARE: Patient  PLAN FOR NEXT SESSION: Initiate treatment  Olegario Messier, OT 11/06/2022, 8:40 AM

## 2022-11-10 ENCOUNTER — Telehealth: Payer: Self-pay | Admitting: Occupational Therapy

## 2022-11-10 ENCOUNTER — Encounter: Payer: Medicare Other | Admitting: Occupational Therapy

## 2022-11-10 ENCOUNTER — Ambulatory Visit: Payer: Medicare Other | Admitting: Occupational Therapy

## 2022-11-10 NOTE — Telephone Encounter (Signed)
Pt. did not arrive for her OT appointment this afternoon. An attempt was made to follow-up, and reach pt. By phone. A voicemail was left for the Pt.

## 2022-11-11 ENCOUNTER — Ambulatory Visit: Payer: Medicare Other | Admitting: Occupational Therapy

## 2022-11-12 ENCOUNTER — Telehealth: Payer: Self-pay | Admitting: Occupational Therapy

## 2022-11-12 ENCOUNTER — Ambulatory Visit: Payer: Medicare Other | Admitting: Occupational Therapy

## 2022-11-12 NOTE — Telephone Encounter (Signed)
Pt. Did not arrive for her OT appointment today. In a follo-wup phone call with the Pt. She reportsed waiting for transpaortation services for an hour in the lobby of her apartment complex. She reported that transportation services never showed up, even after receiving confirmation from them that they would be there. The "No show" policy was reviewed with the Pt., and she was encouraged to call our receptionist to cancel an appointment instead of "No showing". Pt. Continues to be very motivated.

## 2022-11-18 ENCOUNTER — Ambulatory Visit: Payer: Medicare Other | Admitting: Occupational Therapy

## 2022-11-20 ENCOUNTER — Ambulatory Visit: Payer: Medicare Other | Attending: Family Medicine | Admitting: Occupational Therapy

## 2022-11-20 ENCOUNTER — Encounter: Payer: Self-pay | Admitting: Occupational Therapy

## 2022-11-20 DIAGNOSIS — M6281 Muscle weakness (generalized): Secondary | ICD-10-CM | POA: Diagnosis present

## 2022-11-20 DIAGNOSIS — R278 Other lack of coordination: Secondary | ICD-10-CM | POA: Insufficient documentation

## 2022-11-20 NOTE — Therapy (Signed)
OUTPATIENT OCCUPATIONAL THERAPY NEURO TREATMENT NOTE  Patient Name: Susan [name] MRN: [id number] DOB:[birth year], 89 y.o., female Today's Date: 11/20/2022  PCP: [name] REFERRING PROVIDER: [name]  END OF SESSION:  OT End of Session - 11/20/22 1152     Visit Number 4    Number of Visits 24    Date for OT Re-Evaluation 01/19/23    OT Start Time 1148    OT Stop Time 1230    OT Time Calculation (min) 42 min    Equipment Utilized During Treatment Wheelchair    Activity Tolerance Patient tolerated treatment well    Behavior During Therapy [facility] for tasks assessed/performed             Past Medical History:  Diagnosis Date   Anemia    Balance problem    Diabetes mellitus without complication (HCC)    Diverticulosis    Heart murmur    Hypercholesteremia    Hypertension    Stroke [facility])    TIA 2006   TIA (transient ischemic attack) 2006   Past Surgical History:  Procedure Laterality Date   CATARACT EXTRACTION W/PHACO Left 05/22/2015   Procedure: CATARACT EXTRACTION PHACO AND INTRAOCULAR LENS PLACEMENT (IOC);  Surgeon: Galen Manila, MD;  Location: [facility] ORS;  Service: Ophthalmology;  Laterality: Left;  [place]: 01:07.9 AP%: 23.0 CDE: 15.62 Lot# [id number]   CATARACT EXTRACTION W/PHACO Right 06/12/2015   Procedure: CATARACT EXTRACTION PHACO AND INTRAOCULAR LENS PLACEMENT (IOC);  Surgeon: Galen Manila, MD;  Location: [facility] ORS;  Service: Ophthalmology;  Laterality: Right;  [place] 00:57 AP% 17.1 CDE 9.86 fluid pack lot # [id number]   COLON SURGERY     resection   COLONOSCOPY WITH PROPOFOL N/A 10/29/2021   Procedure: COLONOSCOPY WITH PROPOFOL;  Surgeon: Regis Bill, MD;  Location: [facility] ENDOSCOPY;  Service: Endoscopy;  Laterality: N/A;   ECTOPIC PREGNANCY SURGERY     HERNIA REPAIR     TONSILLECTOMY     Patient Active Problem List   Diagnosis Date Noted   Renal lesion, right 10/30/2021   Acute blood loss anemia 10/30/2021   Lower GI bleed 10/27/2021   GI bleeding  05/29/2021   CVA (cerebral vascular accident) (HCC) 07/04/2017    ONSET DATE: 04/2018  REFERRING DIAG: CVA  THERAPY DIAG:  Muscle weakness (generalized)  Other lack of coordination  Rationale for Evaluation and Treatment: Rehabilitation  SUBJECTIVE:   SUBJECTIVE STATEMENT: Pt. Reports she has been using flat marbles in her theraputty.  Pt accompanied by: self  PERTINENT HISTORY: Pt. Is a 89 y.o. female who has a past medical history of CVA, hypertension, type ll DM, chronic anemia, history of diverticular bleed in February 2023. Pt. Has had a recent fall on 02/22/2022 and was found laying on the floor and had been laying there for a couple of days. Pt. Presents with generalized weakness and was referred to OT by her PCP to help with decrease in handwriting skills over the last few years.  PRECAUTIONS: None   WEIGHT BEARING RESTRICTIONS: No  PAIN:  Are you having pain? No  FALLS: Has patient fallen in last 6 months? Yes. Number of falls 1-2  LIVING ENVIRONMENT: Lives with: lives alone Lives in: Independent living facility that has apartments called "[facility]" Stairs: Engineer, structural Has following equipment at home: Dan Humphreys - 2 wheeled, Environmental consultant - 4 wheeled, Wheelchair (power), shower chair, and Grab bars  PLOF: Independent  PATIENT GOALS: Desires to improve handwriting so that she is able to write her own checks  OBJECTIVE:   HAND DOMINANCE: Left  ADLs:  Eating: Independent with feeding self Grooming: Independent UB Dressing: Able to put blouse on, does not wear shirt with buttons LB Dressing: Able to dress self with elastic pants, able to put socks and shoes Toileting: Independent Bathing: Pt. Has someone who comes and gives her a shower Tub Shower transfers: Depends on Aid to help get in and out of the walk in shower Equipment: Shower seat with back, Grab bars, Walk in shower, and bed side commode  IADLs: Shopping: Grand daughter does the grocery shopping however  Pt. Reports going with her sometimes Light housekeeping: Pt. States she keeps a clean house and can vacuum from her wheelchair Meal Prep: Meals on wheels brings her food, grand daughter will also help prepare light meals Community mobility: Public transportation Medication management: Independent, uses pill Nature conservation officer: [place]uther helps pay pills and writes checks Handwriting: Print 25% legible Cursive: 25% legible Pt. Was asked to write name on paper and instead of writing side to side Pt. wrote vertically  MOBILITY STATUS: Needs Assist: Wheelchair  POSTURE COMMENTS:  No Significant postural limitations Sitting balance: WFL  ACTIVITY TOLERANCE: Activity tolerance: WFL  FUNCTIONAL OUTCOME MEASURES: FOTO: 69 TR: 68  UPPER EXTREMITY ROM:    Active ROM Right WFL Left WFL  Shoulder flexion    Shoulder abduction    Shoulder adduction    Shoulder extension    Shoulder internal rotation    Shoulder external rotation    Elbow flexion    Elbow extension    Wrist flexion    Wrist extension    Wrist ulnar deviation    Wrist radial deviation    Wrist pronation    Wrist supination    (Blank rows = not tested)  UPPER EXTREMITY MMT:     MMT Right eval Left eval  Shoulder flexion 4+/5 4/5  Shoulder abduction 4+/5 4/5  Shoulder adduction    Shoulder extension    Shoulder internal rotation    Shoulder external rotation    Middle trapezius    Lower trapezius    Elbow flexion 5/5 4+/5  Elbow extension 5/5 4+/5  Wrist flexion 4+/5 4/5  Wrist extension 4+/5 4/5  Wrist ulnar deviation    Wrist radial deviation    Wrist pronation    Wrist supination    (Blank rows = not tested)  HAND FUNCTION: Grip strength: Right: 40 lbs; Left: 44 lbs, Lateral pinch: Right: 7 lbs, Left: 8 lbs, and 3 point pinch: Right: 6 lbs, Left: 7 lbs  COORDINATION: 9 Hole Peg test: Right: 1 minute 10 sec; Left: 58 sec  SENSATION: Not tested  EDEMA: None  MUSCLE TONE:  WFL  COGNITION: Overall cognitive status: Within functional limits for tasks assessed  VISION: Subjective report: Pt. Reports that she wears glasses for most of the time however takes them off sometimes. Baseline vision: Wear glasses most of the time Visual history: cataracts  PERCEPTION: WFL  PRAXIS: WFL   TODAY'S TREATMENT:  DATE: 11/20/2022    Neuromuscular Re-education:  Pt performed [facility] tasks using the Grooved pegboard. Pt worked on grasping the grooved pegs from a horizontal position and worked on Comptroller, moving the pegs to a vertical position in the hand to prepare for placing them in the grooved slot. Initially utilizing both hands to achieve appropriate orientation, with cues and increased time completes manipulation in hand. Alternates L and R hand x2 trials. Removed pegs and stored up to 5 in palm before dropping. Increased time to complete with non-dominant R hand, cues to minimize proximal compensatory movements.    Therapeutic Exercise: Pt requested to review new marbles theraputty exercise with her home red theraputty. Good technique and use of L hand to locate/remove x10 flat marbles.   Self Care/Home Management:  Pt worked on Psychologist, occupational by tracing 4 leaf clover designs on paper, initially traces with ~25% accuracy improving to 90% accuracy over 20 trials. Completed x3 mazes focused on staying between the lines, reports difficulty managing turns. Pt wrote name with 50% legibility, challenged to write within the lines and have consistent spacing between each letter. Continues to write name with paper turned sideways.   PATIENT EDUCATION: Education details: OT services, POC, Goals Person educated: Patient Education method: Explanation, Demonstration, and Verbal cues Education comprehension: verbalized understanding  HOME  EXERCISE PROGRAM: Will continue ongoing assessment of HEP needs and provide as indicated.   GOALS: Goals reviewed with patient? Yes  SHORT TERM GOALS: Target date: 01/08/2023  Pt. Will improve FOTO score by 2 points to reflect improved perceived function performance in specific ADL/IADL. Baseline: FOTO: 69 TR: 68 Goal status: INITIAL  LONG TERM GOALS: Target date: 01/19/2023  Pt. Will improve R and L hand FMC skills by 10 seconds of speed to independently manipulate small objects during ADL/IADL tasks. Baseline: Eval: 9 Hole Peg test: Right: 1 minute 10 sec; Left: 58 sec Goal status: INITIAL  2.  Pt. Will improve L shoulder strength by 1 mm grade to be able to sustain LUE elevation while performing ADL/IADL tasks. Baseline: Eval: L: Shoulder flexion: 4/5, Shoulder abduction: 4/5 Goal status: INITIAL  3.  Pt. Will improve L hand FMC/dexterity skills to be able to sign her name on checks with 75% legibility, using writing aids as needed. Baseline: Eval: Print: 25% legibility, Cursive: 25% legibility Goal status: INITIAL  ASSESSMENT:  CLINICAL IMPRESSION:  Pt completed pre-writing exercises tracing shapes and staying between the lines to complete simple mazes. Writes name with 50% legiblity, decreasing as pt fatigues to 25% legibility. Reviewed theraputty exercises with pt demoing good recall. Cues to avoid R shoulder hiking to complete grooved peg board. Pt would benefit from skilled OT services to increase [facility] skills in the R and L hand and improve LUE strengthen to increase independence and enhance engagement in ADL/IADL tasks.   PERFORMANCE DEFICITS: in functional skills including ADLs, IADLs, coordination, strength, and Fine motor control, cognitive skills including problem solving, and psychosocial skills including coping strategies, environmental adaptation, and routines and behaviors.   IMPAIRMENTS: are limiting patient from ADLs and IADLs.   CO-MORBIDITIES: may have  co-morbidities  that affects occupational performance. Patient will benefit from skilled OT to address above impairments and improve overall function.  MODIFICATION OR ASSISTANCE TO COMPLETE EVALUATION: Min-Moderate modification of tasks or assist with assess necessary to complete an evaluation.  OT OCCUPATIONAL PROFILE AND HISTORY: Detailed assessment: Review of records and additional review of physical, cognitive, psychosocial history related to current functional performance.  CLINICAL  DECISION MAKING: Moderate - several treatment options, min-mod task modification necessary  REHAB POTENTIAL: Good  EVALUATION COMPLEXITY: Moderate    PLAN:  OT FREQUENCY: 2x/week  OT DURATION: 12 weeks  PLANNED INTERVENTIONS: self care/ADL training, therapeutic exercise, therapeutic activity, neuromuscular re-education, and patient/family education  RECOMMENDED OTHER SERVICES: N/A  CONSULTED AND AGREED WITH PLAN OF CARE: Patient  PLAN FOR NEXT SESSION: Initiate treatment   Kathie Dike, M.S. OTR/L  11/20/22, 11:53 AM  ascom [phone]  Presley Raddle, OT 11/20/2022, 11:52 AM

## 2022-11-25 ENCOUNTER — Ambulatory Visit: Payer: Medicare Other | Admitting: Occupational Therapy

## 2022-11-25 DIAGNOSIS — M6281 Muscle weakness (generalized): Secondary | ICD-10-CM | POA: Diagnosis not present

## 2022-11-25 DIAGNOSIS — R278 Other lack of coordination: Secondary | ICD-10-CM

## 2022-11-25 NOTE — Therapy (Signed)
OUTPATIENT OCCUPATIONAL THERAPY NEURO TREATMENT NOTE  Patient Name: Susan [name] MRN: [id number] DOB:[birth year], 89 y.o., female Today's Date: 11/25/2022  PCP: [name] REFERRING PROVIDER: [name]  END OF SESSION:  OT End of Session - 11/25/22 1505     Visit Number 5    Number of Visits 24    Date for OT Re-Evaluation 01/19/23    OT Start Time 1445    OT Stop Time 1530    OT Time Calculation (min) 45 min    Activity Tolerance Patient tolerated treatment well    Behavior During Therapy [facility] for tasks assessed/performed             Past Medical History:  Diagnosis Date   Anemia    Balance problem    Diabetes mellitus without complication (HCC)    Diverticulosis    Heart murmur    Hypercholesteremia    Hypertension    Stroke [facility])    TIA 2006   TIA (transient ischemic attack) 2006   Past Surgical History:  Procedure Laterality Date   CATARACT EXTRACTION W/PHACO Left 05/22/2015   Procedure: CATARACT EXTRACTION PHACO AND INTRAOCULAR LENS PLACEMENT (IOC);  Surgeon: Galen Manila, MD;  Location: [facility] ORS;  Service: Ophthalmology;  Laterality: Left;  [place]: 01:07.9 AP%: 23.0 CDE: 15.62 Lot# [id number]   CATARACT EXTRACTION W/PHACO Right 06/12/2015   Procedure: CATARACT EXTRACTION PHACO AND INTRAOCULAR LENS PLACEMENT (IOC);  Surgeon: Galen Manila, MD;  Location: [facility] ORS;  Service: Ophthalmology;  Laterality: Right;  [place] 00:57 AP% 17.1 CDE 9.86 fluid pack lot # [id number]   COLON SURGERY     resection   COLONOSCOPY WITH PROPOFOL N/A 10/29/2021   Procedure: COLONOSCOPY WITH PROPOFOL;  Surgeon: Regis Bill, MD;  Location: [facility] ENDOSCOPY;  Service: Endoscopy;  Laterality: N/A;   ECTOPIC PREGNANCY SURGERY     HERNIA REPAIR     TONSILLECTOMY     Patient Active Problem List   Diagnosis Date Noted   Renal lesion, right 10/30/2021   Acute blood loss anemia 10/30/2021   Lower GI bleed 10/27/2021   GI bleeding 05/29/2021   CVA (cerebral vascular accident) (HCC)  07/04/2017    ONSET DATE: 04/2018  REFERRING DIAG: CVA  THERAPY DIAG:  Muscle weakness (generalized)  Other lack of coordination  Rationale for Evaluation and Treatment: Rehabilitation  SUBJECTIVE:   SUBJECTIVE STATEMENT: Pt. reports that she feels as though she was having an episode of low blood sugar on her way to therapy today. Pt accompanied by: self  PERTINENT HISTORY: Pt. Is a 89 y.o. female who has a past medical history of CVA, hypertension, type ll DM, chronic anemia, history of diverticular bleed in February 2023. Pt. Has had a recent fall on 02/22/2022 and was found laying on the floor and had been laying there for a couple of days. Pt. Presents with generalized weakness and was referred to OT by her PCP to help with decrease in handwriting skills over the last few years.  PRECAUTIONS: None   WEIGHT BEARING RESTRICTIONS: No  PAIN:  Are you having pain? No  FALLS: Has patient fallen in last 6 months? Yes. Number of falls 1-2  LIVING ENVIRONMENT: Lives with: lives alone Lives in: Independent living facility that has apartments called "[facility]" Stairs: Engineer, structural Has following equipment at home: Dan Humphreys - 2 wheeled, Environmental consultant - 4 wheeled, Wheelchair (power), shower chair, and Grab bars  PLOF: Independent  PATIENT GOALS: Desires to improve handwriting so that she is able to write her own  checks  OBJECTIVE:   HAND DOMINANCE: Left  ADLs:  Eating: Independent with feeding self Grooming: Independent UB Dressing: Able to put blouse on, does not wear shirt with buttons LB Dressing: Able to dress self with elastic pants, able to put socks and shoes Toileting: Independent Bathing: Pt. Has someone who comes and gives her a shower Tub Shower transfers: Depends on Aid to help get in and out of the walk in shower Equipment: Shower seat with back, Grab bars, Walk in shower, and bed side commode  IADLs: Shopping: Grand daughter does the grocery shopping however Pt.  Reports going with her sometimes Light housekeeping: Pt. States she keeps a clean house and can vacuum from her wheelchair Meal Prep: Meals on wheels brings her food, grand daughter will also help prepare light meals Community mobility: Public transportation Medication management: Independent, uses pill Nature conservation officer: [place] helps pay pills and writes checks Handwriting: Print 25% legible Cursive: 25% legible Pt. Was asked to write name on paper and instead of writing side to side Pt. wrote vertically  MOBILITY STATUS: Needs Assist: Wheelchair  POSTURE COMMENTS:  No Significant postural limitations Sitting balance: WFL  ACTIVITY TOLERANCE: Activity tolerance: WFL  FUNCTIONAL OUTCOME MEASURES: FOTO: 69 TR: 68  UPPER EXTREMITY ROM:    Active ROM Right WFL Left WFL  Shoulder flexion    Shoulder abduction    Shoulder adduction    Shoulder extension    Shoulder internal rotation    Shoulder external rotation    Elbow flexion    Elbow extension    Wrist flexion    Wrist extension    Wrist ulnar deviation    Wrist radial deviation    Wrist pronation    Wrist supination    (Blank rows = not tested)  UPPER EXTREMITY MMT:     MMT Right eval Left eval  Shoulder flexion 4+/5 4/5  Shoulder abduction 4+/5 4/5  Shoulder adduction    Shoulder extension    Shoulder internal rotation    Shoulder external rotation    Middle trapezius    Lower trapezius    Elbow flexion 5/5 4+/5  Elbow extension 5/5 4+/5  Wrist flexion 4+/5 4/5  Wrist extension 4+/5 4/5  Wrist ulnar deviation    Wrist radial deviation    Wrist pronation    Wrist supination    (Blank rows = not tested)  HAND FUNCTION: Grip strength: Right: 40 lbs; Left: 44 lbs, Lateral pinch: Right: 7 lbs, Left: 8 lbs, and 3 point pinch: Right: 6 lbs, Left: 7 lbs  COORDINATION: 9 Hole Peg test: Right: 1 minute 10 sec; Left: 58 sec  SENSATION: Not tested  EDEMA: None  MUSCLE TONE:  WFL  COGNITION: Overall cognitive status: Within functional limits for tasks assessed  VISION: Subjective report: Pt. Reports that she wears glasses for most of the time however takes them off sometimes. Baseline vision: Wear glasses most of the time Visual history: cataracts  PERCEPTION: WFL  PRAXIS: WFL   TODAY'S TREATMENT:  DATE: 11/25/2022   Neuromuscular Re-education:   Pt. worked on  bilateral [facility] skills grasping 1" sticks, 1/4" collars, and 1/4" washers. Pt. worked on storing the objects in the palm, and translatory skills moving the items from the palm of the hand to the tip of the 2nd digit, and thumb. Pt. worked on removing the pegs using bilateral alternating hand patterns.   PATIENT EDUCATION: Education details: OT services, POC, Goals Person educated: Patient Education method: Explanation, Demonstration, and Verbal cues Education comprehension: verbalized understanding  HOME EXERCISE PROGRAM: Will continue ongoing assessment of HEP needs and provide as indicated.   GOALS: Goals reviewed with patient? Yes  SHORT TERM GOALS: Target date: 01/08/2023  Pt. Will improve FOTO score by 2 points to reflect improved perceived function performance in specific ADL/IADL. Baseline: FOTO: 69 TR: 68 Goal status: INITIAL  LONG TERM GOALS: Target date: 01/19/2023  Pt. Will improve R and L hand FMC skills by 10 seconds of speed to independently manipulate small objects during ADL/IADL tasks. Baseline: Eval: 9 Hole Peg test: Right: 1 minute 10 sec; Left: 58 sec Goal status: INITIAL  2.  Pt. Will improve L shoulder strength by 1 mm grade to be able to sustain LUE elevation while performing ADL/IADL tasks. Baseline: Eval: L: Shoulder flexion: 4/5, Shoulder abduction: 4/5 Goal status: INITIAL  3.  Pt. Will improve L hand FMC/dexterity skills to be able to  sign her name on checks with 75% legibility, using writing aids as needed. Baseline: Eval: Print: 25% legibility, Cursive: 25% legibility Goal status: INITIAL  ASSESSMENT:  CLINICAL IMPRESSION:  Pt. reports having an episode of low blood sugar while riding to therapy this afternoon. Pt. was provided with crackers, and peanut butter. Pt.'s BP 186/86 HR 90, 172/70 HR 85. Once feeling better, Pt. was agreeable to, and able to focus on left hand [facility] skills, and was able to perform translatory movements with verbal cues, and cues for visual demonstration. Pt. continues to benefit from skilled OT services to increase [facility] skills in the R and L hand and improve LUE strengthen to increase independence and enhance engagement in ADL/IADL tasks.   PERFORMANCE DEFICITS: in functional skills including ADLs, IADLs, coordination, strength, and Fine motor control, cognitive skills including problem solving, and psychosocial skills including coping strategies, environmental adaptation, and routines and behaviors.   IMPAIRMENTS: are limiting patient from ADLs and IADLs.   CO-MORBIDITIES: may have co-morbidities  that affects occupational performance. Patient will benefit from skilled OT to address above impairments and improve overall function.  MODIFICATION OR ASSISTANCE TO COMPLETE EVALUATION: Min-Moderate modification of tasks or assist with assess necessary to complete an evaluation.  OT OCCUPATIONAL PROFILE AND HISTORY: Detailed assessment: Review of records and additional review of physical, cognitive, psychosocial history related to current functional performance.  CLINICAL DECISION MAKING: Moderate - several treatment options, min-mod task modification necessary  REHAB POTENTIAL: Good  EVALUATION COMPLEXITY: Moderate    PLAN:  OT FREQUENCY: 2x/week  OT DURATION: 12 weeks  PLANNED INTERVENTIONS: self care/ADL training, therapeutic exercise, therapeutic activity, neuromuscular re-education, and  patient/family education  RECOMMENDED OTHER SERVICES: N/A  CONSULTED AND AGREED WITH PLAN OF CARE: Patient  PLAN FOR NEXT SESSION: Initiate treatment  Olegario Messier, MS, OTR/L  11/25/2022, 3:12 PM

## 2022-11-27 ENCOUNTER — Ambulatory Visit: Payer: Medicare Other | Admitting: Occupational Therapy

## 2022-12-01 ENCOUNTER — Ambulatory Visit: Payer: Medicare Other | Admitting: Occupational Therapy

## 2022-12-01 DIAGNOSIS — R278 Other lack of coordination: Secondary | ICD-10-CM

## 2022-12-01 DIAGNOSIS — M6281 Muscle weakness (generalized): Secondary | ICD-10-CM | POA: Diagnosis not present

## 2022-12-01 NOTE — Therapy (Addendum)
OUTPATIENT OCCUPATIONAL THERAPY NEURO TREATMENT NOTE  Patient Name: Susan [name] MRN: [id number] DOB:[birth year], 89 y.o., female Today's Date: 12/01/2022  PCP: [name] REFERRING PROVIDER: [name]  END OF SESSION:  OT End of Session - 12/01/22 1406     Visit Number 6    Number of Visits 24    Date for OT Re-Evaluation 01/19/23    OT Start Time 1445    OT Stop Time 1530    OT Time Calculation (min) 45 min    Activity Tolerance Patient tolerated treatment well    Behavior During Therapy [facility] for tasks assessed/performed             Past Medical History:  Diagnosis Date   Anemia    Balance problem    Diabetes mellitus without complication (HCC)    Diverticulosis    Heart murmur    Hypercholesteremia    Hypertension    Stroke [facility])    TIA 2006   TIA (transient ischemic attack) 2006   Past Surgical History:  Procedure Laterality Date   CATARACT EXTRACTION W/PHACO Left 05/22/2015   Procedure: CATARACT EXTRACTION PHACO AND INTRAOCULAR LENS PLACEMENT (IOC);  Surgeon: Galen Manila, MD;  Location: [facility] ORS;  Service: Ophthalmology;  Laterality: Left;  [place]: 01:07.9 AP%: 23.0 CDE: 15.62 Lot# [id number]   CATARACT EXTRACTION W/PHACO Right 06/12/2015   Procedure: CATARACT EXTRACTION PHACO AND INTRAOCULAR LENS PLACEMENT (IOC);  Surgeon: Galen Manila, MD;  Location: [facility] ORS;  Service: Ophthalmology;  Laterality: Right;  [place] 00:57 AP% 17.1 CDE 9.86 fluid pack lot # [id number]   COLON SURGERY     resection   COLONOSCOPY WITH PROPOFOL N/A 10/29/2021   Procedure: COLONOSCOPY WITH PROPOFOL;  Surgeon: Regis Bill, MD;  Location: [facility] ENDOSCOPY;  Service: Endoscopy;  Laterality: N/A;   ECTOPIC PREGNANCY SURGERY     HERNIA REPAIR     TONSILLECTOMY     Patient Active Problem List   Diagnosis Date Noted   Renal lesion, right 10/30/2021   Acute blood loss anemia 10/30/2021   Lower GI bleed 10/27/2021   GI bleeding 05/29/2021   CVA (cerebral vascular accident) (HCC)  07/04/2017    ONSET DATE: 04/2018  REFERRING DIAG: CVA  THERAPY DIAG:  Muscle weakness (generalized)  Other lack of coordination  Rationale for Evaluation and Treatment: Rehabilitation  SUBJECTIVE:   SUBJECTIVE STATEMENT: Pt. reports that she is feeling better toady, and that her refrigerator has been fixed. Pt accompanied by: self  PERTINENT HISTORY: Pt. Is a 89 y.o. female who has a past medical history of CVA, hypertension, type ll DM, chronic anemia, history of diverticular bleed in February 2023. Pt. Has had a recent fall on 02/22/2022 and was found laying on the floor and had been laying there for a couple of days. Pt. Presents with generalized weakness and was referred to OT by her PCP to help with decrease in handwriting skills over the last few years.  PRECAUTIONS: None   WEIGHT BEARING RESTRICTIONS: No  PAIN:  Are you having pain? No  FALLS: Has patient fallen in last 6 months? Yes. Number of falls 1-2  LIVING ENVIRONMENT: Lives with: lives alone Lives in: Independent living facility that has apartments called "[facility]" Stairs: Engineer, structural Has following equipment at home: Dan Humphreys - 2 wheeled, Environmental consultant - 4 wheeled, Wheelchair (power), shower chair, and Grab bars  PLOF: Independent  PATIENT GOALS: Desires to improve handwriting so that she is able to write her own checks  OBJECTIVE:   HAND DOMINANCE:  Left  ADLs:  Eating: Independent with feeding self Grooming: Independent UB Dressing: Able to put blouse on, does not wear shirt with buttons LB Dressing: Able to dress self with elastic pants, able to put socks and shoes Toileting: Independent Bathing: Pt. Has someone who comes and gives her a shower Tub Shower transfers: Depends on Aid to help get in and out of the walk in shower Equipment: Shower seat with back, Grab bars, Walk in shower, and bed side commode  IADLs: Shopping: Grand daughter does the grocery shopping however Pt. Reports going with her  sometimes Light housekeeping: Pt. States she keeps a clean house and can vacuum from her wheelchair Meal Prep: Meals on wheels brings her food, grand daughter will also help prepare light meals Community mobility: Public transportation Medication management: Independent, uses pill Nature conservation officer: [place] helps pay pills and writes checks Handwriting: Print 25% legible Cursive: 25% legible Pt. Was asked to write name on paper and instead of writing side to side Pt. wrote vertically  MOBILITY STATUS: Needs Assist: Wheelchair  POSTURE COMMENTS:  No Significant postural limitations Sitting balance: WFL  ACTIVITY TOLERANCE: Activity tolerance: WFL  FUNCTIONAL OUTCOME MEASURES: FOTO: 69 TR: 68  UPPER EXTREMITY ROM:    Active ROM Right WFL Left WFL  Shoulder flexion    Shoulder abduction    Shoulder adduction    Shoulder extension    Shoulder internal rotation    Shoulder external rotation    Elbow flexion    Elbow extension    Wrist flexion    Wrist extension    Wrist ulnar deviation    Wrist radial deviation    Wrist pronation    Wrist supination    (Blank rows = not tested)  UPPER EXTREMITY MMT:     MMT Right eval Left eval  Shoulder flexion 4+/5 4/5  Shoulder abduction 4+/5 4/5  Shoulder adduction    Shoulder extension    Shoulder internal rotation    Shoulder external rotation    Middle trapezius    Lower trapezius    Elbow flexion 5/5 4+/5  Elbow extension 5/5 4+/5  Wrist flexion 4+/5 4/5  Wrist extension 4+/5 4/5  Wrist ulnar deviation    Wrist radial deviation    Wrist pronation    Wrist supination    (Blank rows = not tested)  HAND FUNCTION: Grip strength: Right: 40 lbs; Left: 44 lbs, Lateral pinch: Right: 7 lbs, Left: 8 lbs, and 3 point pinch: Right: 6 lbs, Left: 7 lbs  COORDINATION: 9 Hole Peg test: Right: 1 minute 10 sec; Left: 58 sec  SENSATION: Not tested  EDEMA: None  MUSCLE TONE: WFL  COGNITION: Overall  cognitive status: Within functional limits for tasks assessed  VISION: Subjective report: Pt. Reports that she wears glasses for most of the time however takes them off sometimes. Baseline vision: Wear glasses most of the time Visual history: cataracts  PERCEPTION: WFL  PRAXIS: WFL   TODAY'S TREATMENT:  DATE: 12/01/2022   Neuromuscular Re-education:   Pt. performed FMC tasks using the Grooved pegboard. Pt. worked on grasping the grooved pegs from a horizontal position, and moving the pegs to a vertical position in the hand to prepare for placing them in the grooved slot.  Pt. worked on translatory movements moving the grooved pegs through her hand from the palm to the tip of the 2nd digit, and thumb.  Pt. worked on controlling dropping the 1" grooved pegs one at a time from the ulnar aspect of the the palm.  Therapeutic Ex.:   Pt. performed bilateral gross gripping with a gross grip strengthener. Pt. worked on sustaining grip while grasping pegs and reaching at various heights. The gripper was set to 17.9 # of grip strength resistance. Pt. worked on pinch strengthening in the bilateral hand for lateral, and 3pt. pinch using yellow, red, green, and blue resistive clips. Pt. worked on placing the clips at various vertical and horizontal angles. Tactile and verbal cues were required for eliciting the desired movement.    PATIENT EDUCATION: Education details: OT services, POC, Goals Person educated: Patient Education method: Explanation, Demonstration, and Verbal cues Education comprehension: verbalized understanding  HOME EXERCISE PROGRAM: Will continue ongoing assessment of HEP needs and provide as indicated.   GOALS: Goals reviewed with patient? Yes  SHORT TERM GOALS: Target date: 01/08/2023  Pt. Will improve FOTO score by 2 points to reflect improved  perceived function performance in specific ADL/IADL. Baseline: FOTO: 69 TR: 68 Goal status: INITIAL  LONG TERM GOALS: Target date: 01/19/2023  Pt. Will improve R and L hand FMC skills by 10 seconds of speed to independently manipulate small objects during ADL/IADL tasks. Baseline: Eval: 9 Hole Peg test: Right: 1 minute 10 sec; Left: 58 sec Goal status: INITIAL  2.  Pt. Will improve L shoulder strength by 1 mm grade to be able to sustain LUE elevation while performing ADL/IADL tasks. Baseline: Eval: L: Shoulder flexion: 4/5, Shoulder abduction: 4/5 Goal status: INITIAL  3.  Pt. Will improve L hand FMC/dexterity skills to be able to sign her name on checks with 75% legibility, using writing aids as needed. Baseline: Eval: Print: 25% legibility, Cursive: 25% legibility Goal status: INITIAL  ASSESSMENT:  CLINICAL IMPRESSION:  Pt. is tolerating bilateral FMC tasks, grip strengthening, and pinch strengthening exercises well, however requires cues for hand position, and technique.  Pt. requires visual cues, verbal cues, and cues for visual demonstration. Pt. continues to benefit from skilled OT services to increase [facility] skills in the R and L hand and improve LUE strengthen to increase independence and enhance engagement in ADL/IADL tasks.   PERFORMANCE DEFICITS: in functional skills including ADLs, IADLs, coordination, strength, and Fine motor control, cognitive skills including problem solving, and psychosocial skills including coping strategies, environmental adaptation, and routines and behaviors.   IMPAIRMENTS: are limiting patient from ADLs and IADLs.   CO-MORBIDITIES: may have co-morbidities  that affects occupational performance. Patient will benefit from skilled OT to address above impairments and improve overall function.  MODIFICATION OR ASSISTANCE TO COMPLETE EVALUATION: Min-Moderate modification of tasks or assist with assess necessary to complete an evaluation.  OT OCCUPATIONAL  PROFILE AND HISTORY: Detailed assessment: Review of records and additional review of physical, cognitive, psychosocial history related to current functional performance.  CLINICAL DECISION MAKING: Moderate - several treatment options, min-mod task modification necessary  REHAB POTENTIAL: Good  EVALUATION COMPLEXITY: Moderate    PLAN:  OT FREQUENCY: 2x/week  OT DURATION: 12 weeks  PLANNED  INTERVENTIONS: self care/ADL training, therapeutic exercise, therapeutic activity, neuromuscular re-education, and patient/family education  RECOMMENDED OTHER SERVICES: N/A  CONSULTED AND AGREED WITH PLAN OF CARE: Patient  PLAN FOR NEXT SESSION: Initiate treatment  Olegario Messier, MS, OTR/L  12/01/2022, 2:32 PM

## 2022-12-03 ENCOUNTER — Ambulatory Visit: Payer: Medicare Other

## 2022-12-03 DIAGNOSIS — R278 Other lack of coordination: Secondary | ICD-10-CM

## 2022-12-03 DIAGNOSIS — M6281 Muscle weakness (generalized): Secondary | ICD-10-CM | POA: Diagnosis not present

## 2022-12-03 NOTE — Therapy (Signed)
OUTPATIENT OCCUPATIONAL THERAPY NEURO TREATMENT NOTE  Patient Name: Susan [name] MRN: [id number] DOB:10-02-1926, 89 y.o., female Today's Date: 12/03/2022  PCP: [name] REFERRING PROVIDER: [name]  END OF SESSION:  OT End of Session - 12/03/22 1942     Visit Number 7    Number of Visits 24    Date for OT Re-Evaluation 01/19/23    OT Start Time 1400    OT Stop Time 1445    OT Time Calculation (min) 45 min    Equipment Utilized During Treatment Wheelchair    Activity Tolerance Patient tolerated treatment well    Behavior During Therapy [facility] for tasks assessed/performed            Past Medical History:  Diagnosis Date   Anemia    Balance problem    Diabetes mellitus without complication (HCC)    Diverticulosis    Heart murmur    Hypercholesteremia    Hypertension    Stroke [facility])    TIA 2006   TIA (transient ischemic attack) 2006   Past Surgical History:  Procedure Laterality Date   CATARACT EXTRACTION W/PHACO Left 05/22/2015   Procedure: CATARACT EXTRACTION PHACO AND INTRAOCULAR LENS PLACEMENT (IOC);  Surgeon: Galen Manila, MD;  Location: [facility] ORS;  Service: Ophthalmology;  Laterality: Left;  [place]: 01:07.9 AP%: 23.0 CDE: 15.62 Lot# [id number]   CATARACT EXTRACTION W/PHACO Right 06/12/2015   Procedure: CATARACT EXTRACTION PHACO AND INTRAOCULAR LENS PLACEMENT (IOC);  Surgeon: Galen Manila, MD;  Location: [facility] ORS;  Service: Ophthalmology;  Laterality: Right;  [place] 00:57 AP% 17.1 CDE 9.86 fluid pack lot # [id number]   COLON SURGERY     resection   COLONOSCOPY WITH PROPOFOL N/A 10/29/2021   Procedure: COLONOSCOPY WITH PROPOFOL;  Surgeon: Regis Bill, MD;  Location: [facility] ENDOSCOPY;  Service: Endoscopy;  Laterality: N/A;   ECTOPIC PREGNANCY SURGERY     HERNIA REPAIR     TONSILLECTOMY     Patient Active Problem List   Diagnosis Date Noted   Renal lesion, right 10/30/2021   Acute blood loss anemia 10/30/2021   Lower GI bleed 10/27/2021   GI bleeding  05/29/2021   CVA (cerebral vascular accident) (HCC) 07/04/2017    ONSET DATE: 04/2018  REFERRING DIAG: CVA  THERAPY DIAG:  Muscle weakness (generalized)  Other lack of coordination  Rationale for Evaluation and Treatment: Rehabilitation  SUBJECTIVE:   SUBJECTIVE STATEMENT: Pt. reports that she is doing well today. Pt accompanied by: self  PERTINENT HISTORY: Pt. Is a 89 y.o. female who has a past medical history of CVA, hypertension, type ll DM, chronic anemia, history of diverticular bleed in February 2023. Pt. Has had a recent fall on 02/22/2022 and was found laying on the floor and had been laying there for a couple of days. Pt. Presents with generalized weakness and was referred to OT by her PCP to help with decrease in handwriting skills over the last few years.  PRECAUTIONS: None   WEIGHT BEARING RESTRICTIONS: No  PAIN:  Are you having pain? No  FALLS: Has patient fallen in last 6 months? Yes. Number of falls 1-2  LIVING ENVIRONMENT: Lives with: lives alone Lives in: Independent living facility that has apartments called "[facility]" Stairs: Engineer, structural Has following equipment at home: Dan Humphreys - 2 wheeled, Environmental consultant - 4 wheeled, Wheelchair (power), shower chair, and Grab bars  PLOF: Independent  PATIENT GOALS: Desires to improve handwriting so that she is able to write her own checks  OBJECTIVE:   HAND DOMINANCE:  Left  ADLs:  Eating: Independent with feeding self Grooming: Independent UB Dressing: Able to put blouse on, does not wear shirt with buttons LB Dressing: Able to dress self with elastic pants, able to put socks and shoes Toileting: Independent Bathing: Pt. Has someone who comes and gives her a shower Tub Shower transfers: Depends on Aid to help get in and out of the walk in shower Equipment: Shower seat with back, Grab bars, Walk in shower, and bed side commode  IADLs: Shopping: Grand daughter does the grocery shopping however Pt. Reports going with  her sometimes Light housekeeping: Pt. States she keeps a clean house and can vacuum from her wheelchair Meal Prep: Meals on wheels brings her food, grand daughter will also help prepare light meals Community mobility: Public transportation Medication management: Independent, uses pill Nature conservation officer: [place] helps pay pills and writes checks Handwriting: Print 25% legible Cursive: 25% legible Pt. Was asked to write name on paper and instead of writing side to side Pt. wrote vertically  MOBILITY STATUS: Needs Assist: Wheelchair  POSTURE COMMENTS:  No Significant postural limitations Sitting balance: WFL  ACTIVITY TOLERANCE: Activity tolerance: WFL  FUNCTIONAL OUTCOME MEASURES: FOTO: 69 TR: 68  UPPER EXTREMITY ROM:    Active ROM Right WFL Left WFL  Shoulder flexion    Shoulder abduction    Shoulder adduction    Shoulder extension    Shoulder internal rotation    Shoulder external rotation    Elbow flexion    Elbow extension    Wrist flexion    Wrist extension    Wrist ulnar deviation    Wrist radial deviation    Wrist pronation    Wrist supination    (Blank rows = not tested)  UPPER EXTREMITY MMT:     MMT Right eval Left eval  Shoulder flexion 4+/5 4/5  Shoulder abduction 4+/5 4/5  Shoulder adduction    Shoulder extension    Shoulder internal rotation    Shoulder external rotation    Middle trapezius    Lower trapezius    Elbow flexion 5/5 4+/5  Elbow extension 5/5 4+/5  Wrist flexion 4+/5 4/5  Wrist extension 4+/5 4/5  Wrist ulnar deviation    Wrist radial deviation    Wrist pronation    Wrist supination    (Blank rows = not tested)  HAND FUNCTION: Grip strength: Right: 40 lbs; Left: 44 lbs, Lateral pinch: Right: 7 lbs, Left: 8 lbs, and 3 point pinch: Right: 6 lbs, Left: 7 lbs  COORDINATION: 9 Hole Peg test: Right: 1 minute 10 sec; Left: 58 sec  SENSATION: Not tested  EDEMA: None  MUSCLE TONE: WFL  COGNITION: Overall  cognitive status: Within functional limits for tasks assessed  VISION: Subjective report: Pt. Reports that she wears glasses for most of the time however takes them off sometimes. Baseline vision: Wear glasses most of the time Visual history: cataracts  PERCEPTION: WFL  PRAXIS: WFL   TODAY'S TREATMENT:  DATE: 12/03/2022  Therapeutic Activity: Facilitated L hand FMC/dexterity skills working to pick up and place small pegs from a non-skid surface and place into pegboard.  Practiced storing multiple pegs in hand, translatory skills to move 1 peg from palm to fingertips in prep to place peg into pegboard without dropping the stored pegs in palm.  Pt was given positioning cues to rest L forearm on table top for more distal control in the hand.  Self Care: Facilitated handwriting practice to increase legibility for writing out checks.  Pt trialed standard pen, wide grip pen, and weighted pen d/t pt's unsteady hand, as well as trial with a rubber pencil grip.  Pt found benefit only from the rubber pencil grip, so this was issued for a home pen/pencil.  Pt practiced writing sample checks with OT providing cues for positioning L forearm on table top for more distal control of the L hand, as well as positioning self closer to table while sitting in wc to further maximize UE support on table top.  Pt practiced writing with a folder beneath lined paper and was given cues to increase force with pen to paper for increased proprioceptive input.  Legibility of checks was <50%, but improved slightly when increasing force as noted above.   PATIENT EDUCATION: Education details: Adult nurse Person educated: Patient Education method: Explanation, Demonstration, and Verbal cues Education comprehension: verbalized understanding, demonstrated understanding; further training  needed  HOME EXERCISE PROGRAM: Handwriting practice   GOALS: Goals reviewed with patient? Yes  SHORT TERM GOALS: Target date: 01/08/2023  Pt. Will improve FOTO score by 2 points to reflect improved perceived function performance in specific ADL/IADL. Baseline: FOTO: 69 TR: 68 Goal status: INITIAL  LONG TERM GOALS: Target date: 01/19/2023  Pt. Will improve R and L hand FMC skills by 10 seconds of speed to independently manipulate small objects during ADL/IADL tasks. Baseline: Eval: 9 Hole Peg test: Right: 1 minute 10 sec; Left: 58 sec Goal status: INITIAL  2.  Pt. Will improve L shoulder strength by 1 mm grade to be able to sustain LUE elevation while performing ADL/IADL tasks. Baseline: Eval: L: Shoulder flexion: 4/5, Shoulder abduction: 4/5 Goal status: INITIAL  3.  Pt. Will improve L hand FMC/dexterity skills to be able to sign her name on checks with 75% legibility, using writing aids as needed. Baseline: Eval: Print: 25% legibility, Cursive: 25% legibility Goal status: INITIAL  ASSESSMENT:  CLINICAL IMPRESSION: Pt tolerated FMC activities well this date.  Pt felt placing the small pegs into pegboard was more challenging than the "metal" pegs from a previous session.  Pt frequently dropped pegs when trying to manipulate more than 1 peg at a time, but did well with a single peg when not having to simultaneously store others in palm.  <50% legibility noted when filling out practice checks, but pt did find benefit from use of a rubber pencil grip and cues for increasing force with pen to paper.  Pt benefited from positioning cues to maximize UE support while writing.  Rubber pencil grip was issued for additional writing practice at home.  Pt. continues to benefit from skilled OT services to increase [facility] skills in the R and L hand and improve LUE strength to increase independence and enhance engagement in ADL/IADL tasks.   PERFORMANCE DEFICITS: in functional skills including ADLs,  IADLs, coordination, strength, and Fine motor control, cognitive skills including problem solving, and psychosocial skills including coping strategies, environmental adaptation, and routines and behaviors.   IMPAIRMENTS:  are limiting patient from ADLs and IADLs.   CO-MORBIDITIES: may have co-morbidities  that affects occupational performance. Patient will benefit from skilled OT to address above impairments and improve overall function.  MODIFICATION OR ASSISTANCE TO COMPLETE EVALUATION: Min-Moderate modification of tasks or assist with assess necessary to complete an evaluation.  OT OCCUPATIONAL PROFILE AND HISTORY: Detailed assessment: Review of records and additional review of physical, cognitive, psychosocial history related to current functional performance.  CLINICAL DECISION MAKING: Moderate - several treatment options, min-mod task modification necessary  REHAB POTENTIAL: Good  EVALUATION COMPLEXITY: Moderate    PLAN:  OT FREQUENCY: 2x/week  OT DURATION: 12 weeks  PLANNED INTERVENTIONS: self care/ADL training, therapeutic exercise, therapeutic activity, neuromuscular re-education, and patient/family education  RECOMMENDED OTHER SERVICES: N/A  CONSULTED AND AGREED WITH PLAN OF CARE: Patient  PLAN FOR NEXT SESSION: Initiate treatment  Danelle Earthly, MS, OTR/L 12/03/2022, 7:43 PM

## 2022-12-08 ENCOUNTER — Ambulatory Visit: Payer: Medicare Other | Admitting: Occupational Therapy

## 2022-12-08 DIAGNOSIS — M6281 Muscle weakness (generalized): Secondary | ICD-10-CM | POA: Diagnosis not present

## 2022-12-08 NOTE — Therapy (Addendum)
OUTPATIENT OCCUPATIONAL THERAPY NEURO TREATMENT NOTE  Patient Name: Susan [name] MRN: [id number] DOB:May 11, 1926, 89 y.o., female Today's Date: 12/08/2022  PCP: [name] REFERRING PROVIDER: [name]  END OF SESSION:  OT End of Session - 12/08/22 1328     Visit Number 8    Number of Visits 24    Date for OT Re-Evaluation 01/19/23    OT Start Time 1315    OT Stop Time 1400    OT Time Calculation (min) 45 min    Activity Tolerance Patient tolerated treatment well    Behavior During Therapy [facility] for tasks assessed/performed            Past Medical History:  Diagnosis Date   Anemia    Balance problem    Diabetes mellitus without complication (HCC)    Diverticulosis    Heart murmur    Hypercholesteremia    Hypertension    Stroke [facility])    TIA 2006   TIA (transient ischemic attack) 2006   Past Surgical History:  Procedure Laterality Date   CATARACT EXTRACTION W/PHACO Left 05/22/2015   Procedure: CATARACT EXTRACTION PHACO AND INTRAOCULAR LENS PLACEMENT (IOC);  Surgeon: Galen Manila, MD;  Location: [facility] ORS;  Service: Ophthalmology;  Laterality: Left;  [place]: 01:07.9 AP%: 23.0 CDE: 15.62 Lot# [id number]   CATARACT EXTRACTION W/PHACO Right 06/12/2015   Procedure: CATARACT EXTRACTION PHACO AND INTRAOCULAR LENS PLACEMENT (IOC);  Surgeon: Galen Manila, MD;  Location: [facility] ORS;  Service: Ophthalmology;  Laterality: Right;  [place] 00:57 AP% 17.1 CDE 9.86 fluid pack lot # [id number]   COLON SURGERY     resection   COLONOSCOPY WITH PROPOFOL N/A 10/29/2021   Procedure: COLONOSCOPY WITH PROPOFOL;  Surgeon: Regis Bill, MD;  Location: [facility] ENDOSCOPY;  Service: Endoscopy;  Laterality: N/A;   ECTOPIC PREGNANCY SURGERY     HERNIA REPAIR     TONSILLECTOMY     Patient Active Problem List   Diagnosis Date Noted   Renal lesion, right 10/30/2021   Acute blood loss anemia 10/30/2021   Lower GI bleed 10/27/2021   GI bleeding 05/29/2021   CVA (cerebral vascular accident) (HCC)  07/04/2017    ONSET DATE: 04/2018  REFERRING DIAG: CVA  THERAPY DIAG:  Muscle weakness (generalized)  Rationale for Evaluation and Treatment: Rehabilitation  SUBJECTIVE:   SUBJECTIVE STATEMENT: Pt. reports that she is doing well today. Pt accompanied by: self  PERTINENT HISTORY: Pt. Is a 89 y.o. female who has a past medical history of CVA, hypertension, type ll DM, chronic anemia, history of diverticular bleed in February 2023. Pt. Has had a recent fall on 02/22/2022 and was found laying on the floor and had been laying there for a couple of days. Pt. Presents with generalized weakness and was referred to OT by her PCP to help with decrease in handwriting skills over the last few years.  PRECAUTIONS: None   WEIGHT BEARING RESTRICTIONS: No  PAIN:  Are you having pain? No  FALLS: Has patient fallen in last 6 months? Yes. Number of falls 1-2  LIVING ENVIRONMENT: Lives with: lives alone Lives in: Independent living facility that has apartments called "[facility]" Stairs: Engineer, structural Has following equipment at home: Dan Humphreys - 2 wheeled, Environmental consultant - 4 wheeled, Wheelchair (power), shower chair, and Grab bars  PLOF: Independent  PATIENT GOALS: Desires to improve handwriting so that she is able to write her own checks  OBJECTIVE:   HAND DOMINANCE: Left  ADLs:  Eating: Independent with feeding self Grooming: Independent UB Dressing:  Able to put blouse on, does not wear shirt with buttons LB Dressing: Able to dress self with elastic pants, able to put socks and shoes Toileting: Independent Bathing: Pt. Has someone who comes and gives her a shower Tub Shower transfers: Depends on Aid to help get in and out of the walk in shower Equipment: Shower seat with back, Grab bars, Walk in shower, and bed side commode  IADLs: Shopping: Grand daughter does the grocery shopping however Pt. Reports going with her sometimes Light housekeeping: Pt. States she keeps a clean house and can  vacuum from her wheelchair Meal Prep: Meals on wheels brings her food, grand daughter will also help prepare light meals Community mobility: Public transportation Medication management: Independent, uses pill Nature conservation officer: [place] helps pay pills and writes checks Handwriting: Print 25% legible Cursive: 25% legible Pt. Was asked to write name on paper and instead of writing side to side Pt. wrote vertically  MOBILITY STATUS: Needs Assist: Wheelchair  POSTURE COMMENTS:  No Significant postural limitations Sitting balance: WFL  ACTIVITY TOLERANCE: Activity tolerance: WFL  FUNCTIONAL OUTCOME MEASURES: FOTO: 69 TR: 68  UPPER EXTREMITY ROM:    Active ROM Right WFL Left WFL  Shoulder flexion    Shoulder abduction    Shoulder adduction    Shoulder extension    Shoulder internal rotation    Shoulder external rotation    Elbow flexion    Elbow extension    Wrist flexion    Wrist extension    Wrist ulnar deviation    Wrist radial deviation    Wrist pronation    Wrist supination    (Blank rows = not tested)  UPPER EXTREMITY MMT:     MMT Right eval Left eval  Shoulder flexion 4+/5 4/5  Shoulder abduction 4+/5 4/5  Shoulder adduction    Shoulder extension    Shoulder internal rotation    Shoulder external rotation    Middle trapezius    Lower trapezius    Elbow flexion 5/5 4+/5  Elbow extension 5/5 4+/5  Wrist flexion 4+/5 4/5  Wrist extension 4+/5 4/5  Wrist ulnar deviation    Wrist radial deviation    Wrist pronation    Wrist supination    (Blank rows = not tested)  HAND FUNCTION: Grip strength: Right: 40 lbs; Left: 44 lbs, Lateral pinch: Right: 7 lbs, Left: 8 lbs, and 3 point pinch: Right: 6 lbs, Left: 7 lbs  COORDINATION: 9 Hole Peg test: Right: 1 minute 10 sec; Left: 58 sec  SENSATION: Not tested  EDEMA: None  MUSCLE TONE: WFL  COGNITION: Overall cognitive status: Within functional limits for tasks  assessed  VISION: Subjective report: Pt. Reports that she wears glasses for most of the time however takes them off sometimes. Baseline vision: Wear glasses most of the time Visual history: cataracts  PERCEPTION: WFL  PRAXIS: WFL   TODAY'S TREATMENT:  DATE: 12/08/2022     Self Care:  Pt. worked on Mining engineer with emphasis placed on formulating letters, and words on lined paper while avoiding deviation from the line. Pt. worked on letters, and words.  Pt. Utilized a pencil with an adaptive rubber tip. Pt. required fewer cues today for positioning L forearm on table top for more distal control of the L hand, as well as positioning self closer to table while sitting in wc to further maximize UE support on table top.    PATIENT EDUCATION: Education details: Adult nurse Person educated: Patient Education method: Explanation, Demonstration, and Verbal cues Education comprehension: verbalized understanding, demonstrated understanding; further training needed  HOME EXERCISE PROGRAM: Handwriting practice   GOALS: Goals reviewed with patient? Yes  SHORT TERM GOALS: Target date: 01/08/2023  Pt. Will improve FOTO score by 2 points to reflect improved perceived function performance in specific ADL/IADL. Baseline: FOTO: 69 TR: 68 Goal status: INITIAL  LONG TERM GOALS: Target date: 01/19/2023  Pt. Will improve R and L hand FMC skills by 10 seconds of speed to independently manipulate small objects during ADL/IADL tasks. Baseline: Eval: 9 Hole Peg test: Right: 1 minute 10 sec; Left: 58 sec Goal status: INITIAL  2.  Pt. Will improve L shoulder strength by 1 mm grade to be able to sustain LUE elevation while performing ADL/IADL tasks. Baseline: Eval: L: Shoulder flexion: 4/5, Shoulder abduction: 4/5 Goal status: INITIAL  3.  Pt. Will improve L hand  FMC/dexterity skills to be able to sign her name on checks with 75% legibility, using writing aids as needed. Baseline: Eval: Print: 25% legibility, Cursive: 25% legibility Goal status: INITIAL  ASSESSMENT:  CLINICAL IMPRESSION:  Pt. worked on writing with sustained attention, and emphasis placed on writing skills with letter formation without deviating above, or below the line. Pt. presents with positive deviation above, and below the line when formulating letters. Pt. Continues to benefit from cues for positioning to maximize UE support while writing. Pt. requires cues for positioning of the rubber pencil grip was issued for additional writing practice at home. Pt. continues to benefit from skilled OT services to increase [facility] skills in the R and L hand and improve LUE strength to increase independence and enhance engagement in ADL/IADL tasks.   PERFORMANCE DEFICITS: in functional skills including ADLs, IADLs, coordination, strength, and Fine motor control, cognitive skills including problem solving, and psychosocial skills including coping strategies, environmental adaptation, and routines and behaviors.   IMPAIRMENTS: are limiting patient from ADLs and IADLs.   CO-MORBIDITIES: may have co-morbidities  that affects occupational performance. Patient will benefit from skilled OT to address above impairments and improve overall function.  MODIFICATION OR ASSISTANCE TO COMPLETE EVALUATION: Min-Moderate modification of tasks or assist with assess necessary to complete an evaluation.  OT OCCUPATIONAL PROFILE AND HISTORY: Detailed assessment: Review of records and additional review of physical, cognitive, psychosocial history related to current functional performance.  CLINICAL DECISION MAKING: Moderate - several treatment options, min-mod task modification necessary  REHAB POTENTIAL: Good  EVALUATION COMPLEXITY: Moderate    PLAN:  OT FREQUENCY: 2x/week  OT DURATION: 12 weeks  PLANNED  INTERVENTIONS: self care/ADL training, therapeutic exercise, therapeutic activity, neuromuscular re-education, and patient/family education  RECOMMENDED OTHER SERVICES: N/A  CONSULTED AND AGREED WITH PLAN OF CARE: Patient  PLAN FOR NEXT SESSION: Initiate treatment  Olegario Messier, MS, OTR/L 12/08/2022, 1:32 PM

## 2022-12-10 ENCOUNTER — Ambulatory Visit: Payer: Medicare Other | Admitting: Occupational Therapy

## 2022-12-10 DIAGNOSIS — M6281 Muscle weakness (generalized): Secondary | ICD-10-CM | POA: Diagnosis not present

## 2022-12-10 DIAGNOSIS — R278 Other lack of coordination: Secondary | ICD-10-CM

## 2022-12-10 NOTE — Therapy (Signed)
OUTPATIENT OCCUPATIONAL THERAPY NEURO TREATMENT NOTE  Patient Name: Susan [name] MRN: [id number] DOB:[birth year], 89 y.o., female Today's Date: 12/10/2022  PCP: [name] REFERRING PROVIDER: [name]  END OF SESSION:  OT End of Session - 12/10/22 1329     Visit Number 9    Number of Visits 24    Date for OT Re-Evaluation 01/19/23    OT Start Time 1315    OT Stop Time 1400    OT Time Calculation (min) 45 min    Activity Tolerance Patient tolerated treatment well    Behavior During Therapy [facility] for tasks assessed/performed            Past Medical History:  Diagnosis Date   Anemia    Balance problem    Diabetes mellitus without complication (HCC)    Diverticulosis    Heart murmur    Hypercholesteremia    Hypertension    Stroke [facility])    TIA 2006   TIA (transient ischemic attack) 2006   Past Surgical History:  Procedure Laterality Date   CATARACT EXTRACTION W/PHACO Left 05/22/2015   Procedure: CATARACT EXTRACTION PHACO AND INTRAOCULAR LENS PLACEMENT (IOC);  Surgeon: Galen Manila, MD;  Location: [facility] ORS;  Service: Ophthalmology;  Laterality: Left;  [place]: 01:07.9 AP%: 23.0 CDE: 15.62 Lot# [id number]   CATARACT EXTRACTION W/PHACO Right 06/12/2015   Procedure: CATARACT EXTRACTION PHACO AND INTRAOCULAR LENS PLACEMENT (IOC);  Surgeon: Galen Manila, MD;  Location: [facility] ORS;  Service: Ophthalmology;  Laterality: Right;  [place] 00:57 AP% 17.1 CDE 9.86 fluid pack lot # [id number]   COLON SURGERY     resection   COLONOSCOPY WITH PROPOFOL N/A 10/29/2021   Procedure: COLONOSCOPY WITH PROPOFOL;  Surgeon: Regis Bill, MD;  Location: [facility] ENDOSCOPY;  Service: Endoscopy;  Laterality: N/A;   ECTOPIC PREGNANCY SURGERY     HERNIA REPAIR     TONSILLECTOMY     Patient Active Problem List   Diagnosis Date Noted   Renal lesion, right 10/30/2021   Acute blood loss anemia 10/30/2021   Lower GI bleed 10/27/2021   GI bleeding 05/29/2021   CVA (cerebral vascular accident) (HCC)  07/04/2017    ONSET DATE: 04/2018  REFERRING DIAG: CVA  THERAPY DIAG:  Muscle weakness (generalized)  Other lack of coordination  Rationale for Evaluation and Treatment: Rehabilitation  SUBJECTIVE:   SUBJECTIVE STATEMENT: Pt. reports that she is doing well today. Pt. Was early for the session.  Pt accompanied by: self  PERTINENT HISTORY: Pt. Is a 89 y.o. female who has a past medical history of CVA, hypertension, type ll DM, chronic anemia, history of diverticular bleed in February 2023. Pt. Has had a recent fall on 02/22/2022 and was found laying on the floor and had been laying there for a couple of days. Pt. Presents with generalized weakness and was referred to OT by her PCP to help with decrease in handwriting skills over the last few years.  PRECAUTIONS: None   WEIGHT BEARING RESTRICTIONS: No  PAIN:  Are you having pain? No  FALLS: Has patient fallen in last 6 months? Yes. Number of falls 1-2  LIVING ENVIRONMENT: Lives with: lives alone Lives in: Independent living facility that has apartments called "[facility]" Stairs: Engineer, structural Has following equipment at home: Dan Humphreys - 2 wheeled, Environmental consultant - 4 wheeled, Wheelchair (power), shower chair, and Grab bars  PLOF: Independent  PATIENT GOALS: Desires to improve handwriting so that she is able to write her own checks  OBJECTIVE:   HAND DOMINANCE: Left  ADLs:  Eating: Independent with feeding self Grooming: Independent UB Dressing: Able to put blouse on, does not wear shirt with buttons LB Dressing: Able to dress self with elastic pants, able to put socks and shoes Toileting: Independent Bathing: Pt. Has someone who comes and gives her a shower Tub Shower transfers: Depends on Aid to help get in and out of the walk in shower Equipment: Shower seat with back, Grab bars, Walk in shower, and bed side commode  IADLs: Shopping: Grand daughter does the grocery shopping however Pt. Reports going with her sometimes Light  housekeeping: Pt. States she keeps a clean house and can vacuum from her wheelchair Meal Prep: Meals on wheels brings her food, grand daughter will also help prepare light meals Community mobility: Public transportation Medication management: Independent, uses pill Nature conservation officer: [place] helps pay pills and writes checks Handwriting: Print 25% legible Cursive: 25% legible Pt. Was asked to write name on paper and instead of writing side to side Pt. wrote vertically  MOBILITY STATUS: Needs Assist: Wheelchair  POSTURE COMMENTS:  No Significant postural limitations Sitting balance: WFL  ACTIVITY TOLERANCE: Activity tolerance: WFL  FUNCTIONAL OUTCOME MEASURES: FOTO: 69 TR: 68  UPPER EXTREMITY ROM:    Active ROM Right WFL Left WFL  Shoulder flexion    Shoulder abduction    Shoulder adduction    Shoulder extension    Shoulder internal rotation    Shoulder external rotation    Elbow flexion    Elbow extension    Wrist flexion    Wrist extension    Wrist ulnar deviation    Wrist radial deviation    Wrist pronation    Wrist supination    (Blank rows = not tested)  UPPER EXTREMITY MMT:     MMT Right eval Left eval  Shoulder flexion 4+/5 4/5  Shoulder abduction 4+/5 4/5  Shoulder adduction    Shoulder extension    Shoulder internal rotation    Shoulder external rotation    Middle trapezius    Lower trapezius    Elbow flexion 5/5 4+/5  Elbow extension 5/5 4+/5  Wrist flexion 4+/5 4/5  Wrist extension 4+/5 4/5  Wrist ulnar deviation    Wrist radial deviation    Wrist pronation    Wrist supination    (Blank rows = not tested)  HAND FUNCTION: Grip strength: Right: 40 lbs; Left: 44 lbs, Lateral pinch: Right: 7 lbs, Left: 8 lbs, and 3 point pinch: Right: 6 lbs, Left: 7 lbs  COORDINATION: 9 Hole Peg test: Right: 1 minute 10 sec; Left: 58 sec  SENSATION: Not tested  EDEMA: None  MUSCLE TONE: WFL  COGNITION: Overall cognitive status:  Within functional limits for tasks assessed  VISION: Subjective report: Pt. Reports that she wears glasses for most of the time however takes them off sometimes. Baseline vision: Wear glasses most of the time Visual history: cataracts  PERCEPTION: WFL  PRAXIS: WFL   TODAY'S TREATMENT:  DATE: 12/10/2022   Self Care:  Pt. worked on Data processing manager for creating grocery lists on a 2.5" width piece of paper. Pt. worked on both lined, and unlined paper. Pt. worked on reps on prewriting exercises tracing full, and 1/2 line peaks on lined paper.   PATIENT EDUCATION: Education details: Adult nurse Person educated: Patient Education method: Explanation, Demonstration, and Verbal cues Education comprehension: verbalized understanding, demonstrated understanding; further training needed  HOME EXERCISE PROGRAM: Handwriting practice   GOALS: Goals reviewed with patient? Yes  SHORT TERM GOALS: Target date: 01/08/2023  Pt. Will improve FOTO score by 2 points to reflect improved perceived function performance in specific ADL/IADL. Baseline: FOTO: 69 TR: 68 Goal status: INITIAL  LONG TERM GOALS: Target date: 01/19/2023  Pt. Will improve R and L hand FMC skills by 10 seconds of speed to independently manipulate small objects during ADL/IADL tasks. Baseline: Eval: 9 Hole Peg test: Right: 1 minute 10 sec; Left: 58 sec Goal status: INITIAL  2.  Pt. Will improve L shoulder strength by 1 mm grade to be able to sustain LUE elevation while performing ADL/IADL tasks. Baseline: Eval: L: Shoulder flexion: 4/5, Shoulder abduction: 4/5 Goal status: INITIAL  3.  Pt. Will improve L hand FMC/dexterity skills to be able to sign her name on checks with 75% legibility, using writing aids as needed. Baseline: Eval: Print: 25% legibility, Cursive: 25%  legibility Goal status: INITIAL  ASSESSMENT:  CLINICAL IMPRESSION:  Pt. brought the HEP sheet into the clinic, and presented it with improved reps with very minimal deviation above, and below the line. Pt. was able to formulate the word items on the shopping list with 50-75% legibility. Plan to perform 10th visit progress report next visit. Pt. required cues to LUE strength to increase independence and enhance engagement in ADL/IADL tasks.   PERFORMANCE DEFICITS: in functional skills including ADLs, IADLs, coordination, strength, and Fine motor control, cognitive skills including problem solving, and psychosocial skills including coping strategies, environmental adaptation, and routines and behaviors.   IMPAIRMENTS: are limiting patient from ADLs and IADLs.   CO-MORBIDITIES: may have co-morbidities  that affects occupational performance. Patient will benefit from skilled OT to address above impairments and improve overall function.  MODIFICATION OR ASSISTANCE TO COMPLETE EVALUATION: Min-Moderate modification of tasks or assist with assess necessary to complete an evaluation.  OT OCCUPATIONAL PROFILE AND HISTORY: Detailed assessment: Review of records and additional review of physical, cognitive, psychosocial history related to current functional performance.  CLINICAL DECISION MAKING: Moderate - several treatment options, min-mod task modification necessary  REHAB POTENTIAL: Good  EVALUATION COMPLEXITY: Moderate    PLAN:  OT FREQUENCY: 2x/week  OT DURATION: 12 weeks  PLANNED INTERVENTIONS: self care/ADL training, therapeutic exercise, therapeutic activity, neuromuscular re-education, and patient/family education  RECOMMENDED OTHER SERVICES: N/A  CONSULTED AND AGREED WITH PLAN OF CARE: Patient  PLAN FOR NEXT SESSION: Initiate treatment  Olegario Messier, MS, OTR/L 12/10/2022, 1:53 PM

## 2022-12-17 ENCOUNTER — Ambulatory Visit: Payer: Medicare Other | Attending: Family Medicine | Admitting: Occupational Therapy

## 2022-12-17 ENCOUNTER — Telehealth: Payer: Self-pay | Admitting: Occupational Therapy

## 2022-12-17 DIAGNOSIS — R278 Other lack of coordination: Secondary | ICD-10-CM | POA: Insufficient documentation

## 2022-12-17 DIAGNOSIS — M6281 Muscle weakness (generalized): Secondary | ICD-10-CM | POA: Insufficient documentation

## 2022-12-17 NOTE — Telephone Encounter (Signed)
Pt. did not arrive for her therapy appointment this afternoon. Several attempts were made to reach out to the Pt., however a busy signal was received each time.

## 2022-12-22 ENCOUNTER — Ambulatory Visit: Payer: Medicare Other | Admitting: Occupational Therapy

## 2022-12-22 DIAGNOSIS — R278 Other lack of coordination: Secondary | ICD-10-CM | POA: Diagnosis present

## 2022-12-22 DIAGNOSIS — M6281 Muscle weakness (generalized): Secondary | ICD-10-CM

## 2022-12-22 NOTE — Therapy (Signed)
Occupational Therapy Progress Note  Dates of reporting period  10/27/2022   to   12/22/2022   Patient Name: Susan [name] MRN: [id number] DOB:1926/08/06, 89 y.o., female Today's Date: 12/22/2022  PCP: [name] REFERRING PROVIDER: [name]  END OF SESSION:  OT End of Session - 12/22/22 1407     Visit Number 10    Number of Visits 24    Date for OT Re-Evaluation 01/19/23    OT Start Time 1400    OT Stop Time 1445    OT Time Calculation (min) 45 min    Activity Tolerance Patient tolerated treatment well    Behavior During Therapy [facility] for tasks assessed/performed            Past Medical History:  Diagnosis Date   Anemia    Balance problem    Diabetes mellitus without complication (HCC)    Diverticulosis    Heart murmur    Hypercholesteremia    Hypertension    Stroke [facility])    TIA 2006   TIA (transient ischemic attack) 2006   Past Surgical History:  Procedure Laterality Date   CATARACT EXTRACTION W/PHACO Left 05/22/2015   Procedure: CATARACT EXTRACTION PHACO AND INTRAOCULAR LENS PLACEMENT (IOC);  Surgeon: Galen Manila, MD;  Location: [facility] ORS;  Service: Ophthalmology;  Laterality: Left;  [place]: 01:07.9 AP%: 23.0 CDE: 15.62 Lot# [id number]   CATARACT EXTRACTION W/PHACO Right 06/12/2015   Procedure: CATARACT EXTRACTION PHACO AND INTRAOCULAR LENS PLACEMENT (IOC);  Surgeon: Galen Manila, MD;  Location: [facility] ORS;  Service: Ophthalmology;  Laterality: Right;  [place] 00:57 AP% 17.1 CDE 9.86 fluid pack lot # [id number]   COLON SURGERY     resection   COLONOSCOPY WITH PROPOFOL N/A 10/29/2021   Procedure: COLONOSCOPY WITH PROPOFOL;  Surgeon: Regis Bill, MD;  Location: [facility] ENDOSCOPY;  Service: Endoscopy;  Laterality: N/A;   ECTOPIC PREGNANCY SURGERY     HERNIA REPAIR     TONSILLECTOMY     Patient Active Problem List   Diagnosis Date Noted   Renal lesion, right 10/30/2021   Acute blood loss anemia 10/30/2021   Lower GI bleed 10/27/2021   GI bleeding 05/29/2021    CVA (cerebral vascular accident) (HCC) 07/04/2017    ONSET DATE: 04/2018  REFERRING DIAG: CVA  THERAPY DIAG:  Muscle weakness (generalized)  Rationale for Evaluation and Treatment: Rehabilitation  SUBJECTIVE:   SUBJECTIVE STATEMENT: Pt. reports that she is doing well today. Pt.  Reports that transportation never came to pick her up last week.   Pt accompanied by: self  PERTINENT HISTORY: Pt. Is a 89 y.o. female who has a past medical history of CVA, hypertension, type ll DM, chronic anemia, history of diverticular bleed in February 2023. Pt. Has had a recent fall on 02/22/2022 and was found laying on the floor and had been laying there for a couple of days. Pt. Presents with generalized weakness and was referred to OT by her PCP to help with decrease in handwriting skills over the last few years.  PRECAUTIONS: None   WEIGHT BEARING RESTRICTIONS: No  PAIN:  Are you having pain? No  FALLS: Has patient fallen in last 6 months? Yes. Number of falls 1-2  LIVING ENVIRONMENT: Lives with: lives alone Lives in: Independent living facility that has apartments called "[facility]" Stairs: Engineer, structural Has following equipment at home: Dan Humphreys - 2 wheeled, Environmental consultant - 4 wheeled, Wheelchair (power), shower chair, and Grab bars  PLOF: Independent  PATIENT GOALS: Desires to improve handwriting so that  she is able to write her own checks  OBJECTIVE:   HAND DOMINANCE: Left  ADLs:  Eating: Independent with feeding self Grooming: Independent UB Dressing: Able to put blouse on, does not wear shirt with buttons LB Dressing: Able to dress self with elastic pants, able to put socks and shoes Toileting: Independent Bathing: Pt. Has someone who comes and gives her a shower Tub Shower transfers: Depends on Aid to help get in and out of the walk in shower Equipment: Shower seat with back, Grab bars, Walk in shower, and bed side commode  IADLs: Shopping: Grand daughter does the grocery shopping  however Pt. Reports going with her sometimes Light housekeeping: Pt. States she keeps a clean house and can vacuum from her wheelchair Meal Prep: Meals on wheels brings her food, grand daughter will also help prepare light meals Community mobility: Public transportation Medication management: Independent, uses pill Nature conservation officer: [place] helps pay pills and writes checks Handwriting: Print 25% legible Cursive: 25% legible Pt. Was asked to write name on paper and instead of writing side to side Pt. wrote vertically  MOBILITY STATUS: Needs Assist: Wheelchair  POSTURE COMMENTS:  No Significant postural limitations Sitting balance: WFL  ACTIVITY TOLERANCE: Activity tolerance: WFL  FUNCTIONAL OUTCOME MEASURES: FOTO: 69 TR: 68  UPPER EXTREMITY ROM:    Active ROM Right WFL Left WFL  Shoulder flexion    Shoulder abduction    Shoulder adduction    Shoulder extension    Shoulder internal rotation    Shoulder external rotation    Elbow flexion    Elbow extension    Wrist flexion    Wrist extension    Wrist ulnar deviation    Wrist radial deviation    Wrist pronation    Wrist supination    (Blank rows = not tested)  UPPER EXTREMITY MMT:     MMT Right eval Right 12/22/2022 Left eval Left 12/22/2022  Shoulder flexion 4+/5 4+/5 4/5 4+/5  Shoulder abduction 4+/5 4+/5 4/5 4+/5  Shoulder adduction      Shoulder extension      Shoulder internal rotation      Shoulder external rotation      Middle trapezius      Lower trapezius      Elbow flexion 5/5 5/5 4+/5 4+/5  Elbow extension 5/5 5/5 4+/5 4+/5  Wrist flexion 4+/5 4+/5 4/5 4+/5  Wrist extension 4+/5 4+/5 4/5 4+/5  Wrist ulnar deviation      Wrist radial deviation      Wrist pronation      Wrist supination      (Blank rows = not tested)  HAND FUNCTION: Grip strength: Right: 40 lbs; Left: 44 lbs, Lateral pinch: Right: 7 lbs, Left: 8 lbs, and 3 point pinch: Right: 6 lbs, Left: 7 lbs 12/22/2022:  Right: 40 lbs; Left: 40 lbs, Lateral pinch: Right: 11 lbs, Left: 8 lbs, and 3 point pinch: Right: 6 lbs, Left: 5 lbs  COORDINATION: 9 Hole Peg test: Right: 1 minute 10 sec; Left: 58 sec 12/22/2022: 9 Hole Peg test: Right: 1 minute & 1 sec; Left: 53 sec  SENSATION: Not tested  EDEMA: None  MUSCLE TONE: WFL  COGNITION: Overall cognitive status: Within functional limits for tasks assessed  VISION: Subjective report: Pt. Reports that she wears glasses for most of the time however takes them off sometimes. Baseline vision: Wear glasses most of the time Visual history: cataracts  PERCEPTION: WFL  PRAXIS: WFL   TODAY'S TREATMENT:  DATE: 12/22/2022   Measurements were obtained, and goals were reviewed with the Pt.  PATIENT EDUCATION: Education details: handwriting strategies, measurements, goals. Person educated: Patient Education method: Explanation, Demonstration, and Verbal cues Education comprehension: verbalized understanding, demonstrated understanding; further training needed  HOME EXERCISE PROGRAM: Handwriting practice   GOALS: Goals reviewed with patient? Yes  SHORT TERM GOALS: Target date: 01/08/2023  Pt. Will improve FOTO score by 2 points to reflect improved perceived function performance in specific ADL/IADL. Baseline: 12/22/2022: FOTO: 77 FOTO: 69 TR: 68 Goal status: MET  LONG TERM GOALS: Target date: 01/19/2023  Pt. Will improve R and L hand FMC skills by 10 seconds of speed to independently manipulate small objects during ADL/IADL tasks. Baseline: 12/22/2022:9 Hole Peg test: Right: 1 minute & 1 sec; Left: 53 sec  Eval: 9 Hole Peg test: Right: 1 minute 10 sec; Left: 58 sec Goal status: INITIAL  2.  Pt. Will improve L shoulder strength by 1 mm grade to be able to sustain LUE elevation while performing ADL/IADL tasks. Baseline:  12/22/2022: L: Shoulder flexion: 4/5, Shoulder abduction: 4/5 Eval: L: Shoulder flexion: 4/5, Shoulder abduction: 4/5 Goal status: INITIAL  3.  Pt. Will improve L hand FMC/dexterity skills to be able to sign her name on checks with 75% legibility, using writing aids as needed. Baseline: 12/22/2022: 12/22/2023: Print: 50% legibility, cursive 50% legibility Eval: Print: 25% legibility, Cursive: 25% legibility Goal status: INITIAL  ASSESSMENT:  CLINICAL IMPRESSION:  Pt. has made progress overall. Pt. Has made excellent progress with bilateral [facility] skills. Pt. Was able to complete writing a 10 word sentence in 2 min. &11 sec.  Pt. was able to formulate the word items on the shopping list with 50% legibility with a pen, and 50-75% legibility with a pencil.  Pt.'s FOTO score improved to 77. Pt. Continues to present with limited BUE strength, left grip strength, and pinch strength.  Pt. Continues to benefit from OT services to improve LUE strength, and [facility] skills in order to improve writing legibility to increase independence and enhance engagement in ADL/IADL tasks.   PERFORMANCE DEFICITS: in functional skills including ADLs, IADLs, coordination, strength, and Fine motor control, cognitive skills including problem solving, and psychosocial skills including coping strategies, environmental adaptation, and routines and behaviors.   IMPAIRMENTS: are limiting patient from ADLs and IADLs.   CO-MORBIDITIES: may have co-morbidities  that affects occupational performance. Patient will benefit from skilled OT to address above impairments and improve overall function.  MODIFICATION OR ASSISTANCE TO COMPLETE EVALUATION: Min-Moderate modification of tasks or assist with assess necessary to complete an evaluation.  OT OCCUPATIONAL PROFILE AND HISTORY: Detailed assessment: Review of records and additional review of physical, cognitive, psychosocial history related to current functional performance.  CLINICAL DECISION  MAKING: Moderate - several treatment options, min-mod task modification necessary  REHAB POTENTIAL: Good  EVALUATION COMPLEXITY: Moderate    PLAN:  OT FREQUENCY: 2x/week  OT DURATION: 12 weeks  PLANNED INTERVENTIONS: self care/ADL training, therapeutic exercise, therapeutic activity, neuromuscular re-education, and patient/family education  RECOMMENDED OTHER SERVICES: N/A  CONSULTED AND AGREED WITH PLAN OF CARE: Patient  PLAN FOR NEXT SESSION: Initiate treatment  Olegario Messier, MS, OTR/L 12/22/2022, 2:08 PM

## 2022-12-24 ENCOUNTER — Ambulatory Visit: Payer: Medicare Other | Admitting: Occupational Therapy

## 2022-12-24 DIAGNOSIS — M6281 Muscle weakness (generalized): Secondary | ICD-10-CM

## 2022-12-24 DIAGNOSIS — R278 Other lack of coordination: Secondary | ICD-10-CM | POA: Diagnosis not present

## 2022-12-24 NOTE — Therapy (Signed)
OCCUPATIONAL THERAPY NEURO TREATMENT NOTE  Patient Name: Susan [name] MRN: [id number] DOB:11-Oct-1926, 89 y.o., female Today's Date: 12/24/2022  PCP: [name] REFERRING PROVIDER: [name]  END OF SESSION:  OT End of Session - 12/24/22 1549     Visit Number 11    Date for OT Re-Evaluation 01/19/23    OT Start Time 1335    OT Stop Time 1420    OT Time Calculation (min) 45 min    Equipment Utilized During Treatment Wheelchair    Activity Tolerance Patient tolerated treatment well    Behavior During Therapy [facility] for tasks assessed/performed            Past Medical History:  Diagnosis Date   Anemia    Balance problem    Diabetes mellitus without complication (HCC)    Diverticulosis    Heart murmur    Hypercholesteremia    Hypertension    Stroke [facility])    TIA 2006   TIA (transient ischemic attack) 2006   Past Surgical History:  Procedure Laterality Date   CATARACT EXTRACTION W/PHACO Left 05/22/2015   Procedure: CATARACT EXTRACTION PHACO AND INTRAOCULAR LENS PLACEMENT (IOC);  Surgeon: Galen Manila, MD;  Location: [facility] ORS;  Service: Ophthalmology;  Laterality: Left;  [place]: 01:07.9 AP%: 23.0 CDE: 15.62 Lot# [id number]   CATARACT EXTRACTION W/PHACO Right 06/12/2015   Procedure: CATARACT EXTRACTION PHACO AND INTRAOCULAR LENS PLACEMENT (IOC);  Surgeon: Galen Manila, MD;  Location: [facility] ORS;  Service: Ophthalmology;  Laterality: Right;  [place] 00:57 AP% 17.1 CDE 9.86 fluid pack lot # [id number]   COLON SURGERY     resection   COLONOSCOPY WITH PROPOFOL N/A 10/29/2021   Procedure: COLONOSCOPY WITH PROPOFOL;  Surgeon: Regis Bill, MD;  Location: [facility] ENDOSCOPY;  Service: Endoscopy;  Laterality: N/A;   ECTOPIC PREGNANCY SURGERY     HERNIA REPAIR     TONSILLECTOMY     Patient Active Problem List   Diagnosis Date Noted   Renal lesion, right 10/30/2021   Acute blood loss anemia 10/30/2021   Lower GI bleed 10/27/2021   GI bleeding 05/29/2021   CVA (cerebral vascular  accident) (HCC) 07/04/2017    ONSET DATE: 04/2018  REFERRING DIAG: CVA  THERAPY DIAG:  Muscle weakness (generalized)  Rationale for Evaluation and Treatment: Rehabilitation  SUBJECTIVE:   SUBJECTIVE STATEMENT: Pt. reports that she is doing well today.  Pt accompanied by: self  PERTINENT HISTORY: Pt. Is a 89 y.o. female who has a past medical history of CVA, hypertension, type ll DM, chronic anemia, history of diverticular bleed in February 2023. Pt. Has had a recent fall on 02/22/2022 and was found laying on the floor and had been laying there for a couple of days. Pt. Presents with generalized weakness and was referred to OT by her PCP to help with decrease in handwriting skills over the last few years.  PRECAUTIONS: None   WEIGHT BEARING RESTRICTIONS: No  PAIN:  Are you having pain? No  FALLS: Has patient fallen in last 6 months? Yes. Number of falls 1-2  LIVING ENVIRONMENT: Lives with: lives alone Lives in: Independent living facility that has apartments called "[facility]" Stairs: Engineer, structural Has following equipment at home: Dan Humphreys - 2 wheeled, Environmental consultant - 4 wheeled, Wheelchair (power), shower chair, and Grab bars  PLOF: Independent  PATIENT GOALS: Desires to improve handwriting so that she is able to write her own checks  OBJECTIVE:   HAND DOMINANCE: Left  ADLs:  Eating: Independent with feeding self Grooming: Independent UB  Dressing: Able to put blouse on, does not wear shirt with buttons LB Dressing: Able to dress self with elastic pants, able to put socks and shoes Toileting: Independent Bathing: Pt. Has someone who comes and gives her a shower Tub Shower transfers: Depends on Aid to help get in and out of the walk in shower Equipment: Shower seat with back, Grab bars, Walk in shower, and bed side commode  IADLs: Shopping: Grand daughter does the grocery shopping however Pt. Reports going with her sometimes Light housekeeping: Pt. States she keeps a clean  house and can vacuum from her wheelchair Meal Prep: Meals on wheels brings her food, grand daughter will also help prepare light meals Community mobility: Public transportation Medication management: Independent, uses pill Nature conservation officer: [place] helps pay pills and writes checks Handwriting: Print 25% legible Cursive: 25% legible Pt. Was asked to write name on paper and instead of writing side to side Pt. wrote vertically  MOBILITY STATUS: Needs Assist: Wheelchair  POSTURE COMMENTS:  No Significant postural limitations Sitting balance: WFL  ACTIVITY TOLERANCE: Activity tolerance: WFL  FUNCTIONAL OUTCOME MEASURES: FOTO: 69 TR: 68  UPPER EXTREMITY ROM:    Active ROM Right WFL Left WFL  Shoulder flexion    Shoulder abduction    Shoulder adduction    Shoulder extension    Shoulder internal rotation    Shoulder external rotation    Elbow flexion    Elbow extension    Wrist flexion    Wrist extension    Wrist ulnar deviation    Wrist radial deviation    Wrist pronation    Wrist supination    (Blank rows = not tested)  UPPER EXTREMITY MMT:     MMT Right eval Right 12/22/2022 Left eval Left 12/22/2022  Shoulder flexion 4+/5 4+/5 4/5 4+/5  Shoulder abduction 4+/5 4+/5 4/5 4+/5  Shoulder adduction      Shoulder extension      Shoulder internal rotation      Shoulder external rotation      Middle trapezius      Lower trapezius      Elbow flexion 5/5 5/5 4+/5 4+/5  Elbow extension 5/5 5/5 4+/5 4+/5  Wrist flexion 4+/5 4+/5 4/5 4+/5  Wrist extension 4+/5 4+/5 4/5 4+/5  Wrist ulnar deviation      Wrist radial deviation      Wrist pronation      Wrist supination      (Blank rows = not tested)  HAND FUNCTION: Grip strength: Right: 40 lbs; Left: 44 lbs, Lateral pinch: Right: 7 lbs, Left: 8 lbs, and 3 point pinch: Right: 6 lbs, Left: 7 lbs 12/22/2022: Right: 40 lbs; Left: 40 lbs, Lateral pinch: Right: 11 lbs, Left: 8 lbs, and 3 point pinch: Right:  6 lbs, Left: 5 lbs  COORDINATION: 9 Hole Peg test: Right: 1 minute 10 sec; Left: 58 sec 12/22/2022: 9 Hole Peg test: Right: 1 minute & 1 sec; Left: 53 sec  SENSATION: Not tested  EDEMA: None  MUSCLE TONE: WFL  COGNITION: Overall cognitive status: Within functional limits for tasks assessed  VISION: Subjective report: Pt. Reports that she wears glasses for most of the time however takes them off sometimes. Baseline vision: Wear glasses most of the time Visual history: cataracts  PERCEPTION: WFL  PRAXIS: WFL   TODAY'S TREATMENT:  DATE: 12/24/2022   Therapeutic EX:  Pt. performed bilateral gross gripping with a gross grip strengthener. Pt. worked on sustaining grip while grasping pegs and reaching at various heights. The Gripper was set to  17.9# of grip strength resistance. Pt. Worked on pinch strengthening in the bilateral hand for lateral, and 3pt. pinch using yellow, red, green, blue, and black resistive clips. Pt. worked on placing the clips at various vertical and horizontal angles. Tactile and verbal cues were required for eliciting the desired movement. the patient. Was upgraded to blue theraputty. And tolerated the transition to blue theraputtty well.    PATIENT EDUCATION: Education details: handwriting strategies, measurements, goals. Person educated: Patient Education method: Explanation, Demonstration, and Verbal cues Education comprehension: verbalized understanding, demonstrated understanding; further training needed  HOME EXERCISE PROGRAM: Handwriting practice   GOALS: Goals reviewed with patient? Yes  SHORT TERM GOALS: Target date: 01/08/2023  Pt. Will improve FOTO score by 2 points to reflect improved perceived function performance in specific ADL/IADL. Baseline: 12/22/2022: FOTO: 77 FOTO: 69 TR: 68 Goal status: MET  LONG TERM  GOALS: Target date: 01/19/2023  Pt. Will improve R and L hand FMC skills by 10 seconds of speed to independently manipulate small objects during ADL/IADL tasks. Baseline: 12/22/2022:9 Hole Peg test: Right: 1 minute & 1 sec; Left: 53 sec  Eval: 9 Hole Peg test: Right: 1 minute 10 sec; Left: 58 sec Goal status: INITIAL  2.  Pt. Will improve L shoulder strength by 1 mm grade to be able to sustain LUE elevation while performing ADL/IADL tasks. Baseline: 12/22/2022: L: Shoulder flexion: 4/5, Shoulder abduction: 4/5 Eval: L: Shoulder flexion: 4/5, Shoulder abduction: 4/5 Goal status: INITIAL  3.  Pt. Will improve L hand FMC/dexterity skills to be able to sign her name on checks with 75% legibility, using writing aids as needed. Baseline: 12/22/2022: 12/22/2023: Print: 50% legibility, cursive 50% legibility Eval: Print: 25% legibility, Cursive: 25% legibility Goal status: INITIAL  ASSESSMENT:  CLINICAL IMPRESSION:  Pt. tolerated the UE exercises well this afternoon. Pt. requires verbal, and tactile cues, as well as cues for visual demonstration of proper form, and technique. Pt. continues to present with limited BUE strength, left grip strength, and pinch strength. Pt. Tolerated the upgrade to blue theraputty exercises. Pt. continues to benefit from OT services to improve LUE strength, and [facility] skills in order to improve writing legibility to increase independence and enhance engagement in ADL/IADL tasks.   PERFORMANCE DEFICITS: in functional skills including ADLs, IADLs, coordination, strength, and Fine motor control, cognitive skills including problem solving, and psychosocial skills including coping strategies, environmental adaptation, and routines and behaviors.   IMPAIRMENTS: are limiting patient from ADLs and IADLs.   CO-MORBIDITIES: may have co-morbidities  that affects occupational performance. Patient will benefit from skilled OT to address above impairments and improve overall  function.  MODIFICATION OR ASSISTANCE TO COMPLETE EVALUATION: Min-Moderate modification of tasks or assist with assess necessary to complete an evaluation.  OT OCCUPATIONAL PROFILE AND HISTORY: Detailed assessment: Review of records and additional review of physical, cognitive, psychosocial history related to current functional performance.  CLINICAL DECISION MAKING: Moderate - several treatment options, min-mod task modification necessary  REHAB POTENTIAL: Good  EVALUATION COMPLEXITY: Moderate    PLAN:  OT FREQUENCY: 2x/week  OT DURATION: 12 weeks  PLANNED INTERVENTIONS: self care/ADL training, therapeutic exercise, therapeutic activity, neuromuscular re-education, and patient/family education  RECOMMENDED OTHER SERVICES: N/A  CONSULTED AND AGREED WITH PLAN OF CARE: Patient  PLAN FOR NEXT SESSION: Initiate treatment  Olegario Messier, MS, OTR/L 12/24/2022, 3:51 PM

## 2022-12-29 ENCOUNTER — Ambulatory Visit: Payer: Medicare Other | Admitting: Occupational Therapy

## 2022-12-30 ENCOUNTER — Ambulatory Visit: Payer: Medicare Other | Admitting: Occupational Therapy

## 2022-12-30 NOTE — Telephone Encounter (Signed)
Pt. did not arrive to therapy this afternoon. Multiple attempts were made to reach out to the Pt. via telephone with no response.

## 2022-12-31 ENCOUNTER — Ambulatory Visit: Payer: Medicare Other | Admitting: Occupational Therapy

## 2023-01-01 ENCOUNTER — Ambulatory Visit: Payer: Medicare Other | Admitting: Occupational Therapy

## 2023-01-01 NOTE — Telephone Encounter (Signed)
Pt. did not arrive for her therapy appointment this afternoon. An attempt was made to reach out to the Pt. Via telephone with no response.

## 2023-01-05 ENCOUNTER — Ambulatory Visit: Payer: Medicare Other | Admitting: Occupational Therapy

## 2023-01-05 DIAGNOSIS — R278 Other lack of coordination: Secondary | ICD-10-CM

## 2023-01-05 DIAGNOSIS — M6281 Muscle weakness (generalized): Secondary | ICD-10-CM

## 2023-01-05 NOTE — Therapy (Addendum)
OCCUPATIONAL THERAPY NEURO TREATMENT NOTE  Patient Name: Susan [name] MRN: [id number] DOB:October 28, 1926, 89 y.o., female Today's Date: 01/05/2023  PCP: [name] REFERRING PROVIDER: [name]  END OF SESSION:  OT End of Session - 01/05/23 1437     Visit Number 12    Number of Visits 24    Date for OT Re-Evaluation 01/19/23    OT Start Time 1315    OT Stop Time 1400    OT Time Calculation (min) 45 min    Activity Tolerance Patient tolerated treatment well    Behavior During Therapy [facility] for tasks assessed/performed            Past Medical History:  Diagnosis Date   Anemia    Balance problem    Diabetes mellitus without complication (HCC)    Diverticulosis    Heart murmur    Hypercholesteremia    Hypertension    Stroke [facility])    TIA 2006   TIA (transient ischemic attack) 2006   Past Surgical History:  Procedure Laterality Date   CATARACT EXTRACTION W/PHACO Left 05/22/2015   Procedure: CATARACT EXTRACTION PHACO AND INTRAOCULAR LENS PLACEMENT (IOC);  Surgeon: Galen Manila, MD;  Location: [facility] ORS;  Service: Ophthalmology;  Laterality: Left;  [place]: 01:07.9 AP%: 23.0 CDE: 15.62 Lot# [id number]   CATARACT EXTRACTION W/PHACO Right 06/12/2015   Procedure: CATARACT EXTRACTION PHACO AND INTRAOCULAR LENS PLACEMENT (IOC);  Surgeon: Galen Manila, MD;  Location: [facility] ORS;  Service: Ophthalmology;  Laterality: Right;  [place] 00:57 AP% 17.1 CDE 9.86 fluid pack lot # [id number]   COLON SURGERY     resection   COLONOSCOPY WITH PROPOFOL N/A 10/29/2021   Procedure: COLONOSCOPY WITH PROPOFOL;  Surgeon: Regis Bill, MD;  Location: [facility] ENDOSCOPY;  Service: Endoscopy;  Laterality: N/A;   ECTOPIC PREGNANCY SURGERY     HERNIA REPAIR     TONSILLECTOMY     Patient Active Problem List   Diagnosis Date Noted   Renal lesion, right 10/30/2021   Acute blood loss anemia 10/30/2021   Lower GI bleed 10/27/2021   GI bleeding 05/29/2021   CVA (cerebral vascular accident) (HCC) 07/04/2017     ONSET DATE: 04/2018  REFERRING DIAG: CVA  THERAPY DIAG:  Muscle weakness (generalized)  Other lack of coordination  Rationale for Evaluation and Treatment: Rehabilitation  SUBJECTIVE:   SUBJECTIVE STATEMENT: Pt. reports having had some confusion with the schedule last week. Pt accompanied by: self  PERTINENT HISTORY: Pt. Is a 89 y.o. female who has a past medical history of CVA, hypertension, type ll DM, chronic anemia, history of diverticular bleed in February 2023. Pt. Has had a recent fall on 02/22/2022 and was found laying on the floor and had been laying there for a couple of days. Pt. Presents with generalized weakness and was referred to OT by her PCP to help with decrease in handwriting skills over the last few years.  PRECAUTIONS: None   WEIGHT BEARING RESTRICTIONS: No  PAIN:  Are you having pain? No  FALLS: Has patient fallen in last 6 months? Yes. Number of falls 1-2  LIVING ENVIRONMENT: Lives with: lives alone Lives in: Independent living facility that has apartments called "[facility]" Stairs: Engineer, structural Has following equipment at home: Dan Humphreys - 2 wheeled, Environmental consultant - 4 wheeled, Wheelchair (power), shower chair, and Grab bars  PLOF: Independent  PATIENT GOALS: Desires to improve handwriting so that she is able to write her own checks  OBJECTIVE:   HAND DOMINANCE: Left  ADLs:  Eating: Independent  with feeding self Grooming: Independent UB Dressing: Able to put blouse on, does not wear shirt with buttons LB Dressing: Able to dress self with elastic pants, able to put socks and shoes Toileting: Independent Bathing: Pt. Has someone who comes and gives her a shower Tub Shower transfers: Depends on Aid to help get in and out of the walk in shower Equipment: Shower seat with back, Grab bars, Walk in shower, and bed side commode  IADLs: Shopping: Grand daughter does the grocery shopping however Pt. Reports going with her sometimes Light housekeeping: Pt.  States she keeps a clean house and can vacuum from her wheelchair Meal Prep: Meals on wheels brings her food, grand daughter will also help prepare light meals Community mobility: Public transportation Medication management: Independent, uses pill Nature conservation officer: Granddaguther helps pay pills and writes checks Handwriting: Print 25% legible Cursive: 25% legible Pt. Was asked to write name on paper and instead of writing side to side Pt. wrote vertically  MOBILITY STATUS: Needs Assist: Wheelchair  POSTURE COMMENTS:  No Significant postural limitations Sitting balance: WFL  ACTIVITY TOLERANCE: Activity tolerance: WFL  FUNCTIONAL OUTCOME MEASURES: FOTO: 69 TR: 68  UPPER EXTREMITY ROM:    Active ROM Right WFL Left WFL  Shoulder flexion    Shoulder abduction    Shoulder adduction    Shoulder extension    Shoulder internal rotation    Shoulder external rotation    Elbow flexion    Elbow extension    Wrist flexion    Wrist extension    Wrist ulnar deviation    Wrist radial deviation    Wrist pronation    Wrist supination    (Blank rows = not tested)  UPPER EXTREMITY MMT:     MMT Right eval Right 12/22/2022 Left eval Left 12/22/2022  Shoulder flexion 4+/5 4+/5 4/5 4+/5  Shoulder abduction 4+/5 4+/5 4/5 4+/5  Shoulder adduction      Shoulder extension      Shoulder internal rotation      Shoulder external rotation      Middle trapezius      Lower trapezius      Elbow flexion 5/5 5/5 4+/5 4+/5  Elbow extension 5/5 5/5 4+/5 4+/5  Wrist flexion 4+/5 4+/5 4/5 4+/5  Wrist extension 4+/5 4+/5 4/5 4+/5  Wrist ulnar deviation      Wrist radial deviation      Wrist pronation      Wrist supination      (Blank rows = not tested)  HAND FUNCTION: Grip strength: Right: 40 lbs; Left: 44 lbs, Lateral pinch: Right: 7 lbs, Left: 8 lbs, and 3 point pinch: Right: 6 lbs, Left: 7 lbs 12/22/2022: Right: 40 lbs; Left: 40 lbs, Lateral pinch: Right: 11 lbs, Left: 8 lbs,  and 3 point pinch: Right: 6 lbs, Left: 5 lbs  COORDINATION: 9 Hole Peg test: Right: 1 minute 10 sec; Left: 58 sec 12/22/2022: 9 Hole Peg test: Right: 1 minute & 1 sec; Left: 53 sec  SENSATION: Not tested  EDEMA: None  MUSCLE TONE: WFL  COGNITION: Overall cognitive status: Within functional limits for tasks assessed  VISION: Subjective report: Pt. Reports that she wears glasses for most of the time however takes them off sometimes. Baseline vision: Wear glasses most of the time Visual history: cataracts  PERCEPTION: WFL  PRAXIS: WFL   TODAY'S TREATMENT:  DATE: 01/05/2023   Therapeutic EX:  Pt. worked on pinch strengthening in the left hand for lateral, and 3pt. pinch using yellow, red, green, blue, and black resistive clips. Pt. worked on placing the clips at various vertical and horizontal angles. Tactile and verbal cues were required for eliciting the desired movement.   Self-care:  Pt. worked on Data processing manager during simulated check writing tasks. Pt. was able to formulate written text with 25% legibility in cursive form.   Neuromuscular re-education:  Pt. performed FMC tasks using the grooved pegboard. Pt. worked on grasping the grooved pegs from a horizontal position, and moving the pegs to a vertical position in the hand to prepare for placing them in the grooved slot. Pt. Worked on storing pegs in the palm of her left hand, and dropping them one at a time from the ulnar aspect of the hand.    PATIENT EDUCATION: Education details: handwriting strategies, measurements, goals. Person educated: Patient Education method: Explanation, Demonstration, and Verbal cues Education comprehension: verbalized understanding, demonstrated understanding; further training needed  HOME EXERCISE PROGRAM: Handwriting practice   GOALS: Goals reviewed  with patient? Yes  SHORT TERM GOALS: Target date: 01/08/2023  Pt. Will improve FOTO score by 2 points to reflect improved perceived function performance in specific ADL/IADL. Baseline: 12/22/2022: FOTO: 77 FOTO: 69 TR: 68 Goal status: MET  LONG TERM GOALS: Target date: 01/19/2023  Pt. Will improve R and L hand FMC skills by 10 seconds of speed to independently manipulate small objects during ADL/IADL tasks. Baseline: 12/22/2022:9 Hole Peg test: Right: 1 minute & 1 sec; Left: 53 sec  Eval: 9 Hole Peg test: Right: 1 minute 10 sec; Left: 58 sec Goal status: INITIAL  2.  Pt. Will improve L shoulder strength by 1 mm grade to be able to sustain LUE elevation while performing ADL/IADL tasks. Baseline: 12/22/2022: L: Shoulder flexion: 4/5, Shoulder abduction: 4/5 Eval: L: Shoulder flexion: 4/5, Shoulder abduction: 4/5 Goal status: INITIAL  3.  Pt. Will improve L hand FMC/dexterity skills to be able to sign her name on checks with 75% legibility, using writing aids as needed. Baseline: 12/22/2022: 12/22/2023: Print: 50% legibility, cursive 50% legibility Eval: Print: 25% legibility, Cursive: 25% legibility Goal status: INITIAL  ASSESSMENT:  CLINICAL IMPRESSION:  Pt. presents with 25% legibility in cursive form during checking writing tasks using a standard pen. Pt. required cues for hand placement, and digit pinch position on the yellow, red, green, and blue resistive clips. Pt. presented with difficulty turing the grooved pegs within the tips of her digits in preparation for placing the into the grooved pegboard, dropping several pegs form her fingers. Pt. continues to benefit from OT services to improve LUE strength, and [facility] skills in order to improve writing legibility to increase independence and enhance engagement in ADL/IADL tasks.   PERFORMANCE DEFICITS: in functional skills including ADLs, IADLs, coordination, strength, and Fine motor control, cognitive skills including problem solving, and  psychosocial skills including coping strategies, environmental adaptation, and routines and behaviors.   IMPAIRMENTS: are limiting patient from ADLs and IADLs.   CO-MORBIDITIES: may have co-morbidities  that affects occupational performance. Patient will benefit from skilled OT to address above impairments and improve overall function.  MODIFICATION OR ASSISTANCE TO COMPLETE EVALUATION: Min-Moderate modification of tasks or assist with assess necessary to complete an evaluation.  OT OCCUPATIONAL PROFILE AND HISTORY: Detailed assessment: Review of records and additional review of physical, cognitive, psychosocial history related to current functional performance.  CLINICAL DECISION MAKING: Moderate -  several treatment options, min-mod task modification necessary  REHAB POTENTIAL: Good  EVALUATION COMPLEXITY: Moderate    PLAN:  OT FREQUENCY: 2x/week  OT DURATION: 12 weeks  PLANNED INTERVENTIONS: self care/ADL training, therapeutic exercise, therapeutic activity, neuromuscular re-education, and patient/family education  RECOMMENDED OTHER SERVICES: N/A  CONSULTED AND AGREED WITH PLAN OF CARE: Patient  PLAN FOR NEXT SESSION: Initiate treatment  Olegario Messier, MS, OTR/L 01/05/2023, 2:39 PM

## 2023-01-07 ENCOUNTER — Ambulatory Visit: Payer: Medicare Other | Admitting: Occupational Therapy

## 2023-01-07 NOTE — Telephone Encounter (Signed)
Pt. did not arrive for her therapy appointment this afternoon. A telephone call was attempted to reach out to the Pt., with no response.

## 2023-01-12 ENCOUNTER — Ambulatory Visit: Payer: Medicare Other | Admitting: Occupational Therapy

## 2023-01-12 ENCOUNTER — Encounter: Payer: Self-pay | Admitting: Occupational Therapy

## 2023-01-12 DIAGNOSIS — R278 Other lack of coordination: Secondary | ICD-10-CM | POA: Diagnosis not present

## 2023-01-12 DIAGNOSIS — M6281 Muscle weakness (generalized): Secondary | ICD-10-CM

## 2023-01-12 NOTE — Therapy (Signed)
OCCUPATIONAL THERAPY NEURO TREATMENT NOTE  Patient Name: Susan [name] MRN: [id number] DOB:Oct 17, 1926, 89 y.o., female Today's Date: 01/12/2023  PCP: [name] REFERRING PROVIDER: [name]  END OF SESSION:  OT End of Session - 01/12/23 1352     Visit Number 13    Number of Visits 24    Date for OT Re-Evaluation 01/19/23    OT Start Time 1355    OT Stop Time 1440    OT Time Calculation (min) 45 min    Activity Tolerance Patient tolerated treatment well    Behavior During Therapy [facility] for tasks assessed/performed            Past Medical History:  Diagnosis Date   Anemia    Balance problem    Diabetes mellitus without complication (HCC)    Diverticulosis    Heart murmur    Hypercholesteremia    Hypertension    Stroke [facility])    TIA 2006   TIA (transient ischemic attack) 2006   Past Surgical History:  Procedure Laterality Date   CATARACT EXTRACTION W/PHACO Left 05/22/2015   Procedure: CATARACT EXTRACTION PHACO AND INTRAOCULAR LENS PLACEMENT (IOC);  Surgeon: Galen Manila, MD;  Location: [facility] ORS;  Service: Ophthalmology;  Laterality: Left;  [place]: 01:07.9 AP%: 23.0 CDE: 15.62 Lot# [id number]   CATARACT EXTRACTION W/PHACO Right 06/12/2015   Procedure: CATARACT EXTRACTION PHACO AND INTRAOCULAR LENS PLACEMENT (IOC);  Surgeon: Galen Manila, MD;  Location: [facility] ORS;  Service: Ophthalmology;  Laterality: Right;  [place] 00:57 AP% 17.1 CDE 9.86 fluid pack lot # [id number]   COLON SURGERY     resection   COLONOSCOPY WITH PROPOFOL N/A 10/29/2021   Procedure: COLONOSCOPY WITH PROPOFOL;  Surgeon: Regis Bill, MD;  Location: [facility] ENDOSCOPY;  Service: Endoscopy;  Laterality: N/A;   ECTOPIC PREGNANCY SURGERY     HERNIA REPAIR     TONSILLECTOMY     Patient Active Problem List   Diagnosis Date Noted   Renal lesion, right 10/30/2021   Acute blood loss anemia 10/30/2021   Lower GI bleed 10/27/2021   GI bleeding 05/29/2021   CVA (cerebral vascular accident) (HCC) 07/04/2017     ONSET DATE: 04/2018  REFERRING DIAG: CVA  THERAPY DIAG:  Other lack of coordination  Muscle weakness (generalized)  Rationale for Evaluation and Treatment: Rehabilitation  SUBJECTIVE:   SUBJECTIVE STATEMENT: Pt. reports working on her putty and handwriting at home. Pt accompanied by: self  PERTINENT HISTORY: Pt. Is a 89 y.o. female who has a past medical history of CVA, hypertension, type ll DM, chronic anemia, history of diverticular bleed in February 2023. Pt. Has had a recent fall on 02/22/2022 and was found laying on the floor and had been laying there for a couple of days. Pt. Presents with generalized weakness and was referred to OT by her PCP to help with decrease in handwriting skills over the last few years.  PRECAUTIONS: None   WEIGHT BEARING RESTRICTIONS: No  PAIN:  Are you having pain? No  FALLS: Has patient fallen in last 6 months? Yes. Number of falls 1-2  LIVING ENVIRONMENT: Lives with: lives alone Lives in: Independent living facility that has apartments called "[facility]" Stairs: Engineer, structural Has following equipment at home: Dan Humphreys - 2 wheeled, Environmental consultant - 4 wheeled, Wheelchair (power), shower chair, and Grab bars  PLOF: Independent  PATIENT GOALS: Desires to improve handwriting so that she is able to write her own checks  OBJECTIVE:   HAND DOMINANCE: Left  ADLs:  Eating: Independent with  feeding self Grooming: Independent UB Dressing: Able to put blouse on, does not wear shirt with buttons LB Dressing: Able to dress self with elastic pants, able to put socks and shoes Toileting: Independent Bathing: Pt. Has someone who comes and gives her a shower Tub Shower transfers: Depends on Aid to help get in and out of the walk in shower Equipment: Shower seat with back, Grab bars, Walk in shower, and bed side commode  IADLs: Shopping: Grand daughter does the grocery shopping however Pt. Reports going with her sometimes Light housekeeping: Pt. States  she keeps a clean house and can vacuum from her wheelchair Meal Prep: Meals on wheels brings her food, grand daughter will also help prepare light meals Community mobility: Public transportation Medication management: Independent, uses pill Nature conservation officer: [place] helps pay pills and writes checks Handwriting: Print 25% legible Cursive: 25% legible Pt. Was asked to write name on paper and instead of writing side to side Pt. wrote vertically  MOBILITY STATUS: Needs Assist: Wheelchair  POSTURE COMMENTS:  No Significant postural limitations Sitting balance: WFL  ACTIVITY TOLERANCE: Activity tolerance: WFL  FUNCTIONAL OUTCOME MEASURES: FOTO: 69 TR: 68  UPPER EXTREMITY ROM:  WFL  UPPER EXTREMITY MMT:     MMT Right eval Right 12/22/2022 Left eval Left 12/22/2022  Shoulder flexion 4+/5 4+/5 4/5 4+/5  Shoulder abduction 4+/5 4+/5 4/5 4+/5  Shoulder adduction      Shoulder extension      Shoulder internal rotation      Shoulder external rotation      Middle trapezius      Lower trapezius      Elbow flexion 5/5 5/5 4+/5 4+/5  Elbow extension 5/5 5/5 4+/5 4+/5  Wrist flexion 4+/5 4+/5 4/5 4+/5  Wrist extension 4+/5 4+/5 4/5 4+/5  Wrist ulnar deviation      Wrist radial deviation      Wrist pronation      Wrist supination      (Blank rows = not tested)  HAND FUNCTION: Grip strength: Right: 40 lbs; Left: 44 lbs, Lateral pinch: Right: 7 lbs, Left: 8 lbs, and 3 point pinch: Right: 6 lbs, Left: 7 lbs 12/22/2022: Right: 40 lbs; Left: 40 lbs, Lateral pinch: Right: 11 lbs, Left: 8 lbs, and 3 point pinch: Right: 6 lbs, Left: 5 lbs  COORDINATION: 9 Hole Peg test: Right: 1 minute 10 sec; Left: 58 sec 12/22/2022: 9 Hole Peg test: Right: 1 minute & 1 sec; Left: 53 sec  SENSATION: Not tested  MUSCLE TONE: WFL  COGNITION: Overall cognitive status: Within functional limits for tasks assessed  VISION: Subjective report: Pt. Reports that she wears glasses for most  of the time however takes them off sometimes. Baseline vision: Wear glasses most of the time Visual history: cataracts  PERCEPTION: WFL  PRAXIS: WFL   TODAY'S TREATMENT:  DATE: 01/12/2023   Therapeutic EX: Reviewed HEP - good recall of exercises for red and blue theraputty exercises. Pt performed 2# dowel ex. For UE strengthening secondary to weakness. Bilateral shoulder flexion, chest press, circular patterns, and elbow flexion/extension 1 set x 10 reps each in sitting.   Self-care: Pt. worked on Data processing manager; formulated written text with 75% legibility in print form copying names. Decreases to 50% legibility as pt fatigues. 50% legibility to sign name in cursive.   Neuromuscular re-education: Pt performed [facility] tasks using the Purdue pegboard. Pt worked on grasping the pegs/washer/and hollow bead from small cups, and moving the to a vertical position in the hand to prepare for placing them on board. Focused on storing pegs in the palm of her left hand while translating washer to finger tip. R tremor noted, increased difficulty threading hollow beads onto pegs - suspect r/t vision.   PATIENT EDUCATION: Education details: handwriting strategies, measurements, goals. Person educated: Patient Education method: Explanation, Demonstration, and Verbal cues Education comprehension: verbalized understanding, demonstrated understanding; further training needed  HOME EXERCISE PROGRAM: Handwriting practice   GOALS: Goals reviewed with patient? Yes  SHORT TERM GOALS: Target date: 01/08/2023  Pt. Will improve FOTO score by 2 points to reflect improved perceived function performance in specific ADL/IADL. Baseline: 12/22/2022: FOTO: 77 FOTO: 69 TR: 68 Goal status: MET  LONG TERM GOALS: Target date: 01/19/2023  Pt. Will improve R and L hand FMC skills by 10  seconds of speed to independently manipulate small objects during ADL/IADL tasks. Baseline: 12/22/2022:9 Hole Peg test: Right: 1 minute & 1 sec; Left: 53 sec  Eval: 9 Hole Peg test: Right: 1 minute 10 sec; Left: 58 sec Goal status: INITIAL  2.  Pt. Will improve L shoulder strength by 1 mm grade to be able to sustain LUE elevation while performing ADL/IADL tasks. Baseline: 12/22/2022: L: Shoulder flexion: 4/5, Shoulder abduction: 4/5 Eval: L: Shoulder flexion: 4/5, Shoulder abduction: 4/5 Goal status: INITIAL  3.  Pt. Will improve L hand FMC/dexterity skills to be able to sign her name on checks with 75% legibility, using writing aids as needed. Baseline: 12/22/2022: 12/22/2023: Print: 50% legibility, cursive 50% legibility Eval: Print: 25% legibility, Cursive: 25% legibility Goal status: INITIAL  ASSESSMENT:  CLINICAL IMPRESSION:  Pt. presents with 75% legibility in print form using The Pencil Grip and 50% cursive, decreases to 25% as pt fatigues. Reviewed HEP with good recall. Tolerated 2# dowel BUE exercises with cues for form. Manipulated pegs and washers in Purdue pegboard - increased difficulty manipulating hollow beads to thread on peg r/t R tremor and depth perception. Pt. continues to benefit from OT services to improve LUE strength, and [facility] skills in order to improve writing legibility to increase independence and enhance engagement in ADL/IADL tasks.   PERFORMANCE DEFICITS: in functional skills including ADLs, IADLs, coordination, strength, and Fine motor control, cognitive skills including problem solving, and psychosocial skills including coping strategies, environmental adaptation, and routines and behaviors.   IMPAIRMENTS: are limiting patient from ADLs and IADLs.   CO-MORBIDITIES: may have co-morbidities  that affects occupational performance. Patient will benefit from skilled OT to address above impairments and improve overall function.  MODIFICATION OR ASSISTANCE TO COMPLETE  EVALUATION: Min-Moderate modification of tasks or assist with assess necessary to complete an evaluation.  OT OCCUPATIONAL PROFILE AND HISTORY: Detailed assessment: Review of records and additional review of physical, cognitive, psychosocial history related to current functional performance.  CLINICAL DECISION MAKING: Moderate - several treatment options, min-mod  task modification necessary  REHAB POTENTIAL: Good  EVALUATION COMPLEXITY: Moderate    PLAN:  OT FREQUENCY: 2x/week  OT DURATION: 12 weeks  PLANNED INTERVENTIONS: self care/ADL training, therapeutic exercise, therapeutic activity, neuromuscular re-education, and patient/family education  RECOMMENDED OTHER SERVICES: N/A  CONSULTED AND AGREED WITH PLAN OF CARE: Patient  PLAN FOR NEXT SESSION: Initiate treatment   Kathie Dike, M.S. OTR/L  01/12/23, 1:54 PM  ascom [phone]  01/12/2023, 1:53 PM

## 2023-01-14 ENCOUNTER — Ambulatory Visit: Payer: Medicare Other | Attending: Family Medicine | Admitting: Occupational Therapy

## 2023-01-14 DIAGNOSIS — M6281 Muscle weakness (generalized): Secondary | ICD-10-CM | POA: Insufficient documentation

## 2023-01-14 DIAGNOSIS — R278 Other lack of coordination: Secondary | ICD-10-CM | POA: Diagnosis present

## 2023-01-14 NOTE — Therapy (Signed)
OCCUPATIONAL THERAPY NEURO TREATMENT NOTE  Patient Name: Susan [name] MRN: [id number] DOB:02-Oct-1926, 89 y.o., female Today's Date: 01/14/2023  PCP: [name] REFERRING PROVIDER: [name]  END OF SESSION:  OT End of Session - 01/14/23 1440     Visit Number 14    Number of Visits 24    Date for OT Re-Evaluation 01/19/23    OT Start Time 1347    OT Stop Time 1432    OT Time Calculation (min) 45 min    Activity Tolerance Patient tolerated treatment well    Behavior During Therapy [facility] for tasks assessed/performed            Past Medical History:  Diagnosis Date   Anemia    Balance problem    Diabetes mellitus without complication (HCC)    Diverticulosis    Heart murmur    Hypercholesteremia    Hypertension    Stroke [facility])    TIA 2006   TIA (transient ischemic attack) 2006   Past Surgical History:  Procedure Laterality Date   CATARACT EXTRACTION W/PHACO Left 05/22/2015   Procedure: CATARACT EXTRACTION PHACO AND INTRAOCULAR LENS PLACEMENT (IOC);  Surgeon: Galen Manila, MD;  Location: [facility] ORS;  Service: Ophthalmology;  Laterality: Left;  [place]: 01:07.9 AP%: 23.0 CDE: 15.62 Lot# [id number]   CATARACT EXTRACTION W/PHACO Right 06/12/2015   Procedure: CATARACT EXTRACTION PHACO AND INTRAOCULAR LENS PLACEMENT (IOC);  Surgeon: Galen Manila, MD;  Location: [facility] ORS;  Service: Ophthalmology;  Laterality: Right;  [place] 00:57 AP% 17.1 CDE 9.86 fluid pack lot # [id number]   COLON SURGERY     resection   COLONOSCOPY WITH PROPOFOL N/A 10/29/2021   Procedure: COLONOSCOPY WITH PROPOFOL;  Surgeon: Regis Bill, MD;  Location: [facility] ENDOSCOPY;  Service: Endoscopy;  Laterality: N/A;   ECTOPIC PREGNANCY SURGERY     HERNIA REPAIR     TONSILLECTOMY     Patient Active Problem List   Diagnosis Date Noted   Renal lesion, right 10/30/2021   Acute blood loss anemia 10/30/2021   Lower GI bleed 10/27/2021   GI bleeding 05/29/2021   CVA (cerebral vascular accident) (HCC) 07/04/2017     ONSET DATE: 04/2018  REFERRING DIAG: CVA  THERAPY DIAG:  Muscle weakness (generalized)  Rationale for Evaluation and Treatment: Rehabilitation  SUBJECTIVE:   SUBJECTIVE STATEMENT: Pt. reports that she made a garden salad for a friend's birthday party this past weekend. Pt accompanied by: self  PERTINENT HISTORY: Pt. Is a 89 y.o. female who has a past medical history of CVA, hypertension, type ll DM, chronic anemia, history of diverticular bleed in February 2023. Pt. Has had a recent fall on 02/22/2022 and was found laying on the floor and had been laying there for a couple of days. Pt. Presents with generalized weakness and was referred to OT by her PCP to help with decrease in handwriting skills over the last few years.  PRECAUTIONS: None   WEIGHT BEARING RESTRICTIONS: No  PAIN:  Are you having pain? No  FALLS: Has patient fallen in last 6 months? Yes. Number of falls 1-2  LIVING ENVIRONMENT: Lives with: lives alone Lives in: Independent living facility that has apartments called "[facility]" Stairs: Engineer, structural Has following equipment at home: Dan Humphreys - 2 wheeled, Environmental consultant - 4 wheeled, Wheelchair (power), shower chair, and Grab bars  PLOF: Independent  PATIENT GOALS: Desires to improve handwriting so that she is able to write her own checks  OBJECTIVE:   HAND DOMINANCE: Left  ADLs:  Eating: Independent  with feeding self Grooming: Independent UB Dressing: Able to put blouse on, does not wear shirt with buttons LB Dressing: Able to dress self with elastic pants, able to put socks and shoes Toileting: Independent Bathing: Pt. Has someone who comes and gives her a shower Tub Shower transfers: Depends on Aid to help get in and out of the walk in shower Equipment: Shower seat with back, Grab bars, Walk in shower, and bed side commode  IADLs: Shopping: Grand daughter does the grocery shopping however Pt. Reports going with her sometimes Light housekeeping: Pt. States  she keeps a clean house and can vacuum from her wheelchair Meal Prep: Meals on wheels brings her food, grand daughter will also help prepare light meals Community mobility: Public transportation Medication management: Independent, uses pill Nature conservation officer: [place] helps pay pills and writes checks Handwriting: Print 25% legible Cursive: 25% legible Pt. Was asked to write name on paper and instead of writing side to side Pt. wrote vertically  MOBILITY STATUS: Needs Assist: Wheelchair  POSTURE COMMENTS:  No Significant postural limitations Sitting balance: WFL  ACTIVITY TOLERANCE: Activity tolerance: WFL  FUNCTIONAL OUTCOME MEASURES: FOTO: 69 TR: 68  UPPER EXTREMITY ROM:  WFL  UPPER EXTREMITY MMT:     MMT Right eval Right 12/22/2022 Left eval Left 12/22/2022  Shoulder flexion 4+/5 4+/5 4/5 4+/5  Shoulder abduction 4+/5 4+/5 4/5 4+/5  Shoulder adduction      Shoulder extension      Shoulder internal rotation      Shoulder external rotation      Middle trapezius      Lower trapezius      Elbow flexion 5/5 5/5 4+/5 4+/5  Elbow extension 5/5 5/5 4+/5 4+/5  Wrist flexion 4+/5 4+/5 4/5 4+/5  Wrist extension 4+/5 4+/5 4/5 4+/5  Wrist ulnar deviation      Wrist radial deviation      Wrist pronation      Wrist supination      (Blank rows = not tested)  HAND FUNCTION: Grip strength: Right: 40 lbs; Left: 44 lbs, Lateral pinch: Right: 7 lbs, Left: 8 lbs, and 3 point pinch: Right: 6 lbs, Left: 7 lbs 12/22/2022: Right: 40 lbs; Left: 40 lbs, Lateral pinch: Right: 11 lbs, Left: 8 lbs, and 3 point pinch: Right: 6 lbs, Left: 5 lbs  COORDINATION: 9 Hole Peg test: Right: 1 minute 10 sec; Left: 58 sec 12/22/2022: 9 Hole Peg test: Right: 1 minute & 1 sec; Left: 53 sec  SENSATION: Not tested  MUSCLE TONE: WFL  COGNITION: Overall cognitive status: Within functional limits for tasks assessed  VISION: Subjective report: Pt. Reports that she wears glasses for most  of the time however takes them off sometimes. Baseline vision: Wear glasses most of the time Visual history: cataracts  PERCEPTION: WFL  PRAXIS: WFL   TODAY'S TREATMENT:  DATE: 01/18/2023   Neuromuscular re-education:  Pt. worked on left hand [facility] skills grasping 1" sticks, 1/4" collars, and 1/4" washers. Pt. worked on storing the objects in the palm, and translatory skills moving the items from the palm of the hand to the tip of the 2nd digit, and thumb. Pt. worked on removing the pegs using bilateral alternating hand patterns.   PATIENT EDUCATION: Education details: handwriting strategies, measurements, goals. Person educated: Patient Education method: Explanation, Demonstration, and Verbal cues Education comprehension: verbalized understanding, demonstrated understanding; further training needed  HOME EXERCISE PROGRAM: Handwriting practice   GOALS: Goals reviewed with patient? Yes  SHORT TERM GOALS: Target date: 01/08/2023  Pt. Will improve FOTO score by 2 points to reflect improved perceived function performance in specific ADL/IADL. Baseline: 12/22/2022: FOTO: 77 FOTO: 69 TR: 68 Goal status: MET  LONG TERM GOALS: Target date: 01/19/2023  Pt. Will improve R and L hand FMC skills by 10 seconds of speed to independently manipulate small objects during ADL/IADL tasks. Baseline: 12/22/2022:9 Hole Peg test: Right: 1 minute & 1 sec; Left: 53 sec  Eval: 9 Hole Peg test: Right: 1 minute 10 sec; Left: 58 sec Goal status: INITIAL  2.  Pt. Will improve L shoulder strength by 1 mm grade to be able to sustain LUE elevation while performing ADL/IADL tasks. Baseline: 12/22/2022: L: Shoulder flexion: 4/5, Shoulder abduction: 4/5 Eval: L: Shoulder flexion: 4/5, Shoulder abduction: 4/5 Goal status: INITIAL  3.  Pt. Will improve L hand FMC/dexterity skills to be able  to sign her name on checks with 75% legibility, using writing aids as needed. Baseline: 12/22/2022: 12/22/2023: Print: 50% legibility, cursive 50% legibility Eval: Print: 25% legibility, Cursive: 25% legibility Goal status: INITIAL  ASSESSMENT:  CLINICAL IMPRESSION:  Pt. was able to grasp the small objects with her left hand, and store them in the palm of her hand. Pt. was able to manipulate the small objects, however, occasionally required visual cues, and assist for placement.  Pt. required cues for bilateral alternating hand patterns. Pt. continues to benefit from OT services to improve LUE strength, and [facility] skills in order to improve writing legibility to increase independence and enhance engagement in ADL/IADL tasks.   PERFORMANCE DEFICITS: in functional skills including ADLs, IADLs, coordination, strength, and Fine motor control, cognitive skills including problem solving, and psychosocial skills including coping strategies, environmental adaptation, and routines and behaviors.   IMPAIRMENTS: are limiting patient from ADLs and IADLs.   CO-MORBIDITIES: may have co-morbidities  that affects occupational performance. Patient will benefit from skilled OT to address above impairments and improve overall function.  MODIFICATION OR ASSISTANCE TO COMPLETE EVALUATION: Min-Moderate modification of tasks or assist with assess necessary to complete an evaluation.  OT OCCUPATIONAL PROFILE AND HISTORY: Detailed assessment: Review of records and additional review of physical, cognitive, psychosocial history related to current functional performance.  CLINICAL DECISION MAKING: Moderate - several treatment options, min-mod task modification necessary  REHAB POTENTIAL: Good  EVALUATION COMPLEXITY: Moderate    PLAN:  OT FREQUENCY: 2x/week  OT DURATION: 12 weeks  PLANNED INTERVENTIONS: self care/ADL training, therapeutic exercise, therapeutic activity, neuromuscular re-education, and patient/family  education  RECOMMENDED OTHER SERVICES: N/A  CONSULTED AND AGREED WITH PLAN OF CARE: Patient  PLAN FOR NEXT SESSION: Initiate treatment   Olegario Messier, MS, OTR/L  01/14/23, 2:47 PM

## 2023-01-19 ENCOUNTER — Ambulatory Visit: Payer: Medicare Other | Admitting: Occupational Therapy

## 2023-01-19 ENCOUNTER — Telehealth: Payer: Self-pay | Admitting: Occupational Therapy

## 2023-01-19 NOTE — Telephone Encounter (Signed)
Pt. did not arrive for therapy this morning. An attempt was made to reach out to the Pt. via a follow-up phone call with no response.

## 2023-01-21 ENCOUNTER — Ambulatory Visit: Payer: Medicare Other | Admitting: Occupational Therapy

## 2023-01-21 DIAGNOSIS — M6281 Muscle weakness (generalized): Secondary | ICD-10-CM

## 2023-01-21 NOTE — Therapy (Signed)
OCCUPATIONAL THERAPY NEURO TREATMENT/RECERTIFICATION NOTE  Patient Name: Susan [name] MRN: [id number] DOB:October 17, 1926, 89 y.o., female Today's Date: 01/21/2023  PCP: [name] REFERRING PROVIDER: [name]  END OF SESSION:  OT End of Session - 01/21/23 1407     Visit Number 15    Number of Visits 24    Date for OT Re-Evaluation 04/15/23    OT Start Time 1400    OT Stop Time 1445    OT Time Calculation (min) 45 min    Activity Tolerance Patient tolerated treatment well    Behavior During Therapy [facility] for tasks assessed/performed            Past Medical History:  Diagnosis Date   Anemia    Balance problem    Diabetes mellitus without complication (HCC)    Diverticulosis    Heart murmur    Hypercholesteremia    Hypertension    Stroke [facility])    TIA 2006   TIA (transient ischemic attack) 2006   Past Surgical History:  Procedure Laterality Date   CATARACT EXTRACTION W/PHACO Left 05/22/2015   Procedure: CATARACT EXTRACTION PHACO AND INTRAOCULAR LENS PLACEMENT (IOC);  Surgeon: Galen Manila, MD;  Location: [facility] ORS;  Service: Ophthalmology;  Laterality: Left;  [place]: 01:07.9 AP%: 23.0 CDE: 15.62 Lot# [id number]   CATARACT EXTRACTION W/PHACO Right 06/12/2015   Procedure: CATARACT EXTRACTION PHACO AND INTRAOCULAR LENS PLACEMENT (IOC);  Surgeon: Galen Manila, MD;  Location: [facility] ORS;  Service: Ophthalmology;  Laterality: Right;  [place] 00:57 AP% 17.1 CDE 9.86 fluid pack lot # [id number]   COLON SURGERY     resection   COLONOSCOPY WITH PROPOFOL N/A 10/29/2021   Procedure: COLONOSCOPY WITH PROPOFOL;  Surgeon: Regis Bill, MD;  Location: [facility] ENDOSCOPY;  Service: Endoscopy;  Laterality: N/A;   ECTOPIC PREGNANCY SURGERY     HERNIA REPAIR     TONSILLECTOMY     Patient Active Problem List   Diagnosis Date Noted   Renal lesion, right 10/30/2021   Acute blood loss anemia 10/30/2021   Lower GI bleed 10/27/2021   GI bleeding 05/29/2021   CVA (cerebral vascular accident)  (HCC) 07/04/2017    ONSET DATE: 04/2018  REFERRING DIAG: CVA  THERAPY DIAG:  Muscle weakness (generalized)  Rationale for Evaluation and Treatment: Rehabilitation  SUBJECTIVE:   SUBJECTIVE STATEMENT:  Pt. reports having had transportation trouble on Monday this week.  Pt accompanied by: self  PERTINENT HISTORY: Pt. Is a 89 y.o. female who has a past medical history of CVA, hypertension, type ll DM, chronic anemia, history of diverticular bleed in February 2023. Pt. Has had a recent fall on 02/22/2022 and was found laying on the floor and had been laying there for a couple of days. Pt. Presents with generalized weakness and was referred to OT by her PCP to help with decrease in handwriting skills over the last few years.  PRECAUTIONS: None   WEIGHT BEARING RESTRICTIONS: No  PAIN:  Are you having pain? No  FALLS: Has patient fallen in last 6 months? Yes. Number of falls 1-2  LIVING ENVIRONMENT: Lives with: lives alone Lives in: Independent living facility that has apartments called "[facility]" Stairs: Engineer, structural Has following equipment at home: Dan Humphreys - 2 wheeled, Environmental consultant - 4 wheeled, Wheelchair (power), shower chair, and Grab bars  PLOF: Independent  PATIENT GOALS: Desires to improve handwriting so that she is able to write her own checks  OBJECTIVE:   HAND DOMINANCE: Left  ADLs:  Eating: Independent with feeding self Grooming:  Independent UB Dressing: Able to put blouse on, does not wear shirt with buttons LB Dressing: Able to dress self with elastic pants, able to put socks and shoes Toileting: Independent Bathing: Pt. Has someone who comes and gives her a shower Tub Shower transfers: Depends on Aid to help get in and out of the walk in shower Equipment: Shower seat with back, Grab bars, Walk in shower, and bed side commode  IADLs: Shopping: Grand daughter does the grocery shopping however Pt. Reports going with her sometimes Light housekeeping: Pt. States  she keeps a clean house and can vacuum from her wheelchair Meal Prep: Meals on wheels brings her food, grand daughter will also help prepare light meals Community mobility: Public transportation Medication management: Independent, uses pill Nature conservation officer: [place] helps pay pills and writes checks Handwriting: Print 25% legible Cursive: 25% legible Pt. Was asked to write name on paper and instead of writing side to side Pt. wrote vertically  MOBILITY STATUS: Needs Assist: Wheelchair  POSTURE COMMENTS:  No Significant postural limitations Sitting balance: WFL  ACTIVITY TOLERANCE: Activity tolerance: WFL  FUNCTIONAL OUTCOME MEASURES: FOTO: 69 TR: 68  UPPER EXTREMITY ROM:  [facility]  UPPER EXTREMITY MMT:     MMT Right eval Right 12/22/22 Right 01/21/23 Left eval Left 12/22/22 Left 01/21/23  Shoulder flexion 4+/5 4+/5 5/5 4/5 4+/5 4+/5  Shoulder abduction 4+/5 4+/5 5/5 4/5 4+/5 4+/5  Shoulder adduction        Shoulder extension        Shoulder internal rotation        Shoulder external rotation        Middle trapezius        Lower trapezius        Elbow flexion 5/5 5/5 5/5 4+/5 4+/5 5/5  Elbow extension 5/5 5/5 5/5 4+/5 4+/5 5/5  Wrist flexion 4+/5 4+/5 5/5 4/5 4+/5 4+/5  Wrist extension 4+/5 4+/5 5/5 4/5 4+/5 4+/5  Wrist ulnar deviation        Wrist radial deviation        Wrist pronation        Wrist supination        (Blank rows = not tested)  HAND FUNCTION: Grip strength: Right: 40 lbs; Left: 44 lbs, Lateral pinch: Right: 7 lbs, Left: 8 lbs, and 3 point pinch: Right: 6 lbs, Left: 7 lbs 12/22/2022: Right: 40 lbs; Left: 40 lbs, Lateral pinch: Right: 11 lbs, Left: 8 lbs, and 3 point pinch: Right: 6 lbs, Left: 5 lbs 01/21/2023: Right: 40 lbs; Left: 45 lbs, Lateral pinch: Right: 11 lbs, Left:17 lbs, and 3 point pinch: Right: 6 lbs, Left: 6 lbs  COORDINATION: 9 Hole Peg test: Right: 1 minute 10 sec; Left: 58 sec 12/22/2022: 9 Hole Peg test: Right: 1  minute & 1 sec; Left: 53 sec 01/21/2023: 9 Hole Peg test: Right: 58 sec; Left: 58 sec  SENSATION: Not tested  MUSCLE TONE: WFL  COGNITION: Overall cognitive status: Within functional limits for tasks assessed  VISION: Subjective report: Pt. Reports that she wears glasses for most of the time however takes them off sometimes. Baseline vision: Wear glasses most of the time Visual history: cataracts  PERCEPTION: WFL  PRAXIS: WFL   TODAY'S TREATMENT:  DATE: 01/21/2023   Neuromuscular re-education:  Pt. worked on left hand [facility] skills grasping 1" sticks, 1/4" collars, and 1/4" washers. Pt. worked on storing the objects in the palm, and translatory skills moving the items from the palm of the hand to the tip of the 2nd digit, and thumb. Pt. worked on removing the pegs using bilateral alternating hand patterns.   PATIENT EDUCATION: Education details: handwriting strategies, measurements, goals. Person educated: Patient Education method: Explanation, Demonstration, and Verbal cues Education comprehension: verbalized understanding, demonstrated understanding; further training needed  HOME EXERCISE PROGRAM: Handwriting practice   GOALS: Goals reviewed with patient? Yes  SHORT TERM GOALS: Target date: 01/08/2023  Pt. Will improve FOTO score by 2 points to reflect improved perceived function performance in specific ADL/IADL. Baseline: 12/22/2022: FOTO: 77 FOTO: 69 TR: 68 Goal status: MET  LONG TERM GOALS: Target date: 01/19/2023  Pt. will improve R and L hand FMC skills by 10 seconds of speed to independently manipulate small objects during ADL/IADL tasks. Baseline: 01/21/2023: Right: 58 sec. & Left 58 sec. 12/22/2022:9 Hole Peg test: Right: 1 minute & 1 sec; Left: 53 sec  Eval: 9 Hole Peg test: Right: 1 minute 10 sec; Left: 58 sec Goal status:  Progressing/Ongoing  2.  Pt. will improve L shoulder strength by 1 mm grade to be able to sustain LUE elevation while performing ADL/IADL tasks. Baseline: 01/21/2023: L: Shoulder flexion: 4+/5, Shoulder abduction: 4+/5 12/22/2022: L: Shoulder flexion: 4/5, Shoulder abduction: 4/5 Eval: L: Shoulder flexion: 4/5, Shoulder abduction: 4/5 Goal status: Progressing/Ongoing  3.  Pt. will improve L hand FMC/dexterity skills to be able to sign her name on checks with 75% legibility, using writing aids as needed. Baseline: 01/21/2023: 75% legibility, cursive 75% legibility 12/22/2022: 12/22/2023: Print: 50% legibility, cursive 50% legibility Eval: Print: 25% legibility, Cursive: 25% legibility Goal status: Achieved  4. Pt. will perform check writing tasks with 75% legibility.             Baseline: 01/21/2023: 50% legibility  Goal status New  5. Pt. will independently compose a note card correspondence with 75% legibility.  Baseline: 01/21/2023: 50% legibility  Goal status: New  ASSESSMENT:  CLINICAL IMPRESSION:  Measurements were obtained, and goals were reviewed with the Pt. Pt. is making progress with bilateral hand grip strength, pinch strength, and FMC skills. Pt. is improving with manipulating small objects for ADLs, and IADL tasks. Pt. has improved with, and met the goals for signing her name with 75% legibility. New goals have been added to the POC for writing checks, and note card correspondence with 75% legibility. Pt. continues to benefit from OT services to improve LUE strength, and [facility] skills in order to improve writing legibility to increase independence and enhance engagement in ADL/IADL tasks.   PERFORMANCE DEFICITS: in functional skills including ADLs, IADLs, coordination, strength, and Fine motor control, cognitive skills including problem solving, and psychosocial skills including coping strategies, environmental adaptation, and routines and behaviors.   IMPAIRMENTS: are limiting  patient from ADLs and IADLs.   CO-MORBIDITIES: may have co-morbidities  that affects occupational performance. Patient will benefit from skilled OT to address above impairments and improve overall function.  MODIFICATION OR ASSISTANCE TO COMPLETE EVALUATION: Min-Moderate modification of tasks or assist with assess necessary to complete an evaluation.  OT OCCUPATIONAL PROFILE AND HISTORY: Detailed assessment: Review of records and additional review of physical, cognitive, psychosocial history related to current functional performance.  CLINICAL DECISION MAKING: Moderate - several treatment options, min-mod task modification necessary  REHAB POTENTIAL: Good  EVALUATION COMPLEXITY: Moderate    PLAN:  OT FREQUENCY: 2x/week  OT DURATION: 12 weeks  PLANNED INTERVENTIONS: self care/ADL training, therapeutic exercise, therapeutic activity, neuromuscular re-education, and patient/family education  RECOMMENDED OTHER SERVICES: N/A  CONSULTED AND AGREED WITH PLAN OF CARE: Patient  PLAN FOR NEXT SESSION: Initiate treatment   Olegario Messier, MS, OTR/L  01/21/23, 2:09 PM

## 2023-01-26 ENCOUNTER — Telehealth: Payer: Self-pay | Admitting: Occupational Therapy

## 2023-01-26 ENCOUNTER — Ambulatory Visit: Payer: Medicare Other | Admitting: Occupational Therapy

## 2023-01-26 NOTE — Telephone Encounter (Signed)
Pt. Did not arrive for her scheduled appointment this afternoon. A follow-up phone call was placed with no response.

## 2023-01-28 ENCOUNTER — Ambulatory Visit: Payer: Medicare Other | Admitting: Occupational Therapy

## 2023-01-28 DIAGNOSIS — M6281 Muscle weakness (generalized): Secondary | ICD-10-CM

## 2023-01-28 DIAGNOSIS — R278 Other lack of coordination: Secondary | ICD-10-CM

## 2023-01-28 NOTE — Therapy (Signed)
OCCUPATIONAL THERAPY NEURO TREATMENT NOTE  Patient Name: Susan [name] MRN: [id number] DOB:09/29/1926, 89 y.o., female Today's Date: 01/28/2023  PCP: [name] REFERRING PROVIDER: [name]  END OF SESSION:  OT End of Session - 01/28/23 1407     Visit Number 16    Number of Visits 24    Date for OT Re-Evaluation 04/15/23    OT Start Time 1400    OT Stop Time 1445    OT Time Calculation (min) 45 min    Equipment Utilized During Treatment Wheelchair    Activity Tolerance Patient tolerated treatment well    Behavior During Therapy [facility] for tasks assessed/performed            Past Medical History:  Diagnosis Date   Anemia    Balance problem    Diabetes mellitus without complication (HCC)    Diverticulosis    Heart murmur    Hypercholesteremia    Hypertension    Stroke [facility])    TIA 2006   TIA (transient ischemic attack) 2006   Past Surgical History:  Procedure Laterality Date   CATARACT EXTRACTION W/PHACO Left 05/22/2015   Procedure: CATARACT EXTRACTION PHACO AND INTRAOCULAR LENS PLACEMENT (IOC);  Surgeon: Galen Manila, MD;  Location: [facility] ORS;  Service: Ophthalmology;  Laterality: Left;  [place]: 01:07.9 AP%: 23.0 CDE: 15.62 Lot# [id number]   CATARACT EXTRACTION W/PHACO Right 06/12/2015   Procedure: CATARACT EXTRACTION PHACO AND INTRAOCULAR LENS PLACEMENT (IOC);  Surgeon: Galen Manila, MD;  Location: [facility] ORS;  Service: Ophthalmology;  Laterality: Right;  [place] 00:57 AP% 17.1 CDE 9.86 fluid pack lot # [id number]   COLON SURGERY     resection   COLONOSCOPY WITH PROPOFOL N/A 10/29/2021   Procedure: COLONOSCOPY WITH PROPOFOL;  Surgeon: Regis Bill, MD;  Location: [facility] ENDOSCOPY;  Service: Endoscopy;  Laterality: N/A;   ECTOPIC PREGNANCY SURGERY     HERNIA REPAIR     TONSILLECTOMY     Patient Active Problem List   Diagnosis Date Noted   Renal lesion, right 10/30/2021   Acute blood loss anemia 10/30/2021   Lower GI bleed 10/27/2021   GI bleeding 05/29/2021    CVA (cerebral vascular accident) (HCC) 07/04/2017    ONSET DATE: 04/2018  REFERRING DIAG: CVA  THERAPY DIAG:  Muscle weakness (generalized)  Other lack of coordination  Rationale for Evaluation and Treatment: Rehabilitation  SUBJECTIVE:   SUBJECTIVE STATEMENT:  Pt. reports  that she has been practicing writing at home. Pt accompanied by: self  PERTINENT HISTORY: Pt. Is a 89 y.o. female who has a past medical history of CVA, hypertension, type ll DM, chronic anemia, history of diverticular bleed in February 2023. Pt. Has had a recent fall on 02/22/2022 and was found laying on the floor and had been laying there for a couple of days. Pt. Presents with generalized weakness and was referred to OT by her PCP to help with decrease in handwriting skills over the last few years.  PRECAUTIONS: None   WEIGHT BEARING RESTRICTIONS: No  PAIN:  Are you having pain? No  FALLS: Has patient fallen in last 6 months? Yes. Number of falls 1-2  LIVING ENVIRONMENT: Lives with: lives alone Lives in: Independent living facility that has apartments called "[facility]" Stairs: Engineer, structural Has following equipment at home: Dan Humphreys - 2 wheeled, Environmental consultant - 4 wheeled, Wheelchair (power), shower chair, and Grab bars  PLOF: Independent  PATIENT GOALS: Desires to improve handwriting so that she is able to write her own checks  OBJECTIVE:  HAND DOMINANCE: Left  ADLs:  Eating: Independent with feeding self Grooming: Independent UB Dressing: Able to put blouse on, does not wear shirt with buttons LB Dressing: Able to dress self with elastic pants, able to put socks and shoes Toileting: Independent Bathing: Pt. Has someone who comes and gives her a shower Tub Shower transfers: Depends on Aid to help get in and out of the walk in shower Equipment: Shower seat with back, Grab bars, Walk in shower, and bed side commode  IADLs: Shopping: Grand daughter does the grocery shopping however Pt. Reports going  with her sometimes Light housekeeping: Pt. States she keeps a clean house and can vacuum from her wheelchair Meal Prep: Meals on wheels brings her food, grand daughter will also help prepare light meals Community mobility: Public transportation Medication management: Independent, uses pill Nature conservation officer: Granddaguther helps pay pills and writes checks Handwriting: Print 25% legible Cursive: 25% legible Pt. Was asked to write name on paper and instead of writing side to side Pt. wrote vertically  MOBILITY STATUS: Needs Assist: Wheelchair  POSTURE COMMENTS:  No Significant postural limitations Sitting balance: WFL  ACTIVITY TOLERANCE: Activity tolerance: WFL  FUNCTIONAL OUTCOME MEASURES: FOTO: 69 TR: 68  UPPER EXTREMITY ROM:  [facility]  UPPER EXTREMITY MMT:     MMT Right eval Right 12/22/22 Right 01/21/23 Left eval Left 12/22/22 Left 01/21/23  Shoulder flexion 4+/5 4+/5 5/5 4/5 4+/5 4+/5  Shoulder abduction 4+/5 4+/5 5/5 4/5 4+/5 4+/5  Shoulder adduction        Shoulder extension        Shoulder internal rotation        Shoulder external rotation        Middle trapezius        Lower trapezius        Elbow flexion 5/5 5/5 5/5 4+/5 4+/5 5/5  Elbow extension 5/5 5/5 5/5 4+/5 4+/5 5/5  Wrist flexion 4+/5 4+/5 5/5 4/5 4+/5 4+/5  Wrist extension 4+/5 4+/5 5/5 4/5 4+/5 4+/5  Wrist ulnar deviation        Wrist radial deviation        Wrist pronation        Wrist supination        (Blank rows = not tested)  HAND FUNCTION: Grip strength: Right: 40 lbs; Left: 44 lbs, Lateral pinch: Right: 7 lbs, Left: 8 lbs, and 3 point pinch: Right: 6 lbs, Left: 7 lbs 12/22/2022: Right: 40 lbs; Left: 40 lbs, Lateral pinch: Right: 11 lbs, Left: 8 lbs, and 3 point pinch: Right: 6 lbs, Left: 5 lbs 01/21/2023: Right: 40 lbs; Left: 45 lbs, Lateral pinch: Right: 11 lbs, Left:17 lbs, and 3 point pinch: Right: 6 lbs, Left: 6 lbs  COORDINATION: 9 Hole Peg test: Right: 1 minute 10 sec; Left:  58 sec 12/22/2022: 9 Hole Peg test: Right: 1 minute & 1 sec; Left: 53 sec 01/21/2023: 9 Hole Peg test: Right: 58 sec; Left: 58 sec  SENSATION: Not tested  MUSCLE TONE: WFL  COGNITION: Overall cognitive status: Within functional limits for tasks assessed  VISION: Subjective report: Pt. Reports that she wears glasses for most of the time however takes them off sometimes. Baseline vision: Wear glasses most of the time Visual history: cataracts  PERCEPTION: WFL  PRAXIS: WFL   TODAY'S TREATMENT:  DATE: 01/28/2023   Neuromuscular re-education:  Pt. worked on left hand [facility] skills grasping 1" sticks, 1/4" collars, and 1/4" washers. Pt. worked on storing the objects in the palm, and translatory skills moving the items from the palm of the hand to the tip of the 2nd digit, and thumb. Pt. worked on removing the pegs using bilateral alternating hand patterns.   Therapeutic ex:  Pt. performed 1.5# dowel ex. for UE strengthening secondary to weakness. Bilateral shoulder flexion, chest press, and circular patterns were performed for 2 sets 10 reps each. 2# dumbbell ex. for elbow flexion and extension, forearm supination/pronation, wrist flexion/extension, and radial deviation for 2 sets 10 reps each. Pt. requires rest breaks and verbal cues for proper technique.   PATIENT EDUCATION: Education details: handwriting strategies, measurements, goals. Person educated: Patient Education method: Explanation, Demonstration, and Verbal cues Education comprehension: verbalized understanding, demonstrated understanding; further training needed  HOME EXERCISE PROGRAM: Handwriting practice   GOALS: Goals reviewed with patient? Yes  SHORT TERM GOALS: Target date: 01/08/2023  Pt. Will improve FOTO score by 2 points to reflect improved perceived function performance in specific  ADL/IADL. Baseline: 12/22/2022: FOTO: 77 FOTO: 69 TR: 68 Goal status: MET  LONG TERM GOALS: Target date: 01/19/2023  Pt. will improve R and L hand FMC skills by 10 seconds of speed to independently manipulate small objects during ADL/IADL tasks. Baseline: 01/21/2023: Right: 58 sec. & Left 58 sec. 12/22/2022:9 Hole Peg test: Right: 1 minute & 1 sec; Left: 53 sec  Eval: 9 Hole Peg test: Right: 1 minute 10 sec; Left: 58 sec Goal status: Progressing/Ongoing  2.  Pt. will improve L shoulder strength by 1 mm grade to be able to sustain LUE elevation while performing ADL/IADL tasks. Baseline: 01/21/2023: L: Shoulder flexion: 4+/5, Shoulder abduction: 4+/5 12/22/2022: L: Shoulder flexion: 4/5, Shoulder abduction: 4/5 Eval: L: Shoulder flexion: 4/5, Shoulder abduction: 4/5 Goal status: Progressing/Ongoing  3.  Pt. will improve L hand FMC/dexterity skills to be able to sign her name on checks with 75% legibility, using writing aids as needed. Baseline: 01/21/2023: 75% legibility, cursive 75% legibility 12/22/2022: 12/22/2023: Print: 50% legibility, cursive 50% legibility Eval: Print: 25% legibility, Cursive: 25% legibility Goal status: Achieved  4. Pt. will perform check writing tasks with 75% legibility.             Baseline: 01/21/2023: 50% legibility  Goal status New  5. Pt. will independently compose a note card correspondence with 75% legibility.  Baseline: 01/21/2023: 50% legibility  Goal status: New  ASSESSMENT:  CLINICAL IMPRESSION:  Pt. reports that she has been practicing writing, and reports that her grand daughter noticed that her handwriting is improving. Pt. responded well to the exercise, requiring cues for proper form, and hand position.  Pt. Was provided with a HEP for BUE strengthening for dowel/cane, and hand weight exercises. Pt. continues to benefit from OT services to improve LUE strength, and [facility] skills in order to improve writing legibility to increase independence and enhance  engagement in ADL/IADL tasks.   PERFORMANCE DEFICITS: in functional skills including ADLs, IADLs, coordination, strength, and Fine motor control, cognitive skills including problem solving, and psychosocial skills including coping strategies, environmental adaptation, and routines and behaviors.   IMPAIRMENTS: are limiting patient from ADLs and IADLs.   CO-MORBIDITIES: may have co-morbidities  that affects occupational performance. Patient will benefit from skilled OT to address above impairments and improve overall function.  MODIFICATION OR ASSISTANCE TO COMPLETE EVALUATION: Min-Moderate modification of tasks or assist with  assess necessary to complete an evaluation.  OT OCCUPATIONAL PROFILE AND HISTORY: Detailed assessment: Review of records and additional review of physical, cognitive, psychosocial history related to current functional performance.  CLINICAL DECISION MAKING: Moderate - several treatment options, min-mod task modification necessary  REHAB POTENTIAL: Good  EVALUATION COMPLEXITY: Moderate    PLAN:  OT FREQUENCY: 2x/week  OT DURATION: 12 weeks  PLANNED INTERVENTIONS: self care/ADL training, therapeutic exercise, therapeutic activity, neuromuscular re-education, and patient/family education  RECOMMENDED OTHER SERVICES: N/A  CONSULTED AND AGREED WITH PLAN OF CARE: Patient  PLAN FOR NEXT SESSION: Initiate treatment   Olegario Messier, MS, OTR/L  01/28/23, 2:11 PM

## 2023-02-02 ENCOUNTER — Ambulatory Visit: Payer: Medicare Other | Admitting: Occupational Therapy

## 2023-02-02 DIAGNOSIS — R278 Other lack of coordination: Secondary | ICD-10-CM

## 2023-02-02 DIAGNOSIS — M6281 Muscle weakness (generalized): Secondary | ICD-10-CM | POA: Diagnosis not present

## 2023-02-02 NOTE — Therapy (Signed)
OCCUPATIONAL THERAPY NEURO TREATMENT NOTE  Patient Name: Susan [name] MRN: [id number] DOB:Dec 02, 1926, 89 y.o., female Today's Date: 02/02/2023  PCP: [name] REFERRING PROVIDER: [name]  END OF SESSION:  OT End of Session - 02/02/23 1711     Visit Number 17    Date for OT Re-Evaluation 04/15/23    OT Start Time 1400    OT Stop Time 1445    OT Time Calculation (min) 45 min    Equipment Utilized During Treatment Wheelchair    Activity Tolerance Patient tolerated treatment well    Behavior During Therapy [facility] for tasks assessed/performed            Past Medical History:  Diagnosis Date   Anemia    Balance problem    Diabetes mellitus without complication (HCC)    Diverticulosis    Heart murmur    Hypercholesteremia    Hypertension    Stroke [facility])    TIA 2006   TIA (transient ischemic attack) 2006   Past Surgical History:  Procedure Laterality Date   CATARACT EXTRACTION W/PHACO Left 05/22/2015   Procedure: CATARACT EXTRACTION PHACO AND INTRAOCULAR LENS PLACEMENT (IOC);  Surgeon: Galen Manila, MD;  Location: [facility] ORS;  Service: Ophthalmology;  Laterality: Left;  [place]: 01:07.9 AP%: 23.0 CDE: 15.62 Lot# [id number]   CATARACT EXTRACTION W/PHACO Right 06/12/2015   Procedure: CATARACT EXTRACTION PHACO AND INTRAOCULAR LENS PLACEMENT (IOC);  Surgeon: Galen Manila, MD;  Location: [facility] ORS;  Service: Ophthalmology;  Laterality: Right;  [place] 00:57 AP% 17.1 CDE 9.86 fluid pack lot # [id number]   COLON SURGERY     resection   COLONOSCOPY WITH PROPOFOL N/A 10/29/2021   Procedure: COLONOSCOPY WITH PROPOFOL;  Surgeon: Regis Bill, MD;  Location: [facility] ENDOSCOPY;  Service: Endoscopy;  Laterality: N/A;   ECTOPIC PREGNANCY SURGERY     HERNIA REPAIR     TONSILLECTOMY     Patient Active Problem List   Diagnosis Date Noted   Renal lesion, right 10/30/2021   Acute blood loss anemia 10/30/2021   Lower GI bleed 10/27/2021   GI bleeding 05/29/2021   CVA (cerebral vascular  accident) (HCC) 07/04/2017    ONSET DATE: 04/2018  REFERRING DIAG: CVA  THERAPY DIAG:  Muscle weakness (generalized)  Other lack of coordination  Rationale for Evaluation and Treatment: Rehabilitation  SUBJECTIVE:   SUBJECTIVE STATEMENT:  Pt. reports that she continues to practice writing at home. Pt accompanied by: self  PERTINENT HISTORY: Pt. Is a 89 y.o. female who has a past medical history of CVA, hypertension, type ll DM, chronic anemia, history of diverticular bleed in February 2023. Pt. Has had a recent fall on 02/22/2022 and was found laying on the floor and had been laying there for a couple of days. Pt. Presents with generalized weakness and was referred to OT by her PCP to help with decrease in handwriting skills over the last few years.  PRECAUTIONS: None   WEIGHT BEARING RESTRICTIONS: No  PAIN:  Are you having pain? No  FALLS: Has patient fallen in last 6 months? Yes. Number of falls 1-2  LIVING ENVIRONMENT: Lives with: lives alone Lives in: Independent living facility that has apartments called "[facility]" Stairs: Engineer, structural Has following equipment at home: Dan Humphreys - 2 wheeled, Environmental consultant - 4 wheeled, Wheelchair (power), shower chair, and Grab bars  PLOF: Independent  PATIENT GOALS: Desires to improve handwriting so that she is able to write her own checks  OBJECTIVE:   HAND DOMINANCE: Left  ADLs:  Eating:  Independent with feeding self Grooming: Independent UB Dressing: Able to put blouse on, does not wear shirt with buttons LB Dressing: Able to dress self with elastic pants, able to put socks and shoes Toileting: Independent Bathing: Pt. Has someone who comes and gives her a shower Tub Shower transfers: Depends on Aid to help get in and out of the walk in shower Equipment: Shower seat with back, Grab bars, Walk in shower, and bed side commode  IADLs: Shopping: Grand daughter does the grocery shopping however Pt. Reports going with her  sometimes Light housekeeping: Pt. States she keeps a clean house and can vacuum from her wheelchair Meal Prep: Meals on wheels brings her food, grand daughter will also help prepare light meals Community mobility: Public transportation Medication management: Independent, uses pill Nature conservation officer: Grand[place]uther helps pay pills and writes checks Handwriting: Print 25% legible Cursive: 25% legible Pt. Was asked to write name on paper and instead of writing side to side Pt. wrote vertically  MOBILITY STATUS: Needs Assist: Wheelchair  POSTURE COMMENTS:  No Significant postural limitations Sitting balance: WFL  ACTIVITY TOLERANCE: Activity tolerance: WFL  FUNCTIONAL OUTCOME MEASURES: FOTO: 69 TR: 68  UPPER EXTREMITY ROM:  [facility]  UPPER EXTREMITY MMT:     MMT Right eval Right 12/22/22 Right 01/21/23 Left eval Left 12/22/22 Left 01/21/23  Shoulder flexion 4+/5 4+/5 5/5 4/5 4+/5 4+/5  Shoulder abduction 4+/5 4+/5 5/5 4/5 4+/5 4+/5  Shoulder adduction        Shoulder extension        Shoulder internal rotation        Shoulder external rotation        Middle trapezius        Lower trapezius        Elbow flexion 5/5 5/5 5/5 4+/5 4+/5 5/5  Elbow extension 5/5 5/5 5/5 4+/5 4+/5 5/5  Wrist flexion 4+/5 4+/5 5/5 4/5 4+/5 4+/5  Wrist extension 4+/5 4+/5 5/5 4/5 4+/5 4+/5  Wrist ulnar deviation        Wrist radial deviation        Wrist pronation        Wrist supination        (Blank rows = not tested)  HAND FUNCTION: Grip strength: Right: 40 lbs; Left: 44 lbs, Lateral pinch: Right: 7 lbs, Left: 8 lbs, and 3 point pinch: Right: 6 lbs, Left: 7 lbs 12/22/2022: Right: 40 lbs; Left: 40 lbs, Lateral pinch: Right: 11 lbs, Left: 8 lbs, and 3 point pinch: Right: 6 lbs, Left: 5 lbs 01/21/2023: Right: 40 lbs; Left: 45 lbs, Lateral pinch: Right: 11 lbs, Left:17 lbs, and 3 point pinch: Right: 6 lbs, Left: 6 lbs  COORDINATION: 9 Hole Peg test: Right: 1 minute 10 sec; Left: 58  sec 12/22/2022: 9 Hole Peg test: Right: 1 minute & 1 sec; Left: 53 sec 01/21/2023: 9 Hole Peg test: Right: 58 sec; Left: 58 sec  SENSATION: Not tested  MUSCLE TONE: WFL  COGNITION: Overall cognitive status: Within functional limits for tasks assessed  VISION: Subjective report: Pt. Reports that she wears glasses for most of the time however takes them off sometimes. Baseline vision: Wear glasses most of the time Visual history: cataracts  PERCEPTION: WFL  PRAXIS: WFL   TODAY'S TREATMENT:  DATE: 02/02/2023   Neuromuscular re-education:  Pt. performed [facility] tasks using the Grooved pegboard. Pt. worked on grasping the grooved pegs from a horizontal position, and moving the pegs to a vertical position in the hand to prepare for placing them in the grooved slot.    Therapeutic ex:  Pt. performed Cane exercises for BUE strengthening secondary to weakness. Bilateral shoulder flexion, chest press, and circular patterns were performed for  2 sets 10 reps each. 2# dumbbell ex. for elbow flexion and extension, forearm supination/pronation, wrist flexion/extension, and radial deviation for 1-2 sets 10 reps each. Pt. requires rest breaks and verbal cues for proper technique.   PATIENT EDUCATION: Education details: FMC, UE thereapeutic ex Person educated: Patient Education method: Explanation, Demonstration, and Verbal cues Education comprehension: verbalized understanding, demonstrated understanding; further training needed  HOME EXERCISE PROGRAM: Handwriting practice   GOALS: Goals reviewed with patient? Yes  SHORT TERM GOALS: Target date: 01/08/2023  Pt. Will improve FOTO score by 2 points to reflect improved perceived function performance in specific ADL/IADL. Baseline: 12/22/2022: FOTO: 77 FOTO: 69 TR: 68 Goal status: MET  LONG TERM GOALS: Target date:  01/19/2023  Pt. will improve R and L hand FMC skills by 10 seconds of speed to independently manipulate small objects during ADL/IADL tasks. Baseline: 01/21/2023: Right: 58 sec. & Left 58 sec. 12/22/2022:9 Hole Peg test: Right: 1 minute & 1 sec; Left: 53 sec  Eval: 9 Hole Peg test: Right: 1 minute 10 sec; Left: 58 sec Goal status: Progressing/Ongoing  2.  Pt. will improve L shoulder strength by 1 mm grade to be able to sustain LUE elevation while performing ADL/IADL tasks. Baseline: 01/21/2023: L: Shoulder flexion: 4+/5, Shoulder abduction: 4+/5 12/22/2022: L: Shoulder flexion: 4/5, Shoulder abduction: 4/5 Eval: L: Shoulder flexion: 4/5, Shoulder abduction: 4/5 Goal status: Progressing/Ongoing  3.  Pt. will improve L hand FMC/dexterity skills to be able to sign her name on checks with 75% legibility, using writing aids as needed. Baseline: 01/21/2023: 75% legibility, cursive 75% legibility 12/22/2022: 12/22/2023: Print: 50% legibility, cursive 50% legibility Eval: Print: 25% legibility, Cursive: 25% legibility Goal status: Achieved  4. Pt. will perform check writing tasks with 75% legibility.             Baseline: 01/21/2023: 50% legibility  Goal status New  5. Pt. will independently compose a note card correspondence with 75% legibility.  Baseline: 01/21/2023: 50% legibility  Goal status: New  ASSESSMENT:  CLINICAL IMPRESSION:  Pt. responded well to the exercise, requiring cues for proper pace, form, and hand position. Pt. Tends  to perform the exercises quickly. Pt. Is improving with [facility] skills, and is able to storing objects within the palm, however had difficulty with translatory movements moving them from her palm to the tip of the 2nd digit, and thumb in preparation for placing them into the grooved pegboard. Pt. drops several pegs from the tips of her fingers during tranlastory movements. Pt. continues to benefit from OT services to improve LUE strength, and [facility] skills in order to  improve writing legibility to increase independence and enhance engagement in ADL/IADL tasks.   PERFORMANCE DEFICITS: in functional skills including ADLs, IADLs, coordination, strength, and Fine motor control, cognitive skills including problem solving, and psychosocial skills including coping strategies, environmental adaptation, and routines and behaviors.   IMPAIRMENTS: are limiting patient from ADLs and IADLs.   CO-MORBIDITIES: may have co-morbidities  that affects occupational performance. Patient will benefit from skilled OT to address above impairments and improve overall function.  MODIFICATION OR ASSISTANCE TO COMPLETE EVALUATION: Min-Moderate modification of tasks or assist with assess necessary to complete an evaluation.  OT OCCUPATIONAL PROFILE AND HISTORY: Detailed assessment: Review of records and additional review of physical, cognitive, psychosocial history related to current functional performance.  CLINICAL DECISION MAKING: Moderate - several treatment options, min-mod task modification necessary  REHAB POTENTIAL: Good  EVALUATION COMPLEXITY: Moderate    PLAN:  OT FREQUENCY: 2x/week  OT DURATION: 12 weeks  PLANNED INTERVENTIONS: self care/ADL training, therapeutic exercise, therapeutic activity, neuromuscular re-education, and patient/family education  RECOMMENDED OTHER SERVICES: N/A  CONSULTED AND AGREED WITH PLAN OF CARE: Patient  PLAN FOR NEXT SESSION: Initiate treatment   Olegario Messier, MS, OTR/L  02/02/23, 5:14 PM

## 2023-02-04 ENCOUNTER — Telehealth: Payer: Self-pay | Admitting: Occupational Therapy

## 2023-02-04 ENCOUNTER — Ambulatory Visit: Payer: Medicare Other | Admitting: Occupational Therapy

## 2023-02-04 NOTE — Telephone Encounter (Signed)
Pt. did not arrive for her therapy appointment this afternoon. An attempt was made to reach out to the Pt. via telephone with no response.  Pt. reports having frequent issues with transportation services not arriving to pick her up.

## 2023-02-09 ENCOUNTER — Ambulatory Visit: Payer: Medicare Other | Admitting: Occupational Therapy

## 2023-02-10 ENCOUNTER — Ambulatory Visit: Payer: Medicare Other | Admitting: Occupational Therapy

## 2023-02-10 DIAGNOSIS — R278 Other lack of coordination: Secondary | ICD-10-CM

## 2023-02-10 DIAGNOSIS — M6281 Muscle weakness (generalized): Secondary | ICD-10-CM | POA: Diagnosis not present

## 2023-02-10 NOTE — Therapy (Addendum)
OCCUPATIONAL THERAPY NEURO TREATMENT NOTE  Patient Name: Susan [name] MRN: [id number] DOB:Apr 20, 1926, 89 y.o., female Today's Date: 02/10/2023  PCP: [name] REFERRING PROVIDER: [name]  END OF SESSION:  OT End of Session - 02/10/23 1211     Visit Number 18    Number of Visits 24    Date for OT Re-Evaluation 04/15/23    OT Start Time 1205    OT Stop Time 1245    OT Time Calculation (min) 40 min    Activity Tolerance Patient tolerated treatment well    Behavior During Therapy [facility] for tasks assessed/performed            Past Medical History:  Diagnosis Date   Anemia    Balance problem    Diabetes mellitus without complication (HCC)    Diverticulosis    Heart murmur    Hypercholesteremia    Hypertension    Stroke [facility])    TIA 2006   TIA (transient ischemic attack) 2006   Past Surgical History:  Procedure Laterality Date   CATARACT EXTRACTION W/PHACO Left 05/22/2015   Procedure: CATARACT EXTRACTION PHACO AND INTRAOCULAR LENS PLACEMENT (IOC);  Surgeon: Galen Manila, MD;  Location: [facility] ORS;  Service: Ophthalmology;  Laterality: Left;  [place]: 01:07.9 AP%: 23.0 CDE: 15.62 Lot# [id number]   CATARACT EXTRACTION W/PHACO Right 06/12/2015   Procedure: CATARACT EXTRACTION PHACO AND INTRAOCULAR LENS PLACEMENT (IOC);  Surgeon: Galen Manila, MD;  Location: [facility] ORS;  Service: Ophthalmology;  Laterality: Right;  [place] 00:57 AP% 17.1 CDE 9.86 fluid pack lot # [id number]   COLON SURGERY     resection   COLONOSCOPY WITH PROPOFOL N/A 10/29/2021   Procedure: COLONOSCOPY WITH PROPOFOL;  Surgeon: Regis Bill, MD;  Location: [facility] ENDOSCOPY;  Service: Endoscopy;  Laterality: N/A;   ECTOPIC PREGNANCY SURGERY     HERNIA REPAIR     TONSILLECTOMY     Patient Active Problem List   Diagnosis Date Noted   Renal lesion, right 10/30/2021   Acute blood loss anemia 10/30/2021   Lower GI bleed 10/27/2021   GI bleeding 05/29/2021   CVA (cerebral vascular accident) (HCC) 07/04/2017     ONSET DATE: 04/2018  REFERRING DIAG: CVA  THERAPY DIAG:  Other lack of coordination  Rationale for Evaluation and Treatment: Rehabilitation  SUBJECTIVE:   SUBJECTIVE STATEMENT:  Pt. reports that  the Zenaida Niece broke down this morning.  Pt accompanied by: self  PERTINENT HISTORY: Pt. Is a 89 y.o. female who has a past medical history of CVA, hypertension, type ll DM, chronic anemia, history of diverticular bleed in February 2023. Pt. Has had a recent fall on 02/22/2022 and was found laying on the floor and had been laying there for a couple of days. Pt. Presents with generalized weakness and was referred to OT by her PCP to help with decrease in handwriting skills over the last few years.  PRECAUTIONS: None   WEIGHT BEARING RESTRICTIONS: No  PAIN:  Are you having pain? No  FALLS: Has patient fallen in last 6 months? Yes. Number of falls 1-2  LIVING ENVIRONMENT: Lives with: lives alone Lives in: Independent living facility that has apartments called "[facility]" Stairs: Engineer, structural Has following equipment at home: Dan Humphreys - 2 wheeled, Environmental consultant - 4 wheeled, Wheelchair (power), shower chair, and Grab bars  PLOF: Independent  PATIENT GOALS: Desires to improve handwriting so that she is able to write her own checks  OBJECTIVE:   HAND DOMINANCE: Left  ADLs:  Eating: Independent with feeding self  Grooming: Independent UB Dressing: Able to put blouse on, does not wear shirt with buttons LB Dressing: Able to dress self with elastic pants, able to put socks and shoes Toileting: Independent Bathing: Pt. Has someone who comes and gives her a shower Tub Shower transfers: Depends on Aid to help get in and out of the walk in shower Equipment: Shower seat with back, Grab bars, Walk in shower, and bed side commode  IADLs: Shopping: Grand daughter does the grocery shopping however Pt. Reports going with her sometimes Light housekeeping: Pt. States she keeps a clean house and can vacuum  from her wheelchair Meal Prep: Meals on wheels brings her food, grand daughter will also help prepare light meals Community mobility: Public transportation Medication management: Independent, uses pill Nature conservation officer: Granddaguther helps pay pills and writes checks Handwriting: Print 25% legible Cursive: 25% legible Pt. Was asked to write name on paper and instead of writing side to side Pt. wrote vertically  MOBILITY STATUS: Needs Assist: Wheelchair  POSTURE COMMENTS:  No Significant postural limitations Sitting balance: WFL  ACTIVITY TOLERANCE: Activity tolerance: WFL  FUNCTIONAL OUTCOME MEASURES: FOTO: 69 TR: 68  UPPER EXTREMITY ROM:  [facility]  UPPER EXTREMITY MMT:     MMT Right eval Right 12/22/22 Right 01/21/23 Left eval Left 12/22/22 Left 01/21/23  Shoulder flexion 4+/5 4+/5 5/5 4/5 4+/5 4+/5  Shoulder abduction 4+/5 4+/5 5/5 4/5 4+/5 4+/5  Shoulder adduction        Shoulder extension        Shoulder internal rotation        Shoulder external rotation        Middle trapezius        Lower trapezius        Elbow flexion 5/5 5/5 5/5 4+/5 4+/5 5/5  Elbow extension 5/5 5/5 5/5 4+/5 4+/5 5/5  Wrist flexion 4+/5 4+/5 5/5 4/5 4+/5 4+/5  Wrist extension 4+/5 4+/5 5/5 4/5 4+/5 4+/5  Wrist ulnar deviation        Wrist radial deviation        Wrist pronation        Wrist supination        (Blank rows = not tested)  HAND FUNCTION: Grip strength: Right: 40 lbs; Left: 44 lbs, Lateral pinch: Right: 7 lbs, Left: 8 lbs, and 3 point pinch: Right: 6 lbs, Left: 7 lbs 12/22/2022: Right: 40 lbs; Left: 40 lbs, Lateral pinch: Right: 11 lbs, Left: 8 lbs, and 3 point pinch: Right: 6 lbs, Left: 5 lbs 01/21/2023: Right: 40 lbs; Left: 45 lbs, Lateral pinch: Right: 11 lbs, Left:17 lbs, and 3 point pinch: Right: 6 lbs, Left: 6 lbs  COORDINATION: 9 Hole Peg test: Right: 1 minute 10 sec; Left: 58 sec 12/22/2022: 9 Hole Peg test: Right: 1 minute & 1 sec; Left: 53 sec 01/21/2023: 9  Hole Peg test: Right: 58 sec; Left: 58 sec  SENSATION: Not tested  MUSCLE TONE: WFL  COGNITION: Overall cognitive status: Within functional limits for tasks assessed  VISION: Subjective report: Pt. Reports that she wears glasses for most of the time however takes them off sometimes. Baseline vision: Wear glasses most of the time Visual history: cataracts  PERCEPTION: WFL  PRAXIS: WFL   TODAY'S TREATMENT:  DATE: 02/10/2023   Neuromuscular re-education:  Pt. performed FMC tasks using the Grooved pegboard. Pt. worked on grasping the grooved pegs from a horizontal position, and moving the pegs to a vertical position in the hand to prepare for placing them in the grooved slot. Pt. worked on storing the pegs within her hand, and translatory movements moving them from the palm of her hand to the tip of her fingers in preparation for placing them into the pegboard. Pt. worked on [facility] skills using the [employer] Task. Pt. worked on sustaining grasp on the resistive tweezers while grasping the sticks, and moving them from a horizontal position to a vertical position to prepare for placing them into the pegboard. Pt. required verbal cues, and cues for visual demonstration for wrist position, and hand pattern when placing them into the pegboard.   PATIENT EDUCATION: Education details: FMC, UE thereapeutic ex Person educated: Patient Education method: Explanation, Demonstration, and Verbal cues Education comprehension: verbalized understanding, demonstrated understanding; further training needed  HOME EXERCISE PROGRAM: Handwriting practice   GOALS: Goals reviewed with patient? Yes  SHORT TERM GOALS: Target date: 01/08/2023  Pt. Will improve FOTO score by 2 points to reflect improved perceived function performance in specific ADL/IADL. Baseline:  12/22/2022: FOTO: 77 FOTO: 69 TR: 68 Goal status: MET  LONG TERM GOALS: Target date: 01/19/2023  Pt. will improve R and L hand FMC skills by 10 seconds of speed to independently manipulate small objects during ADL/IADL tasks. Baseline: 01/21/2023: Right: 58 sec. & Left 58 sec. 12/22/2022:9 Hole Peg test: Right: 1 minute & 1 sec; Left: 53 sec  Eval: 9 Hole Peg test: Right: 1 minute 10 sec; Left: 58 sec Goal status: Progressing/Ongoing  2.  Pt. will improve L shoulder strength by 1 mm grade to be able to sustain LUE elevation while performing ADL/IADL tasks. Baseline: 01/21/2023: L: Shoulder flexion: 4+/5, Shoulder abduction: 4+/5 12/22/2022: L: Shoulder flexion: 4/5, Shoulder abduction: 4/5 Eval: L: Shoulder flexion: 4/5, Shoulder abduction: 4/5 Goal status: Progressing/Ongoing  3.  Pt. will improve L hand FMC/dexterity skills to be able to sign her name on checks with 75% legibility, using writing aids as needed. Baseline: 01/21/2023: 75% legibility, cursive 75% legibility 12/22/2022: 12/22/2023: Print: 50% legibility, cursive 50% legibility Eval: Print: 25% legibility, Cursive: 25% legibility Goal status: Achieved  4. Pt. will perform check writing tasks with 75% legibility.             Baseline: 01/21/2023: 50% legibility  Goal status New  5. Pt. will independently compose a note card correspondence with 75% legibility.  Baseline: 01/21/2023: 50% legibility  Goal status: New  ASSESSMENT:  CLINICAL IMPRESSION:  Pt. reports that the Zenaida Niece broke down this morning, and they had to pick her up in their own personal vehicle. Pt. presented with difficulty with [facility] skills, grasping, manipulating, and storing the small objects within the palm. Pt. Continues to present with difficulty with translatory movements moving them from her palm to the tip of the 2nd digit, and thumb in preparation for placing them into the grooved pegboard. Pt. dropped multiple pegs from the tips of her fingers during  tranlastory movements. Pt. required The [employer] Tweezer task to be modified to grasping the thin pegs with her fingers to place them into the pegboard. Pt. then removed the thin pegs from the pegboard with the tweezers. Pt. continues to benefit from OT services to improve LUE strength, and [facility] skills in order to improve writing legibility to increase independence and enhance  engagement in ADL/IADL tasks.   PERFORMANCE DEFICITS: in functional skills including ADLs, IADLs, coordination, strength, and Fine motor control, cognitive skills including problem solving, and psychosocial skills including coping strategies, environmental adaptation, and routines and behaviors.   IMPAIRMENTS: are limiting patient from ADLs and IADLs.   CO-MORBIDITIES: may have co-morbidities  that affects occupational performance. Patient will benefit from skilled OT to address above impairments and improve overall function.  MODIFICATION OR ASSISTANCE TO COMPLETE EVALUATION: Min-Moderate modification of tasks or assist with assess necessary to complete an evaluation.  OT OCCUPATIONAL PROFILE AND HISTORY: Detailed assessment: Review of records and additional review of physical, cognitive, psychosocial history related to current functional performance.  CLINICAL DECISION MAKING: Moderate - several treatment options, min-mod task modification necessary  REHAB POTENTIAL: Good  EVALUATION COMPLEXITY: Moderate    PLAN:  OT FREQUENCY: 2x/week  OT DURATION: 12 weeks  PLANNED INTERVENTIONS: self care/ADL training, therapeutic exercise, therapeutic activity, neuromuscular re-education, and patient/family education  RECOMMENDED OTHER SERVICES: N/A  CONSULTED AND AGREED WITH PLAN OF CARE: Patient  PLAN FOR NEXT SESSION: Initiate treatment   Olegario Messier, MS, OTR/L  02/10/23, 12:16 PM

## 2023-02-11 ENCOUNTER — Ambulatory Visit: Payer: Medicare Other | Admitting: Occupational Therapy

## 2023-02-12 ENCOUNTER — Ambulatory Visit: Payer: Medicare Other | Admitting: Occupational Therapy

## 2023-02-12 DIAGNOSIS — M6281 Muscle weakness (generalized): Secondary | ICD-10-CM | POA: Diagnosis not present

## 2023-02-12 DIAGNOSIS — R278 Other lack of coordination: Secondary | ICD-10-CM

## 2023-02-12 NOTE — Therapy (Signed)
OCCUPATIONAL THERAPY NEURO TREATMENT NOTE  Patient Name: Susan [name] MRN: [id number] DOB:January 01, 1927, 89 y.o., female Today's Date: 02/12/2023  PCP: [name] REFERRING PROVIDER: [name]  END OF SESSION:  OT End of Session - 02/12/23 1342     Visit Number 19    Number of Visits 24    Date for OT Re-Evaluation 04/15/23    OT Start Time 1338    OT Stop Time 1400    OT Time Calculation (min) 22 min    Activity Tolerance Patient tolerated treatment well    Behavior During Therapy [facility] for tasks assessed/performed            Past Medical History:  Diagnosis Date   Anemia    Balance problem    Diabetes mellitus without complication (HCC)    Diverticulosis    Heart murmur    Hypercholesteremia    Hypertension    Stroke [facility])    TIA 2006   TIA (transient ischemic attack) 2006   Past Surgical History:  Procedure Laterality Date   CATARACT EXTRACTION W/PHACO Left 05/22/2015   Procedure: CATARACT EXTRACTION PHACO AND INTRAOCULAR LENS PLACEMENT (IOC);  Surgeon: Galen Manila, MD;  Location: [facility] ORS;  Service: Ophthalmology;  Laterality: Left;  [place]: 01:07.9 AP%: 23.0 CDE: 15.62 Lot# [id number]   CATARACT EXTRACTION W/PHACO Right 06/12/2015   Procedure: CATARACT EXTRACTION PHACO AND INTRAOCULAR LENS PLACEMENT (IOC);  Surgeon: Galen Manila, MD;  Location: [facility] ORS;  Service: Ophthalmology;  Laterality: Right;  [place] 00:57 AP% 17.1 CDE 9.86 fluid pack lot # [id number]   COLON SURGERY     resection   COLONOSCOPY WITH PROPOFOL N/A 10/29/2021   Procedure: COLONOSCOPY WITH PROPOFOL;  Surgeon: Regis Bill, MD;  Location: [facility] ENDOSCOPY;  Service: Endoscopy;  Laterality: N/A;   ECTOPIC PREGNANCY SURGERY     HERNIA REPAIR     TONSILLECTOMY     Patient Active Problem List   Diagnosis Date Noted   Renal lesion, right 10/30/2021   Acute blood loss anemia 10/30/2021   Lower GI bleed 10/27/2021   GI bleeding 05/29/2021   CVA (cerebral vascular accident) (HCC) 07/04/2017     ONSET DATE: 04/2018  REFERRING DIAG: CVA  THERAPY DIAG:  Other lack of coordination  Rationale for Evaluation and Treatment: Rehabilitation  SUBJECTIVE:   SUBJECTIVE STATEMENT:  Pt. was late to the session 2/2 transportation services arriving late to pick her up. Pt accompanied by: self  PERTINENT HISTORY: Pt. Is a 89 y.o. female who has a past medical history of CVA, hypertension, type ll DM, chronic anemia, history of diverticular bleed in February 2023. Pt. Has had a recent fall on 02/22/2022 and was found laying on the floor and had been laying there for a couple of days. Pt. Presents with generalized weakness and was referred to OT by her PCP to help with decrease in handwriting skills over the last few years.  PRECAUTIONS: None   WEIGHT BEARING RESTRICTIONS: No  PAIN:  Are you having pain? No  FALLS: Has patient fallen in last 6 months? Yes. Number of falls 1-2  LIVING ENVIRONMENT: Lives with: lives alone Lives in: Independent living facility that has apartments called "[facility]" Stairs: Engineer, structural Has following equipment at home: Dan Humphreys - 2 wheeled, Environmental consultant - 4 wheeled, Wheelchair (power), shower chair, and Grab bars  PLOF: Independent  PATIENT GOALS: Desires to improve handwriting so that she is able to write her own checks  OBJECTIVE:   HAND DOMINANCE: Left  ADLs:  Eating:  Independent with feeding self Grooming: Independent UB Dressing: Able to put blouse on, does not wear shirt with buttons LB Dressing: Able to dress self with elastic pants, able to put socks and shoes Toileting: Independent Bathing: Pt. Has someone who comes and gives her a shower Tub Shower transfers: Depends on Aid to help get in and out of the walk in shower Equipment: Shower seat with back, Grab bars, Walk in shower, and bed side commode  IADLs: Shopping: Grand daughter does the grocery shopping however Pt. Reports going with her sometimes Light housekeeping: Pt. States she  keeps a clean house and can vacuum from her wheelchair Meal Prep: Meals on wheels brings her food, grand daughter will also help prepare light meals Community mobility: Public transportation Medication management: Independent, uses pill Nature conservation officer: Granddaguther helps pay pills and writes checks Handwriting: Print 25% legible Cursive: 25% legible Pt. Was asked to write name on paper and instead of writing side to side Pt. wrote vertically  MOBILITY STATUS: Needs Assist: Wheelchair  POSTURE COMMENTS:  No Significant postural limitations Sitting balance: WFL  ACTIVITY TOLERANCE: Activity tolerance: WFL  FUNCTIONAL OUTCOME MEASURES: FOTO: 69 TR: 68  UPPER EXTREMITY ROM:  [facility]  UPPER EXTREMITY MMT:     MMT Right eval Right 12/22/22 Right 01/21/23 Left eval Left 12/22/22 Left 01/21/23  Shoulder flexion 4+/5 4+/5 5/5 4/5 4+/5 4+/5  Shoulder abduction 4+/5 4+/5 5/5 4/5 4+/5 4+/5  Shoulder adduction        Shoulder extension        Shoulder internal rotation        Shoulder external rotation        Middle trapezius        Lower trapezius        Elbow flexion 5/5 5/5 5/5 4+/5 4+/5 5/5  Elbow extension 5/5 5/5 5/5 4+/5 4+/5 5/5  Wrist flexion 4+/5 4+/5 5/5 4/5 4+/5 4+/5  Wrist extension 4+/5 4+/5 5/5 4/5 4+/5 4+/5  Wrist ulnar deviation        Wrist radial deviation        Wrist pronation        Wrist supination        (Blank rows = not tested)  HAND FUNCTION: Grip strength: Right: 40 lbs; Left: 44 lbs, Lateral pinch: Right: 7 lbs, Left: 8 lbs, and 3 point pinch: Right: 6 lbs, Left: 7 lbs 12/22/2022: Right: 40 lbs; Left: 40 lbs, Lateral pinch: Right: 11 lbs, Left: 8 lbs, and 3 point pinch: Right: 6 lbs, Left: 5 lbs 01/21/2023: Right: 40 lbs; Left: 45 lbs, Lateral pinch: Right: 11 lbs, Left:17 lbs, and 3 point pinch: Right: 6 lbs, Left: 6 lbs  COORDINATION: 9 Hole Peg test: Right: 1 minute 10 sec; Left: 58 sec 12/22/2022: 9 Hole Peg test: Right: 1 minute &  1 sec; Left: 53 sec 01/21/2023: 9 Hole Peg test: Right: 58 sec; Left: 58 sec  SENSATION: Not tested  MUSCLE TONE: WFL  COGNITION: Overall cognitive status: Within functional limits for tasks assessed  VISION: Subjective report: Pt. Reports that she wears glasses for most of the time however takes them off sometimes. Baseline vision: Wear glasses most of the time Visual history: cataracts  PERCEPTION: WFL  PRAXIS: WFL   TODAY'S TREATMENT:  DATE: 02/12/2023   Neuromuscular re-education:  Pt. performed FMC tasks using the Grooved pegboard. Pt. worked on grasping the grooved pegs from a horizontal position, and moving the pegs to a vertical position in the hand to prepare for placing them in the grooved slot. Pt. worked on manipulating the grooved pegs, and turning them within the tip of her fingers in preparation for placing them into the pegboard. Pt. worked on removing them from the pegboard alternating thumb opposition tot he tip of the 2nd through 5th digits. Pt. Worked on storing the grooved pegs in the palm of her hand, and controlled dropping them one at a time from the ulnar aspect of her left palm.    PATIENT EDUCATION: Education details: FMC, UE thereapeutic ex Person educated: Patient Education method: Explanation, Demonstration, and Verbal cues Education comprehension: verbalized understanding, demonstrated understanding; further training needed  HOME EXERCISE PROGRAM: Handwriting practice   GOALS: Goals reviewed with patient? Yes  SHORT TERM GOALS: Target date: 01/08/2023  Pt. Will improve FOTO score by 2 points to reflect improved perceived function performance in specific ADL/IADL. Baseline: 12/22/2022: FOTO: 77 FOTO: 69 TR: 68 Goal status: MET  LONG TERM GOALS: Target date: 01/19/2023  Pt. will improve R and L hand FMC skills by 10  seconds of speed to independently manipulate small objects during ADL/IADL tasks. Baseline: 01/21/2023: Right: 58 sec. & Left 58 sec. 12/22/2022:9 Hole Peg test: Right: 1 minute & 1 sec; Left: 53 sec  Eval: 9 Hole Peg test: Right: 1 minute 10 sec; Left: 58 sec Goal status: Progressing/Ongoing  2.  Pt. will improve L shoulder strength by 1 mm grade to be able to sustain LUE elevation while performing ADL/IADL tasks. Baseline: 01/21/2023: L: Shoulder flexion: 4+/5, Shoulder abduction: 4+/5 12/22/2022: L: Shoulder flexion: 4/5, Shoulder abduction: 4/5 Eval: L: Shoulder flexion: 4/5, Shoulder abduction: 4/5 Goal status: Progressing/Ongoing  3.  Pt. will improve L hand FMC/dexterity skills to be able to sign her name on checks with 75% legibility, using writing aids as needed. Baseline: 01/21/2023: 75% legibility, cursive 75% legibility 12/22/2022: 12/22/2023: Print: 50% legibility, cursive 50% legibility Eval: Print: 25% legibility, Cursive: 25% legibility Goal status: Achieved  4. Pt. will perform check writing tasks with 75% legibility.             Baseline: 01/21/2023: 50% legibility  Goal status New  5. Pt. will independently compose a note card correspondence with 75% legibility.  Baseline: 01/21/2023: 50% legibility  Goal status: New  ASSESSMENT:  CLINICAL IMPRESSION:  Pt. reports that transportation services arrived late to pick her up today. Pt. presented with difficulty with [facility] skills, grasping, manipulating, and storing the small objects within the palm. Pt. Is improving with twisting the grooved pegs within her finger tips, however continues to present with difficulty with translatory movements moving them from her palm to the tip of the 2nd digit, and thumb in preparation for placing them into the grooved pegboard. Pt. Was able to grasp, and store the grooved pegs, then drop them one at a time from the ulnar aspect of her palm. Pt. continues to benefit from OT services to improve LUE  strength, and [facility] skills in order to improve writing legibility to increase independence and enhance engagement in ADL/IADL tasks.   PERFORMANCE DEFICITS: in functional skills including ADLs, IADLs, coordination, strength, and Fine motor control, cognitive skills including problem solving, and psychosocial skills including coping strategies, environmental adaptation, and routines and behaviors.   IMPAIRMENTS: are limiting patient from ADLs  and IADLs.   CO-MORBIDITIES: may have co-morbidities  that affects occupational performance. Patient will benefit from skilled OT to address above impairments and improve overall function.  MODIFICATION OR ASSISTANCE TO COMPLETE EVALUATION: Min-Moderate modification of tasks or assist with assess necessary to complete an evaluation.  OT OCCUPATIONAL PROFILE AND HISTORY: Detailed assessment: Review of records and additional review of physical, cognitive, psychosocial history related to current functional performance.  CLINICAL DECISION MAKING: Moderate - several treatment options, min-mod task modification necessary  REHAB POTENTIAL: Good  EVALUATION COMPLEXITY: Moderate    PLAN:  OT FREQUENCY: 2x/week  OT DURATION: 12 weeks  PLANNED INTERVENTIONS: self care/ADL training, therapeutic exercise, therapeutic activity, neuromuscular re-education, and patient/family education  RECOMMENDED OTHER SERVICES: N/A  CONSULTED AND AGREED WITH PLAN OF CARE: Patient  PLAN FOR NEXT SESSION: Initiate treatment   Olegario Messier, MS, OTR/L  02/12/23, 1:45 PM

## 2023-02-16 ENCOUNTER — Ambulatory Visit: Payer: Medicare Other | Attending: Family Medicine | Admitting: Occupational Therapy

## 2023-02-16 ENCOUNTER — Telehealth: Payer: Self-pay | Admitting: Occupational Therapy

## 2023-02-16 DIAGNOSIS — R278 Other lack of coordination: Secondary | ICD-10-CM | POA: Insufficient documentation

## 2023-02-16 DIAGNOSIS — M6281 Muscle weakness (generalized): Secondary | ICD-10-CM | POA: Insufficient documentation

## 2023-02-16 NOTE — Telephone Encounter (Signed)
Pt. did not arrive for her therapy appointment this afternoon, after speaking with the Rehabilitation Services receptionist about transportation services this morning. An attempt was made to reach out to the Pt. this afternoon without a response.

## 2023-02-18 ENCOUNTER — Ambulatory Visit: Payer: Medicare Other | Admitting: Occupational Therapy

## 2023-02-18 DIAGNOSIS — M6281 Muscle weakness (generalized): Secondary | ICD-10-CM | POA: Diagnosis present

## 2023-02-18 DIAGNOSIS — R278 Other lack of coordination: Secondary | ICD-10-CM

## 2023-02-18 NOTE — Therapy (Addendum)
Occupational Therapy Progress Note  Dates of reporting period  12/22/2022   to   02/18/2023  Patient Name: Susan [name] MRN: [id number] DOB:01-Nov-1926, 89 y.o., female Today's Date: 02/18/2023  PCP: [name] REFERRING PROVIDER: [name]  END OF SESSION:  OT End of Session - 02/18/23 1408     Visit Number 20    Number of Visits 48    Date for OT Re-Evaluation 04/15/23    OT Start Time 1403    OT Stop Time 1445    OT Time Calculation (min) 42 min    Equipment Utilized During Treatment Wheelchair    Activity Tolerance Patient tolerated treatment well    Behavior During Therapy [facility] for tasks assessed/performed            Past Medical History:  Diagnosis Date   Anemia    Balance problem    Diabetes mellitus without complication (HCC)    Diverticulosis    Heart murmur    Hypercholesteremia    Hypertension    Stroke [facility])    TIA 2006   TIA (transient ischemic attack) 2006   Past Surgical History:  Procedure Laterality Date   CATARACT EXTRACTION W/PHACO Left 05/22/2015   Procedure: CATARACT EXTRACTION PHACO AND INTRAOCULAR LENS PLACEMENT (IOC);  Surgeon: Galen Manila, MD;  Location: [facility] ORS;  Service: Ophthalmology;  Laterality: Left;  [place]: 01:07.9 AP%: 23.0 CDE: 15.62 Lot# [id number]   CATARACT EXTRACTION W/PHACO Right 06/12/2015   Procedure: CATARACT EXTRACTION PHACO AND INTRAOCULAR LENS PLACEMENT (IOC);  Surgeon: Galen Manila, MD;  Location: [facility] ORS;  Service: Ophthalmology;  Laterality: Right;  [place] 00:57 AP% 17.1 CDE 9.86 fluid pack lot # [id number]   COLON SURGERY     resection   COLONOSCOPY WITH PROPOFOL N/A 10/29/2021   Procedure: COLONOSCOPY WITH PROPOFOL;  Surgeon: Regis Bill, MD;  Location: [facility] ENDOSCOPY;  Service: Endoscopy;  Laterality: N/A;   ECTOPIC PREGNANCY SURGERY     HERNIA REPAIR     TONSILLECTOMY     Patient Active Problem List   Diagnosis Date Noted   Renal lesion, right 10/30/2021   Acute blood loss anemia 10/30/2021    Lower GI bleed 10/27/2021   GI bleeding 05/29/2021   CVA (cerebral vascular accident) (HCC) 07/04/2017    ONSET DATE: 04/2018  REFERRING DIAG: CVA  THERAPY DIAG:  Muscle weakness (generalized)  Other lack of coordination  Rationale for Evaluation and Treatment: Rehabilitation  SUBJECTIVE:   SUBJECTIVE STATEMENT:  Pt. was late to the session 2/2 transportation services arriving late to pick her up. Pt accompanied by: self  PERTINENT HISTORY: Pt. is a 89 y.o. female who has a past medical history of CVA, hypertension, type ll DM, chronic anemia, history of diverticular bleed in February 2023. Pt. Has had a recent fall on 02/22/2022 and was found laying on the floor and had been laying there for a couple of days. Pt. Presents with generalized weakness and was referred to OT by her PCP to help with decrease in handwriting skills over the last few years.  PRECAUTIONS: None   WEIGHT BEARING RESTRICTIONS: No  PAIN:  Are you having pain? No  FALLS: Has patient fallen in last 6 months? Yes. Number of falls 1-2  LIVING ENVIRONMENT: Lives with: lives alone Lives in: Independent living facility that has apartments called "[facility]" Stairs: Engineer, structural Has following equipment at home: Dan Humphreys - 2 wheeled, Environmental consultant - 4 wheeled, Wheelchair (power), shower chair, and Grab bars  PLOF: Independent  PATIENT GOALS: Desires  to improve handwriting so that she is able to write her own checks  OBJECTIVE:   HAND DOMINANCE: Left  ADLs:  Eating: Independent with feeding self Grooming: Independent UB Dressing: Able to put blouse on, does not wear shirt with buttons LB Dressing: Able to dress self with elastic pants, able to put socks and shoes Toileting: Independent Bathing: Pt. Has someone who comes and gives her a shower Tub Shower transfers: Depends on Aid to help get in and out of the walk in shower Equipment: Shower seat with back, Grab bars, Walk in shower, and bed side  commode  IADLs: Shopping: Grand daughter does the grocery shopping however Pt. Reports going with her sometimes Light housekeeping: Pt. States she keeps a clean house and can vacuum from her wheelchair Meal Prep: Meals on wheels brings her food, grand daughter will also help prepare light meals Community mobility: Public transportation Medication management: Independent, uses pill Nature conservation officer: Granddaguther helps pay pills and writes checks Handwriting: Print 25% legible Cursive: 25% legible Pt. Was asked to write name on paper and instead of writing side to side Pt. wrote vertically  MOBILITY STATUS: Needs Assist: Wheelchair  POSTURE COMMENTS:  No Significant postural limitations Sitting balance: WFL  ACTIVITY TOLERANCE: Activity tolerance: WFL  FUNCTIONAL OUTCOME MEASURES: FOTO: 69 TR: 68  UPPER EXTREMITY ROM:  [facility]  UPPER EXTREMITY MMT:     MMT Right eval Right 12/22/22 Right 01/21/23 Left eval Left 12/22/22 Left 01/21/23 Left 02/18/23  Shoulder flexion 4+/5 4+/5 5/5 4/5 4+/5 4+/5 5/5  Shoulder abduction 4+/5 4+/5 5/5 4/5 4+/5 4+/5 5/5  Shoulder adduction         Shoulder extension         Shoulder internal rotation         Shoulder external rotation         Middle trapezius         Lower trapezius         Elbow flexion 5/5 5/5 5/5 4+/5 4+/5 5/5 5/5  Elbow extension 5/5 5/5 5/5 4+/5 4+/5 5/5 5/5  Wrist flexion 4+/5 4+/5 5/5 4/5 4+/5 4+/5 5/5  Wrist extension 4+/5 4+/5 5/5 4/5 4+/5 4+/5 5/5  Wrist ulnar deviation         Wrist radial deviation         Wrist pronation         Wrist supination         (Blank rows = not tested)  HAND FUNCTION: Grip strength: Right: 40 lbs; Left: 44 lbs, Lateral pinch: Right: 7 lbs, Left: 8 lbs, and 3 point pinch: Right: 6 lbs, Left: 7 lbs 12/22/2022: Right: 40 lbs; Left: 40 lbs, Lateral pinch: Right: 11 lbs, Left: 8 lbs, and 3 point pinch: Right: 6 lbs, Left: 5 lbs 01/21/2023: Right: 40 lbs; Left: 45 lbs, Lateral  pinch: Right: 11 lbs, Left:17 lbs, and 3 point pinch: Right: 6 lbs, Left: 6 lbs 02/18/2023 Right: 37 lbs; Left: 45 lbs, Lateral pinch: Right: 7 lbs, Left: 20 lbs, and 3 point pinch: Right:10 lbs, Left: 6 lbs   COORDINATION: 9 Hole Peg test: Right: 1 minute 10 sec; Left: 58 sec 12/22/2022: 9 Hole Peg test: Right: 1 minute & 1 sec; Left: 53 sec 01/21/2023: 9 Hole Peg test: Right: 58 sec; Left: 58 sec 02/18/2023: 9 Hole Peg test: Right: 1 min. & 14 sec.; Left: 1 min& 18 sec.   SENSATION: Not tested  MUSCLE TONE: WFL  COGNITION: Overall cognitive status: Within functional limits  for tasks assessed  VISION: Subjective report: Pt. reports that she wears glasses for most of the time however takes them off sometimes. Baseline vision: Wear glasses most of the time Visual history: cataracts  PERCEPTION: WFL  PRAXIS: WFL   TODAY'S TREATMENT:                                                                                                                              DATE: 02/18/2023   Measurements were obtained, and goals were reviewed with the Pt.   PATIENT EDUCATION: Education details: FMC, UE thereapeutic ex Person educated: Patient Education method: Explanation, Demonstration, and Verbal cues Education comprehension: verbalized understanding, demonstrated understanding; further training needed  HOME EXERCISE PROGRAM: Handwriting practice   GOALS: Goals reviewed with patient? Yes  SHORT TERM GOALS: Target date: 01/08/2023  Pt. Will improve FOTO score by 2 points to reflect improved perceived function performance in specific ADL/IADL. Baseline: 02/18/2023: 61 12/22/2022: FOTO: 77 Eval: FOTO: 69 TR: 68 Goal status: MET  LONG TERM GOALS: Target date: 04/15/2023  Pt. will improve R and L hand FMC skills by 10 seconds of speed to independently manipulate small objects during ADL/IADL tasks. Baseline: 02/18/2023:  9 Hole Peg test: Right: 1 min & 14sec; Left: 1 min& 18 sec.  01/21/2023: Right: 58 sec. & Left 58 sec. 12/22/2022:9 Hole Peg test: Right: 1 minute & 1 sec; Left: 53 sec. Eval: 9 Hole Peg test: Right: 1 minute 10 sec; Left: 58 sec Goal status: Ongoing  2.  Pt. will improve L shoulder strength by 1 mm grade to be able to sustain LUE elevation while performing ADL/IADL tasks. Baseline: 02/18/2023:  L: Shoulder flexion: 5/5, Shoulder abduction: 5/5 12/22/2022: L: Shoulder flexion: 4/5, Shoulder abduction: 4/5 Eval: L: Shoulder flexion: 4/5, Shoulder abduction: 4/5 01/21/2023: L: Shoulder flexion: 4+/5, Shoulder abduction: 4+/5 12/22/2022: L: Shoulder flexion: 4/5, Shoulder abduction: 4/5 Eval: L: Shoulder flexion: 4/5, Shoulder abduction: 4/5 Goal status: Achieved  3.  Pt. will improve L hand FMC/dexterity skills to be able to sign her name on checks with 75% legibility, using writing aids as needed. Baseline: 01/21/2023: 75% legibility, cursive 75% legibility 12/22/2022: 12/22/2023: Print: 50% legibility, cursive 50% legibility Eval: Print: 25% legibility, Cursive: 25% legibility Goal status: Achieved  4. Pt. will perform check writing tasks with 75% legibility.             Baseline: 02/18/2023: 50% legibility 01/21/2023: 50% legibility  Goal status: Ongoing  5. Pt. will independently compose a note card correspondence with 75% legibility.  Baseline: 02/18/2023: 50% 01/21/2023: 50% legibility  Goal status: Ongoing ASSESSMENT:  CLINICAL IMPRESSION:  Measurements were obtained, and goals were reviewed with the Pt. Pt. has made progress with bilateral UE strength, grip strength, pinch strength, and handwriting skills signing her name legibly, and formulating written correspondence. Pt. continues to present with limited bilateral [facility] skills which hinders engagement in ADL tasks, IADL tasks, and overall function. Pt. continues to benefit from  OT services to improve LUE strength, and [facility] skills in order to improve writing legibility to increase independence and  enhance engagement in ADL/IADL tasks.   PERFORMANCE DEFICITS: in functional skills including ADLs, IADLs, coordination, strength, and Fine motor control, cognitive skills including problem solving, and psychosocial skills including coping strategies, environmental adaptation, and routines and behaviors.   IMPAIRMENTS: are limiting patient from ADLs and IADLs.   CO-MORBIDITIES: may have co-morbidities  that affects occupational performance. Patient will benefit from skilled OT to address above impairments and improve overall function.  MODIFICATION OR ASSISTANCE TO COMPLETE EVALUATION: Min-Moderate modification of tasks or assist with assess necessary to complete an evaluation.  OT OCCUPATIONAL PROFILE AND HISTORY: Detailed assessment: Review of records and additional review of physical, cognitive, psychosocial history related to current functional performance.  CLINICAL DECISION MAKING: Moderate - several treatment options, min-mod task modification necessary  REHAB POTENTIAL: Good  EVALUATION COMPLEXITY: Moderate    PLAN:  OT FREQUENCY: 2x/week  OT DURATION: 12 weeks  PLANNED INTERVENTIONS: self care/ADL training, therapeutic exercise, therapeutic activity, neuromuscular re-education, and patient/family education  RECOMMENDED OTHER SERVICES: N/A  CONSULTED AND AGREED WITH PLAN OF CARE: Patient  PLAN FOR NEXT SESSION: Initiate treatment  Olegario Messier, MS, OTR/L  02/18/23, 2:15 PM

## 2023-02-23 ENCOUNTER — Ambulatory Visit: Payer: Medicare Other | Admitting: Occupational Therapy

## 2023-02-23 DIAGNOSIS — R278 Other lack of coordination: Secondary | ICD-10-CM

## 2023-02-23 DIAGNOSIS — M6281 Muscle weakness (generalized): Secondary | ICD-10-CM | POA: Diagnosis not present

## 2023-02-23 NOTE — Therapy (Signed)
Occupational Therapy  Neuro Treatment Note  Patient Name: Susan [name] MRN: [id number] DOB:[birth year], 89 y.o., female Today's Date: 02/23/2023  PCP: [name] REFERRING PROVIDER: [name]  END OF SESSION:  OT End of Session - 02/23/23 1417     Visit Number 21    Number of Visits 48    Date for OT Re-Evaluation 04/15/23    OT Start Time 1400    OT Stop Time 1445    OT Time Calculation (min) 45 min    Activity Tolerance Patient tolerated treatment well    Behavior During Therapy [facility] for tasks assessed/performed            Past Medical History:  Diagnosis Date   Anemia    Balance problem    Diabetes mellitus without complication (HCC)    Diverticulosis    Heart murmur    Hypercholesteremia    Hypertension    Stroke [facility])    TIA 2006   TIA (transient ischemic attack) 2006   Past Surgical History:  Procedure Laterality Date   CATARACT EXTRACTION W/PHACO Left 05/22/2015   Procedure: CATARACT EXTRACTION PHACO AND INTRAOCULAR LENS PLACEMENT (IOC);  Surgeon: Galen Manila, MD;  Location: [facility] ORS;  Service: Ophthalmology;  Laterality: Left;  [place]: 01:07.9 AP%: 23.0 CDE: 15.62 Lot# [id number]   CATARACT EXTRACTION W/PHACO Right 06/12/2015   Procedure: CATARACT EXTRACTION PHACO AND INTRAOCULAR LENS PLACEMENT (IOC);  Surgeon: Galen Manila, MD;  Location: [facility] ORS;  Service: Ophthalmology;  Laterality: Right;  [place] 00:57 AP% 17.1 CDE 9.86 fluid pack lot # [id number]   COLON SURGERY     resection   COLONOSCOPY WITH PROPOFOL N/A 10/29/2021   Procedure: COLONOSCOPY WITH PROPOFOL;  Surgeon: Regis Bill, MD;  Location: [facility] ENDOSCOPY;  Service: Endoscopy;  Laterality: N/A;   ECTOPIC PREGNANCY SURGERY     HERNIA REPAIR     TONSILLECTOMY     Patient Active Problem List   Diagnosis Date Noted   Renal lesion, right 10/30/2021   Acute blood loss anemia 10/30/2021   Lower GI bleed 10/27/2021   GI bleeding 05/29/2021   CVA (cerebral vascular accident) (HCC) 07/04/2017     ONSET DATE: 04/2018  REFERRING DIAG: CVA  THERAPY DIAG:  Muscle weakness (generalized)  Other lack of coordination  Rationale for Evaluation and Treatment: Rehabilitation  SUBJECTIVE:   SUBJECTIVE STATEMENT:  Pt. Reports doing well today. Pt accompanied by: self  PERTINENT HISTORY: Pt. is a 89 y.o. female who has a past medical history of CVA, hypertension, type ll DM, chronic anemia, history of diverticular bleed in February 2023. Pt. Has had a recent fall on 02/22/2022 and was found laying on the floor and had been laying there for a couple of days. Pt. Presents with generalized weakness and was referred to OT by her PCP to help with decrease in handwriting skills over the last few years.  PRECAUTIONS: None   WEIGHT BEARING RESTRICTIONS: No  PAIN:  Are you having pain? No  FALLS: Has patient fallen in last 6 months? Yes. Number of falls 1-2  LIVING ENVIRONMENT: Lives with: lives alone Lives in: Independent living facility that has apartments called "[facility]" Stairs: Engineer, structural Has following equipment at home: Dan Humphreys - 2 wheeled, Environmental consultant - 4 wheeled, Wheelchair (power), shower chair, and Grab bars  PLOF: Independent  PATIENT GOALS: Desires to improve handwriting so that she is able to write her own checks  OBJECTIVE:   HAND DOMINANCE: Left  ADLs:  Eating: Independent with feeding self Grooming:  Independent UB Dressing: Able to put blouse on, does not wear shirt with buttons LB Dressing: Able to dress self with elastic pants, able to put socks and shoes Toileting: Independent Bathing: Pt. Has someone who comes and gives her a shower Tub Shower transfers: Depends on Aid to help get in and out of the walk in shower Equipment: Shower seat with back, Grab bars, Walk in shower, and bed side commode  IADLs: Shopping: Grand daughter does the grocery shopping however Pt. Reports going with her sometimes Light housekeeping: Pt. States she keeps a clean house and  can vacuum from her wheelchair Meal Prep: Meals on wheels brings her food, grand daughter will also help prepare light meals Community mobility: Public transportation Medication management: Independent, uses pill Nature conservation officer: Granddaguther helps pay pills and writes checks Handwriting: Print 25% legible Cursive: 25% legible Pt. Was asked to write name on paper and instead of writing side to side Pt. wrote vertically  MOBILITY STATUS: Needs Assist: Wheelchair  POSTURE COMMENTS:  No Significant postural limitations Sitting balance: WFL  ACTIVITY TOLERANCE: Activity tolerance: WFL  FUNCTIONAL OUTCOME MEASURES: FOTO: 69 TR: 68  UPPER EXTREMITY ROM:  [facility]  UPPER EXTREMITY MMT:     MMT Right eval Right 12/22/22 Right 01/21/23 Left eval Left 12/22/22 Left 01/21/23 Left 02/18/23  Shoulder flexion 4+/5 4+/5 5/5 4/5 4+/5 4+/5 5/5  Shoulder abduction 4+/5 4+/5 5/5 4/5 4+/5 4+/5 5/5  Shoulder adduction         Shoulder extension         Shoulder internal rotation         Shoulder external rotation         Middle trapezius         Lower trapezius         Elbow flexion 5/5 5/5 5/5 4+/5 4+/5 5/5 5/5  Elbow extension 5/5 5/5 5/5 4+/5 4+/5 5/5 5/5  Wrist flexion 4+/5 4+/5 5/5 4/5 4+/5 4+/5 5/5  Wrist extension 4+/5 4+/5 5/5 4/5 4+/5 4+/5 5/5  Wrist ulnar deviation         Wrist radial deviation         Wrist pronation         Wrist supination         (Blank rows = not tested)  HAND FUNCTION: Grip strength: Right: 40 lbs; Left: 44 lbs, Lateral pinch: Right: 7 lbs, Left: 8 lbs, and 3 point pinch: Right: 6 lbs, Left: 7 lbs 12/22/2022: Right: 40 lbs; Left: 40 lbs, Lateral pinch: Right: 11 lbs, Left: 8 lbs, and 3 point pinch: Right: 6 lbs, Left: 5 lbs 01/21/2023: Right: 40 lbs; Left: 45 lbs, Lateral pinch: Right: 11 lbs, Left:17 lbs, and 3 point pinch: Right: 6 lbs, Left: 6 lbs 02/18/2023 Right: 37 lbs; Left: 45 lbs, Lateral pinch: Right: 7 lbs, Left: 20 lbs, and 3  point pinch: Right:10 lbs, Left: 6 lbs   COORDINATION: 9 Hole Peg test: Right: 1 minute 10 sec; Left: 58 sec 12/22/2022: 9 Hole Peg test: Right: 1 minute & 1 sec; Left: 53 sec 01/21/2023: 9 Hole Peg test: Right: 58 sec; Left: 58 sec 02/18/2023: 9 Hole Peg test: Right: 1 min. & 14 sec.; Left: 1 min& 18 sec.   SENSATION: Not tested  MUSCLE TONE: WFL  COGNITION: Overall cognitive status: Within functional limits for tasks assessed  VISION: Subjective report: Pt. reports that she wears glasses for most of the time however takes them off sometimes. Baseline vision: Wear glasses most of  the time Visual history: cataracts  PERCEPTION: WFL  PRAXIS: WFL   TODAY'S TREATMENT:                                                                                                                              DATE: 02/23/2023   Self-care:  Pt. worked on Chiropractor , and Nurse, adult, and lists of words.   Neuro-muscular re-education:  Pt. performed [facility] tasks using the Grooved pegboard. Pt. worked on grasping the grooved pegs from a horizontal position, and moving the pegs to a vertical position in the hand to prepare for placing them in the grooved slot. Pt. worked on removing the grooved pegs by grasping, and storing them in the palm of her hand.    PATIENT EDUCATION: Education details: FMC, UE thereapeutic ex Person educated: Patient Education method: Explanation, Demonstration, and Verbal cues Education comprehension: verbalized understanding, demonstrated understanding; further training needed  HOME EXERCISE PROGRAM: Handwriting practice   GOALS: Goals reviewed with patient? Yes  SHORT TERM GOALS: Target date: 01/08/2023  Pt. Will improve FOTO score by 2 points to reflect improved perceived function performance in specific ADL/IADL. Baseline: 02/18/2023: 61 12/22/2022: FOTO: 77 Eval: FOTO: 69 TR: 68 Goal status: MET  LONG TERM GOALS:  Target date: 04/15/2023  Pt. will improve R and L hand FMC skills by 10 seconds of speed to independently manipulate small objects during ADL/IADL tasks. Baseline: 02/18/2023:  9 Hole Peg test: Right: 1 min & 14sec; Left: 1 min& 18 sec. 01/21/2023: Right: 58 sec. & Left 58 sec. 12/22/2022:9 Hole Peg test: Right: 1 minute & 1 sec; Left: 53 sec. Eval: 9 Hole Peg test: Right: 1 minute 10 sec; Left: 58 sec Goal status: Ongoing  2.  Pt. will improve L shoulder strength by 1 mm grade to be able to sustain LUE elevation while performing ADL/IADL tasks. Baseline: 02/18/2023:  L: Shoulder flexion: 5/5, Shoulder abduction: 5/5 12/22/2022: L: Shoulder flexion: 4/5, Shoulder abduction: 4/5 Eval: L: Shoulder flexion: 4/5, Shoulder abduction: 4/5 01/21/2023: L: Shoulder flexion: 4+/5, Shoulder abduction: 4+/5 12/22/2022: L: Shoulder flexion: 4/5, Shoulder abduction: 4/5 Eval: L: Shoulder flexion: 4/5, Shoulder abduction: 4/5 Goal status: Achieved  3.  Pt. will improve L hand FMC/dexterity skills to be able to sign her name on checks with 75% legibility, using writing aids as needed. Baseline: 01/21/2023: 75% legibility, cursive 75% legibility 12/22/2022: 12/22/2023: Print: 50% legibility, cursive 50% legibility Eval: Print: 25% legibility, Cursive: 25% legibility Goal status: Achieved  4. Pt. will perform check writing tasks with 75% legibility.             Baseline: 02/18/2023: 50% legibility 01/21/2023: 50% legibility  Goal status: Ongoing  5. Pt. will independently compose a note card correspondence with 75% legibility.  Baseline: 02/18/2023: 50% 01/21/2023: 50% legibility  Goal status: Ongoing ASSESSMENT:  CLINICAL IMPRESSION:  Pt. was able to formulate written recipes with 25%, and list of printed words with  50% legibility in printed form. Pt. presented with more difficulty writing on small lines, and in small contained spaces. Pt. continues to present with limited bilateral [facility] skills which hinders  engagement in ADL tasks, IADL tasks, and overall function. Pt. continues to benefit from OT services to improve LUE strength, and [facility] skills in order to improve writing legibility to increase independence and enhance engagement in ADL/IADL tasks.   PERFORMANCE DEFICITS: in functional skills including ADLs, IADLs, coordination, strength, and Fine motor control, cognitive skills including problem solving, and psychosocial skills including coping strategies, environmental adaptation, and routines and behaviors.   IMPAIRMENTS: are limiting patient from ADLs and IADLs.   CO-MORBIDITIES: may have co-morbidities  that affects occupational performance. Patient will benefit from skilled OT to address above impairments and improve overall function.  MODIFICATION OR ASSISTANCE TO COMPLETE EVALUATION: Min-Moderate modification of tasks or assist with assess necessary to complete an evaluation.  OT OCCUPATIONAL PROFILE AND HISTORY: Detailed assessment: Review of records and additional review of physical, cognitive, psychosocial history related to current functional performance.  CLINICAL DECISION MAKING: Moderate - several treatment options, min-mod task modification necessary  REHAB POTENTIAL: Good  EVALUATION COMPLEXITY: Moderate    PLAN:  OT FREQUENCY: 2x/week  OT DURATION: 12 weeks  PLANNED INTERVENTIONS: self care/ADL training, therapeutic exercise, therapeutic activity, neuromuscular re-education, and patient/family education  RECOMMENDED OTHER SERVICES: N/A  CONSULTED AND AGREED WITH PLAN OF CARE: Patient  PLAN FOR NEXT SESSION: Initiate treatment  Olegario Messier, MS, OTR/L  02/23/23, 2:25 PM

## 2023-02-25 ENCOUNTER — Ambulatory Visit: Payer: Medicare Other | Admitting: Occupational Therapy

## 2023-02-25 DIAGNOSIS — R278 Other lack of coordination: Secondary | ICD-10-CM

## 2023-02-25 DIAGNOSIS — M6281 Muscle weakness (generalized): Secondary | ICD-10-CM

## 2023-02-25 NOTE — Therapy (Signed)
Occupational Therapy  Neuro Treatment Note  Patient Name: Susan [name] MRN: [id number] DOB:Aug 30, 1926, 89 y.o., female Today's Date: 02/25/2023  PCP: [name] REFERRING PROVIDER: [name]  END OF SESSION:  OT End of Session - 02/25/23 1444     Visit Number 22    Number of Visits 48    Date for OT Re-Evaluation 04/15/23    OT Start Time 1400    OT Stop Time 1445    OT Time Calculation (min) 45 min    Activity Tolerance Patient tolerated treatment well    Behavior During Therapy [facility] for tasks assessed/performed            Past Medical History:  Diagnosis Date   Anemia    Balance problem    Diabetes mellitus without complication (HCC)    Diverticulosis    Heart murmur    Hypercholesteremia    Hypertension    Stroke [facility])    TIA 2006   TIA (transient ischemic attack) 2006   Past Surgical History:  Procedure Laterality Date   CATARACT EXTRACTION W/PHACO Left 05/22/2015   Procedure: CATARACT EXTRACTION PHACO AND INTRAOCULAR LENS PLACEMENT (IOC);  Surgeon: Galen Manila, MD;  Location: [facility] ORS;  Service: Ophthalmology;  Laterality: Left;  [place]: 01:07.9 AP%: 23.0 CDE: 15.62 Lot# [id number]   CATARACT EXTRACTION W/PHACO Right 06/12/2015   Procedure: CATARACT EXTRACTION PHACO AND INTRAOCULAR LENS PLACEMENT (IOC);  Surgeon: Galen Manila, MD;  Location: [facility] ORS;  Service: Ophthalmology;  Laterality: Right;  [place] 00:57 AP% 17.1 CDE 9.86 fluid pack lot # [id number]   COLON SURGERY     resection   COLONOSCOPY WITH PROPOFOL N/A 10/29/2021   Procedure: COLONOSCOPY WITH PROPOFOL;  Surgeon: Regis Bill, MD;  Location: [facility] ENDOSCOPY;  Service: Endoscopy;  Laterality: N/A;   ECTOPIC PREGNANCY SURGERY     HERNIA REPAIR     TONSILLECTOMY     Patient Active Problem List   Diagnosis Date Noted   Renal lesion, right 10/30/2021   Acute blood loss anemia 10/30/2021   Lower GI bleed 10/27/2021   GI bleeding 05/29/2021   CVA (cerebral vascular accident) (HCC) 07/04/2017     ONSET DATE: 04/2018  REFERRING DIAG: CVA  THERAPY DIAG:  Muscle weakness (generalized)  Other lack of coordination  Rationale for Evaluation and Treatment: Rehabilitation  SUBJECTIVE:   SUBJECTIVE STATEMENT:  Pt. reports doing well today. Pt accompanied by: self  PERTINENT HISTORY: Pt. is a 89 y.o. female who has a past medical history of CVA, hypertension, type ll DM, chronic anemia, history of diverticular bleed in February 2023. Pt. Has had a recent fall on 02/22/2022 and was found laying on the floor and had been laying there for a couple of days. Pt. Presents with generalized weakness and was referred to OT by her PCP to help with decrease in handwriting skills over the last few years.  PRECAUTIONS: None   WEIGHT BEARING RESTRICTIONS: No  PAIN:  Are you having pain? No  FALLS: Has patient fallen in last 6 months? Yes. Number of falls 1-2  LIVING ENVIRONMENT: Lives with: lives alone Lives in: Independent living facility that has apartments called "[facility]" Stairs: Engineer, structural Has following equipment at home: Dan Humphreys - 2 wheeled, Environmental consultant - 4 wheeled, Wheelchair (power), shower chair, and Grab bars  PLOF: Independent  PATIENT GOALS: Desires to improve handwriting so that she is able to write her own checks  OBJECTIVE:   HAND DOMINANCE: Left  ADLs:  Eating: Independent with feeding self Grooming:  Independent UB Dressing: Able to put blouse on, does not wear shirt with buttons LB Dressing: Able to dress self with elastic pants, able to put socks and shoes Toileting: Independent Bathing: Pt. Has someone who comes and gives her a shower Tub Shower transfers: Depends on Aid to help get in and out of the walk in shower Equipment: Shower seat with back, Grab bars, Walk in shower, and bed side commode  IADLs: Shopping: Grand daughter does the grocery shopping however Pt. Reports going with her sometimes Light housekeeping: Pt. States she keeps a clean house and  can vacuum from her wheelchair Meal Prep: Meals on wheels brings her food, grand daughter will also help prepare light meals Community mobility: Public transportation Medication management: Independent, uses pill Nature conservation officer: Granddaguther helps pay pills and writes checks Handwriting: Print 25% legible Cursive: 25% legible Pt. Was asked to write name on paper and instead of writing side to side Pt. wrote vertically  MOBILITY STATUS: Needs Assist: Wheelchair  POSTURE COMMENTS:  No Significant postural limitations Sitting balance: WFL  ACTIVITY TOLERANCE: Activity tolerance: WFL  FUNCTIONAL OUTCOME MEASURES: FOTO: 69 TR: 68  UPPER EXTREMITY ROM:  [facility]  UPPER EXTREMITY MMT:     MMT Right eval Right 12/22/22 Right 01/21/23 Left eval Left 12/22/22 Left 01/21/23 Left 02/18/23  Shoulder flexion 4+/5 4+/5 5/5 4/5 4+/5 4+/5 5/5  Shoulder abduction 4+/5 4+/5 5/5 4/5 4+/5 4+/5 5/5  Shoulder adduction         Shoulder extension         Shoulder internal rotation         Shoulder external rotation         Middle trapezius         Lower trapezius         Elbow flexion 5/5 5/5 5/5 4+/5 4+/5 5/5 5/5  Elbow extension 5/5 5/5 5/5 4+/5 4+/5 5/5 5/5  Wrist flexion 4+/5 4+/5 5/5 4/5 4+/5 4+/5 5/5  Wrist extension 4+/5 4+/5 5/5 4/5 4+/5 4+/5 5/5  Wrist ulnar deviation         Wrist radial deviation         Wrist pronation         Wrist supination         (Blank rows = not tested)  HAND FUNCTION: Grip strength: Right: 40 lbs; Left: 44 lbs, Lateral pinch: Right: 7 lbs, Left: 8 lbs, and 3 point pinch: Right: 6 lbs, Left: 7 lbs 12/22/2022: Right: 40 lbs; Left: 40 lbs, Lateral pinch: Right: 11 lbs, Left: 8 lbs, and 3 point pinch: Right: 6 lbs, Left: 5 lbs 01/21/2023: Right: 40 lbs; Left: 45 lbs, Lateral pinch: Right: 11 lbs, Left:17 lbs, and 3 point pinch: Right: 6 lbs, Left: 6 lbs 02/18/2023 Right: 37 lbs; Left: 45 lbs, Lateral pinch: Right: 7 lbs, Left: 20 lbs, and 3  point pinch: Right:10 lbs, Left: 6 lbs   COORDINATION: 9 Hole Peg test: Right: 1 minute 10 sec; Left: 58 sec 12/22/2022: 9 Hole Peg test: Right: 1 minute & 1 sec; Left: 53 sec 01/21/2023: 9 Hole Peg test: Right: 58 sec; Left: 58 sec 02/18/2023: 9 Hole Peg test: Right: 1 min. & 14 sec.; Left: 1 min& 18 sec.   SENSATION: Not tested  MUSCLE TONE: WFL  COGNITION: Overall cognitive status: Within functional limits for tasks assessed  VISION: Subjective report: Pt. reports that she wears glasses for most of the time however takes them off sometimes. Baseline vision: Wear glasses most of  the time Visual history: cataracts  PERCEPTION: WFL  PRAXIS: WFL   TODAY'S TREATMENT:                                                                                                                              DATE: 02/25/2023   Self-care:  Pt. worked on Chiropractor, and Nurse, adult, and lists of words.   Neuro-muscular re-education:  Pt. performed left hand FMC tasks using manual tweezers t grasp, and place 1/8" pegs onto a pegboard.      PATIENT EDUCATION: Education details: FMC, UE thereapeutic ex Person educated: Patient Education method: Explanation, Demonstration, and Verbal cues Education comprehension: verbalized understanding, demonstrated understanding; further training needed  HOME EXERCISE PROGRAM: Handwriting practice   GOALS: Goals reviewed with patient? Yes  SHORT TERM GOALS: Target date: 01/08/2023  Pt. Will improve FOTO score by 2 points to reflect improved perceived function performance in specific ADL/IADL. Baseline: 02/18/2023: 61 12/22/2022: FOTO: 77 Eval: FOTO: 69 TR: 68 Goal status: MET  LONG TERM GOALS: Target date: 04/15/2023  Pt. will improve R and L hand FMC skills by 10 seconds of speed to independently manipulate small objects during ADL/IADL tasks. Baseline: 02/18/2023:  9 Hole Peg test: Right: 1 min & 14sec;  Left: 1 min& 18 sec. 01/21/2023: Right: 58 sec. & Left 58 sec. 12/22/2022:9 Hole Peg test: Right: 1 minute & 1 sec; Left: 53 sec. Eval: 9 Hole Peg test: Right: 1 minute 10 sec; Left: 58 sec Goal status: Ongoing  2.  Pt. will improve L shoulder strength by 1 mm grade to be able to sustain LUE elevation while performing ADL/IADL tasks. Baseline: 02/18/2023:  L: Shoulder flexion: 5/5, Shoulder abduction: 5/5 12/22/2022: L: Shoulder flexion: 4/5, Shoulder abduction: 4/5 Eval: L: Shoulder flexion: 4/5, Shoulder abduction: 4/5 01/21/2023: L: Shoulder flexion: 4+/5, Shoulder abduction: 4+/5 12/22/2022: L: Shoulder flexion: 4/5, Shoulder abduction: 4/5 Eval: L: Shoulder flexion: 4/5, Shoulder abduction: 4/5 Goal status: Achieved  3.  Pt. will improve L hand FMC/dexterity skills to be able to sign her name on checks with 75% legibility, using writing aids as needed. Baseline: 01/21/2023: 75% legibility, cursive 75% legibility 12/22/2022: 12/22/2023: Print: 50% legibility, cursive 50% legibility Eval: Print: 25% legibility, Cursive: 25% legibility Goal status: Achieved  4. Pt. will perform check writing tasks with 75% legibility.             Baseline: 02/18/2023: 50% legibility 01/21/2023: 50% legibility  Goal status: Ongoing  5. Pt. will independently compose a note card correspondence with 75% legibility.  Baseline: 02/18/2023: 50% 01/21/2023: 50% legibility  Goal status: Ongoing ASSESSMENT:  CLINICAL IMPRESSION:  Pt. reports that she had a prickling/pins and needles sensation intermittently in the tips of her fingers when writing at home. Pt. Was encouraged to use a soft pencil grip which Pt. Reports has helped.  Pt. was able to formulate lists of words in 1/2" space with 50-75% legibility in both printed, and  cursive form. Pt. continues to present with limited bilateral [facility] skills which hinders engagement in ADL tasks, IADL tasks, and overall function. Pt. continues to benefit from OT services to improve  LUE strength, and [facility] skills in order to improve writing legibility to increase independence and enhance engagement in ADL/IADL tasks.   PERFORMANCE DEFICITS: in functional skills including ADLs, IADLs, coordination, strength, and Fine motor control, cognitive skills including problem solving, and psychosocial skills including coping strategies, environmental adaptation, and routines and behaviors.   IMPAIRMENTS: are limiting patient from ADLs and IADLs.   CO-MORBIDITIES: may have co-morbidities  that affects occupational performance. Patient will benefit from skilled OT to address above impairments and improve overall function.  MODIFICATION OR ASSISTANCE TO COMPLETE EVALUATION: Min-Moderate modification of tasks or assist with assess necessary to complete an evaluation.  OT OCCUPATIONAL PROFILE AND HISTORY: Detailed assessment: Review of records and additional review of physical, cognitive, psychosocial history related to current functional performance.  CLINICAL DECISION MAKING: Moderate - several treatment options, min-mod task modification necessary  REHAB POTENTIAL: Good  EVALUATION COMPLEXITY: Moderate    PLAN:  OT FREQUENCY: 2x/week  OT DURATION: 12 weeks  PLANNED INTERVENTIONS: self care/ADL training, therapeutic exercise, therapeutic activity, neuromuscular re-education, and patient/family education  RECOMMENDED OTHER SERVICES: N/A  CONSULTED AND AGREED WITH PLAN OF CARE: Patient  PLAN FOR NEXT SESSION: Initiate treatment  Olegario Messier, MS, OTR/L  02/25/23, 2:50 PM

## 2023-03-02 ENCOUNTER — Ambulatory Visit: Payer: Medicare Other | Admitting: Occupational Therapy

## 2023-03-02 DIAGNOSIS — M6281 Muscle weakness (generalized): Secondary | ICD-10-CM

## 2023-03-02 NOTE — Therapy (Addendum)
Occupational Therapy  Neuro Treatment Note  Patient Name: Susan [name] MRN: [id number] DOB:04-30-1926, 89 y.o., female Today's Date: 03/02/2023  PCP: [name] REFERRING PROVIDER: [name]  END OF SESSION:  OT End of Session - 03/02/23 1404     Visit Number 23    Number of Visits 48    OT Start Time 1400    OT Stop Time 1445    OT Time Calculation (min) 45 min    Activity Tolerance Patient tolerated treatment well    Behavior During Therapy [facility] for tasks assessed/performed            Past Medical History:  Diagnosis Date   Anemia    Balance problem    Diabetes mellitus without complication (HCC)    Diverticulosis    Heart murmur    Hypercholesteremia    Hypertension    Stroke [facility])    TIA 2006   TIA (transient ischemic attack) 2006   Past Surgical History:  Procedure Laterality Date   CATARACT EXTRACTION W/PHACO Left 05/22/2015   Procedure: CATARACT EXTRACTION PHACO AND INTRAOCULAR LENS PLACEMENT (IOC);  Surgeon: Galen Manila, MD;  Location: [facility] ORS;  Service: Ophthalmology;  Laterality: Left;  [place]: 01:07.9 AP%: 23.0 CDE: 15.62 Lot# [id number]   CATARACT EXTRACTION W/PHACO Right 06/12/2015   Procedure: CATARACT EXTRACTION PHACO AND INTRAOCULAR LENS PLACEMENT (IOC);  Surgeon: Galen Manila, MD;  Location: [facility] ORS;  Service: Ophthalmology;  Laterality: Right;  [place] 00:57 AP% 17.1 CDE 9.86 fluid pack lot # [id number]   COLON SURGERY     resection   COLONOSCOPY WITH PROPOFOL N/A 10/29/2021   Procedure: COLONOSCOPY WITH PROPOFOL;  Surgeon: Regis Bill, MD;  Location: [facility] ENDOSCOPY;  Service: Endoscopy;  Laterality: N/A;   ECTOPIC PREGNANCY SURGERY     HERNIA REPAIR     TONSILLECTOMY     Patient Active Problem List   Diagnosis Date Noted   Renal lesion, right 10/30/2021   Acute blood loss anemia 10/30/2021   Lower GI bleed 10/27/2021   GI bleeding 05/29/2021   CVA (cerebral vascular accident) (HCC) 07/04/2017    ONSET DATE: 04/2018  REFERRING  DIAG: CVA  THERAPY DIAG:  Muscle weakness (generalized)  Rationale for Evaluation and Treatment: Rehabilitation  SUBJECTIVE:   SUBJECTIVE STATEMENT:  Pt. reports having had a nice visit with her grand daughters this weekend. Pt accompanied by: self  PERTINENT HISTORY: Pt. is a 89 y.o. female who has a past medical history of CVA, hypertension, type ll DM, chronic anemia, history of diverticular bleed in February 2023. Pt. Has had a recent fall on 02/22/2022 and was found laying on the floor and had been laying there for a couple of days. Pt. Presents with generalized weakness and was referred to OT by her PCP to help with decrease in handwriting skills over the last few years.  PRECAUTIONS: None   WEIGHT BEARING RESTRICTIONS: No  PAIN:  Are you having pain? No  FALLS: Has patient fallen in last 6 months? Yes. Number of falls 1-2  LIVING ENVIRONMENT: Lives with: lives alone Lives in: Independent living facility that has apartments called "[facility]" Stairs: Engineer, structural Has following equipment at home: Dan Humphreys - 2 wheeled, Environmental consultant - 4 wheeled, Wheelchair (power), shower chair, and Grab bars  PLOF: Independent  PATIENT GOALS: Desires to improve handwriting so that she is able to write her own checks  OBJECTIVE:   HAND DOMINANCE: Left  ADLs:  Eating: Independent with feeding self Grooming: Independent UB Dressing: Able to  put blouse on, does not wear shirt with buttons LB Dressing: Able to dress self with elastic pants, able to put socks and shoes Toileting: Independent Bathing: Pt. Has someone who comes and gives her a shower Tub Shower transfers: Depends on Aid to help get in and out of the walk in shower Equipment: Shower seat with back, Grab bars, Walk in shower, and bed side commode  IADLs: Shopping: Grand daughter does the grocery shopping however Pt. Reports going with her sometimes Light housekeeping: Pt. States she keeps a clean house and can vacuum from her  wheelchair Meal Prep: Meals on wheels brings her food, grand daughter will also help prepare light meals Community mobility: Public transportation Medication management: Independent, uses pill Nature conservation officer: [place]uther helps pay pills and writes checks Handwriting: Print 25% legible Cursive: 25% legible Pt. Was asked to write name on paper and instead of writing side to side Pt. wrote vertically  MOBILITY STATUS: Needs Assist: Wheelchair  POSTURE COMMENTS:  No Significant postural limitations Sitting balance: WFL  ACTIVITY TOLERANCE: Activity tolerance: WFL  FUNCTIONAL OUTCOME MEASURES: FOTO: 69 TR: 68  UPPER EXTREMITY ROM:  [facility]  UPPER EXTREMITY MMT:     MMT Right eval Right 12/22/22 Right 01/21/23 Left eval Left 12/22/22 Left 01/21/23 Left 02/18/23  Shoulder flexion 4+/5 4+/5 5/5 4/5 4+/5 4+/5 5/5  Shoulder abduction 4+/5 4+/5 5/5 4/5 4+/5 4+/5 5/5  Shoulder adduction         Shoulder extension         Shoulder internal rotation         Shoulder external rotation         Middle trapezius         Lower trapezius         Elbow flexion 5/5 5/5 5/5 4+/5 4+/5 5/5 5/5  Elbow extension 5/5 5/5 5/5 4+/5 4+/5 5/5 5/5  Wrist flexion 4+/5 4+/5 5/5 4/5 4+/5 4+/5 5/5  Wrist extension 4+/5 4+/5 5/5 4/5 4+/5 4+/5 5/5  Wrist ulnar deviation         Wrist radial deviation         Wrist pronation         Wrist supination         (Blank rows = not tested)  HAND FUNCTION: Grip strength: Right: 40 lbs; Left: 44 lbs, Lateral pinch: Right: 7 lbs, Left: 8 lbs, and 3 point pinch: Right: 6 lbs, Left: 7 lbs 12/22/2022: Right: 40 lbs; Left: 40 lbs, Lateral pinch: Right: 11 lbs, Left: 8 lbs, and 3 point pinch: Right: 6 lbs, Left: 5 lbs 01/21/2023: Right: 40 lbs; Left: 45 lbs, Lateral pinch: Right: 11 lbs, Left:17 lbs, and 3 point pinch: Right: 6 lbs, Left: 6 lbs 02/18/2023 Right: 37 lbs; Left: 45 lbs, Lateral pinch: Right: 7 lbs, Left: 20 lbs, and 3 point pinch: Right:10  lbs, Left: 6 lbs   COORDINATION: 9 Hole Peg test: Right: 1 minute 10 sec; Left: 58 sec 12/22/2022: 9 Hole Peg test: Right: 1 minute & 1 sec; Left: 53 sec 01/21/2023: 9 Hole Peg test: Right: 58 sec; Left: 58 sec 02/18/2023: 9 Hole Peg test: Right: 1 min. & 14 sec.; Left: 1 min& 18 sec.   SENSATION: Not tested  MUSCLE TONE: WFL  COGNITION: Overall cognitive status: Within functional limits for tasks assessed  VISION: Subjective report: Pt. reports that she wears glasses for most of the time however takes them off sometimes. Baseline vision: Wear glasses most of the time Visual history: cataracts  PERCEPTION: WFL  PRAXIS: WFL   TODAY'S TREATMENT:                                                                                                                              DATE: 03/02/2023   Neuro-muscular re-education:  Pt. worked on left hand [facility] skills grasping 1" sticks, 1/4" collars, and 1/4" washers. Pt. worked on storing the objects in the palm, and translatory skills moving the items from the palm of the hand to the tip of the 2nd digit, and thumb. Pt. worked on removing the pegs using bilateral alternating hand patterns.     PATIENT EDUCATION: Education details: FMC, UE thereapeutic ex Person educated: Patient Education method: Explanation, Demonstration, and Verbal cues Education comprehension: verbalized understanding, demonstrated understanding; further training needed  HOME EXERCISE PROGRAM: Handwriting practice   GOALS: Goals reviewed with patient? Yes  SHORT TERM GOALS: Target date: 01/08/2023  Pt. Will improve FOTO score by 2 points to reflect improved perceived function performance in specific ADL/IADL. Baseline: 02/18/2023: 61 12/22/2022: FOTO: 77 Eval: FOTO: 69 TR: 68 Goal status: MET  LONG TERM GOALS: Target date: 04/15/2023  Pt. will improve R and L hand FMC skills by 10 seconds of speed to independently manipulate small objects during ADL/IADL  tasks. Baseline: 02/18/2023:  9 Hole Peg test: Right: 1 min & 14sec; Left: 1 min& 18 sec. 01/21/2023: Right: 58 sec. & Left 58 sec. 12/22/2022:9 Hole Peg test: Right: 1 minute & 1 sec; Left: 53 sec. Eval: 9 Hole Peg test: Right: 1 minute 10 sec; Left: 58 sec Goal status: Ongoing  2.  Pt. will improve L shoulder strength by 1 mm grade to be able to sustain LUE elevation while performing ADL/IADL tasks. Baseline: 02/18/2023:  L: Shoulder flexion: 5/5, Shoulder abduction: 5/5 12/22/2022: L: Shoulder flexion: 4/5, Shoulder abduction: 4/5 Eval: L: Shoulder flexion: 4/5, Shoulder abduction: 4/5 01/21/2023: L: Shoulder flexion: 4+/5, Shoulder abduction: 4+/5 12/22/2022: L: Shoulder flexion: 4/5, Shoulder abduction: 4/5 Eval: L: Shoulder flexion: 4/5, Shoulder abduction: 4/5 Goal status: Achieved  3.  Pt. will improve L hand FMC/dexterity skills to be able to sign her name on checks with 75% legibility, using writing aids as needed. Baseline: 01/21/2023: 75% legibility, cursive 75% legibility 12/22/2022: 12/22/2023: Print: 50% legibility, cursive 50% legibility Eval: Print: 25% legibility, Cursive: 25% legibility Goal status: Achieved  4. Pt. will perform check writing tasks with 75% legibility.             Baseline: 02/18/2023: 50% legibility 01/21/2023: 50% legibility  Goal status: Ongoing  5. Pt. will independently compose a note card correspondence with 75% legibility.  Baseline: 02/18/2023: 50% 01/21/2023: 50% legibility  Goal status: Ongoing ASSESSMENT:  CLINICAL IMPRESSION:  Pt. is improving with left hand [facility] skills grasping, and manipulating the small objects on the grooved pegboard. Pt. Dropped multiple pegs when attempting translatory movements storing the pegs, and flat washers in the palm of her left hand. Pt. dropped fewer  washers.Pt. required increased time to complete bilateral alternating hand patterns. Pt. was provided with list forms with smaller spaced lines to practice writing legibly  in smaller spaces at home. Pt. continues to present with limited bilateral [facility] skills which hinders engagement in ADL tasks, IADL tasks, and overall function. Pt. continues to benefit from OT services to improve LUE strength, and [facility] skills in order to improve writing legibility to increase independence and enhance engagement in ADL/IADL tasks.   PERFORMANCE DEFICITS: in functional skills including ADLs, IADLs, coordination, strength, and Fine motor control, cognitive skills including problem solving, and psychosocial skills including coping strategies, environmental adaptation, and routines and behaviors.   IMPAIRMENTS: are limiting patient from ADLs and IADLs.   CO-MORBIDITIES: may have co-morbidities  that affects occupational performance. Patient will benefit from skilled OT to address above impairments and improve overall function.  MODIFICATION OR ASSISTANCE TO COMPLETE EVALUATION: Min-Moderate modification of tasks or assist with assess necessary to complete an evaluation.  OT OCCUPATIONAL PROFILE AND HISTORY: Detailed assessment: Review of records and additional review of physical, cognitive, psychosocial history related to current functional performance.  CLINICAL DECISION MAKING: Moderate - several treatment options, min-mod task modification necessary  REHAB POTENTIAL: Good  EVALUATION COMPLEXITY: Moderate    PLAN:  OT FREQUENCY: 2x/week  OT DURATION: 12 weeks  PLANNED INTERVENTIONS: self care/ADL training, therapeutic exercise, therapeutic activity, neuromuscular re-education, and patient/family education  RECOMMENDED OTHER SERVICES: N/A  CONSULTED AND AGREED WITH PLAN OF CARE: Patient  PLAN FOR NEXT SESSION: Initiate treatment  Olegario Messier, MS, OTR/L  03/02/23, 2:34 PM

## 2023-03-04 ENCOUNTER — Ambulatory Visit: Payer: Medicare Other | Admitting: Occupational Therapy

## 2023-03-04 DIAGNOSIS — M6281 Muscle weakness (generalized): Secondary | ICD-10-CM | POA: Diagnosis not present

## 2023-03-04 DIAGNOSIS — R278 Other lack of coordination: Secondary | ICD-10-CM

## 2023-03-04 NOTE — Therapy (Addendum)
Occupational Therapy  Neuro Treatment Note  Patient Name: Susan [name] MRN: [id number] DOB:[birth year], 89 y.o., female Today's Date: 03/04/2023  PCP: [name] REFERRING PROVIDER: [name]  END OF SESSION:  OT End of Session - 03/04/23 1409     Visit Number 24    Number of Visits 48    Date for OT Re-Evaluation 04/15/23    OT Start Time 1400    OT Stop Time 1445    OT Time Calculation (min) 45 min    Equipment Utilized During Treatment Wheelchair    Activity Tolerance Patient tolerated treatment well    Behavior During Therapy [facility] for tasks assessed/performed            Past Medical History:  Diagnosis Date   Anemia    Balance problem    Diabetes mellitus without complication (HCC)    Diverticulosis    Heart murmur    Hypercholesteremia    Hypertension    Stroke [facility])    TIA 2006   TIA (transient ischemic attack) 2006   Past Surgical History:  Procedure Laterality Date   CATARACT EXTRACTION W/PHACO Left 05/22/2015   Procedure: CATARACT EXTRACTION PHACO AND INTRAOCULAR LENS PLACEMENT (IOC);  Surgeon: Galen Manila, MD;  Location: [facility] ORS;  Service: Ophthalmology;  Laterality: Left;  [place]: 01:07.9 AP%: 23.0 CDE: 15.62 Lot# [id number]   CATARACT EXTRACTION W/PHACO Right 06/12/2015   Procedure: CATARACT EXTRACTION PHACO AND INTRAOCULAR LENS PLACEMENT (IOC);  Surgeon: Galen Manila, MD;  Location: [facility] ORS;  Service: Ophthalmology;  Laterality: Right;  [place] 00:57 AP% 17.1 CDE 9.86 fluid pack lot # [id number]   COLON SURGERY     resection   COLONOSCOPY WITH PROPOFOL N/A 10/29/2021   Procedure: COLONOSCOPY WITH PROPOFOL;  Surgeon: Regis Bill, MD;  Location: [facility] ENDOSCOPY;  Service: Endoscopy;  Laterality: N/A;   ECTOPIC PREGNANCY SURGERY     HERNIA REPAIR     TONSILLECTOMY     Patient Active Problem List   Diagnosis Date Noted   Renal lesion, right 10/30/2021   Acute blood loss anemia 10/30/2021   Lower GI bleed 10/27/2021   GI bleeding 05/29/2021    CVA (cerebral vascular accident) (HCC) 07/04/2017    ONSET DATE: 04/2018  REFERRING DIAG: CVA  THERAPY DIAG:  Muscle weakness (generalized)  Other lack of coordination  Rationale for Evaluation and Treatment: Rehabilitation  SUBJECTIVE:   SUBJECTIVE STATEMENT:  Pt. reports being excited about going to her great grand daughters home for Thanksgiving this year. Pt accompanied by: self  PERTINENT HISTORY: Pt. is a 89 y.o. female who has a past medical history of CVA, hypertension, type ll DM, chronic anemia, history of diverticular bleed in February 2023. Pt. Has had a recent fall on 02/22/2022 and was found laying on the floor and had been laying there for a couple of days. Pt. Presents with generalized weakness and was referred to OT by her PCP to help with decrease in handwriting skills over the last few years.  PRECAUTIONS: None   WEIGHT BEARING RESTRICTIONS: No  PAIN:  Are you having pain? No  FALLS: Has patient fallen in last 6 months? Yes. Number of falls 1-2  LIVING ENVIRONMENT: Lives with: lives alone Lives in: Independent living facility that has apartments called "[facility]" Stairs: Engineer, structural Has following equipment at home: Dan Humphreys - 2 wheeled, Environmental consultant - 4 wheeled, Wheelchair (power), shower chair, and Grab bars  PLOF: Independent  PATIENT GOALS: Desires to improve handwriting so that she is able to write  her own checks  OBJECTIVE:   HAND DOMINANCE: Left  ADLs:  Eating: Independent with feeding self Grooming: Independent UB Dressing: Able to put blouse on, does not wear shirt with buttons LB Dressing: Able to dress self with elastic pants, able to put socks and shoes Toileting: Independent Bathing: Pt. Has someone who comes and gives her a shower Tub Shower transfers: Depends on Aid to help get in and out of the walk in shower Equipment: Shower seat with back, Grab bars, Walk in shower, and bed side commode  IADLs: Shopping: Grand daughter does  the grocery shopping however Pt. Reports going with her sometimes Light housekeeping: Pt. States she keeps a clean house and can vacuum from her wheelchair Meal Prep: Meals on wheels brings her food, grand daughter will also help prepare light meals Community mobility: Public transportation Medication management: Independent, uses pill Nature conservation officer: Granddaguther helps pay pills and writes checks Handwriting: Print 25% legible Cursive: 25% legible Pt. Was asked to write name on paper and instead of writing side to side Pt. wrote vertically  MOBILITY STATUS: Needs Assist: Wheelchair  POSTURE COMMENTS:  No Significant postural limitations Sitting balance: WFL  ACTIVITY TOLERANCE: Activity tolerance: WFL  FUNCTIONAL OUTCOME MEASURES: FOTO: 69 TR: 68  UPPER EXTREMITY ROM:  [facility]  UPPER EXTREMITY MMT:     MMT Right eval Right 12/22/22 Right 01/21/23 Left eval Left 12/22/22 Left 01/21/23 Left 02/18/23  Shoulder flexion 4+/5 4+/5 5/5 4/5 4+/5 4+/5 5/5  Shoulder abduction 4+/5 4+/5 5/5 4/5 4+/5 4+/5 5/5  Shoulder adduction         Shoulder extension         Shoulder internal rotation         Shoulder external rotation         Middle trapezius         Lower trapezius         Elbow flexion 5/5 5/5 5/5 4+/5 4+/5 5/5 5/5  Elbow extension 5/5 5/5 5/5 4+/5 4+/5 5/5 5/5  Wrist flexion 4+/5 4+/5 5/5 4/5 4+/5 4+/5 5/5  Wrist extension 4+/5 4+/5 5/5 4/5 4+/5 4+/5 5/5  Wrist ulnar deviation         Wrist radial deviation         Wrist pronation         Wrist supination         (Blank rows = not tested)  HAND FUNCTION: Grip strength: Right: 40 lbs; Left: 44 lbs, Lateral pinch: Right: 7 lbs, Left: 8 lbs, and 3 point pinch: Right: 6 lbs, Left: 7 lbs 12/22/2022: Right: 40 lbs; Left: 40 lbs, Lateral pinch: Right: 11 lbs, Left: 8 lbs, and 3 point pinch: Right: 6 lbs, Left: 5 lbs 01/21/2023: Right: 40 lbs; Left: 45 lbs, Lateral pinch: Right: 11 lbs, Left:17 lbs, and 3 point  pinch: Right: 6 lbs, Left: 6 lbs 02/18/2023 Right: 37 lbs; Left: 45 lbs, Lateral pinch: Right: 7 lbs, Left: 20 lbs, and 3 point pinch: Right:10 lbs, Left: 6 lbs   COORDINATION: 9 Hole Peg test: Right: 1 minute 10 sec; Left: 58 sec 12/22/2022: 9 Hole Peg test: Right: 1 minute & 1 sec; Left: 53 sec 01/21/2023: 9 Hole Peg test: Right: 58 sec; Left: 58 sec 02/18/2023: 9 Hole Peg test: Right: 1 min. & 14 sec.; Left: 1 min& 18 sec.   SENSATION: Not tested  MUSCLE TONE: WFL  COGNITION: Overall cognitive status: Within functional limits for tasks assessed  VISION: Subjective report: Pt. reports that  she wears glasses for most of the time however takes them off sometimes. Baseline vision: Wear glasses most of the time Visual history: cataracts  PERCEPTION: WFL  PRAXIS: WFL   TODAY'S TREATMENT:                                                                                                                              DATE: 03/04/2023   Therapeutic Exercise:  Pt. worked bilateral gross grip strengthening 10 reps each with red bands for 25#, 1.5# DigiFlex for digit flexion for individual,  simultaneous alternating digit flexion. Pt. worked on bilateral pinch strengthening in the left hand for lateral, and 3pt. pinch using yellow, red, green, and blue resistive clips. Pt. worked on placing the clips at various vertical and horizontal angles. Tactile and verbal cues were required for eliciting the desired movement.    Neuro-muscular re-education:  Pt. performed left [facility] tasks using the Grooved pegboard. Pt. worked on grasping the grooved pegs from a horizontal position, and moving the pegs to a vertical position in the hand to prepare for placing them in the grooved slot.    PATIENT EDUCATION: Education details: FMC, UE thereapeutic ex Person educated: Patient Education method: Explanation, Demonstration, and Verbal cues Education comprehension: verbalized understanding, demonstrated  understanding; further training needed  HOME EXERCISE PROGRAM: Handwriting practice   GOALS: Goals reviewed with patient? Yes  SHORT TERM GOALS: Target date: 01/08/2023  Pt. Will improve FOTO score by 2 points to reflect improved perceived function performance in specific ADL/IADL. Baseline: 02/18/2023: 61 12/22/2022: FOTO: 77 Eval: FOTO: 69 TR: 68 Goal status: MET  LONG TERM GOALS: Target date: 04/15/2023  Pt. will improve R and L hand FMC skills by 10 seconds of speed to independently manipulate small objects during ADL/IADL tasks. Baseline: 02/18/2023:  9 Hole Peg test: Right: 1 min & 14sec; Left: 1 min& 18 sec. 01/21/2023: Right: 58 sec. & Left 58 sec. 12/22/2022:9 Hole Peg test: Right: 1 minute & 1 sec; Left: 53 sec. Eval: 9 Hole Peg test: Right: 1 minute 10 sec; Left: 58 sec Goal status: Ongoing  2.  Pt. will improve L shoulder strength by 1 mm grade to be able to sustain LUE elevation while performing ADL/IADL tasks. Baseline: 02/18/2023:  L: Shoulder flexion: 5/5, Shoulder abduction: 5/5 12/22/2022: L: Shoulder flexion: 4/5, Shoulder abduction: 4/5 Eval: L: Shoulder flexion: 4/5, Shoulder abduction: 4/5 01/21/2023: L: Shoulder flexion: 4+/5, Shoulder abduction: 4+/5 12/22/2022: L: Shoulder flexion: 4/5, Shoulder abduction: 4/5 Eval: L: Shoulder flexion: 4/5, Shoulder abduction: 4/5 Goal status: Achieved  3.  Pt. will improve L hand FMC/dexterity skills to be able to sign her name on checks with 75% legibility, using writing aids as needed. Baseline: 01/21/2023: 75% legibility, cursive 75% legibility 12/22/2022: 12/22/2023: Print: 50% legibility, cursive 50% legibility Eval: Print: 25% legibility, Cursive: 25% legibility Goal status: Achieved  4. Pt. will perform check writing tasks with 75% legibility.  Baseline: 02/18/2023: 50% legibility 01/21/2023: 50% legibility  Goal status: Ongoing  5. Pt. will independently compose a note card correspondence with 75%  legibility.  Baseline: 02/18/2023: 50% 01/21/2023: 50% legibility  Goal status: Ongoing ASSESSMENT:  CLINICAL IMPRESSION:  Pt. is improving with left hand [facility] skills grasping, and manipulating the small objects on the grooved pegboard. Pt. Presented with improved writing legibility for printed form. Pt. was able to turn the grooved pegs within the tips of her fingers. Pt. continues to drop multiple pegs when attempting translatory movements storing the pegs, and flat washers in the palm of her left hand. Pt. continues to present with limited bilateral [facility] skills which hinder engagement in ADL tasks, IADL tasks, and overall function. Pt. continues to benefit from OT services to improve LUE strength, and [facility] skills in order to improve writing legibility to increase independence and enhance engagement in ADL/IADL tasks.   PERFORMANCE DEFICITS: in functional skills including ADLs, IADLs, coordination, strength, and Fine motor control, cognitive skills including problem solving, and psychosocial skills including coping strategies, environmental adaptation, and routines and behaviors.   IMPAIRMENTS: are limiting patient from ADLs and IADLs.   CO-MORBIDITIES: may have co-morbidities  that affects occupational performance. Patient will benefit from skilled OT to address above impairments and improve overall function.  MODIFICATION OR ASSISTANCE TO COMPLETE EVALUATION: Min-Moderate modification of tasks or assist with assess necessary to complete an evaluation.  OT OCCUPATIONAL PROFILE AND HISTORY: Detailed assessment: Review of records and additional review of physical, cognitive, psychosocial history related to current functional performance.  CLINICAL DECISION MAKING: Moderate - several treatment options, min-mod task modification necessary  REHAB POTENTIAL: Good  EVALUATION COMPLEXITY: Moderate    PLAN:  OT FREQUENCY: 2x/week  OT DURATION: 12 weeks  PLANNED INTERVENTIONS: self care/ADL  training, therapeutic exercise, therapeutic activity, neuromuscular re-education, and patient/family education  RECOMMENDED OTHER SERVICES: N/A  CONSULTED AND AGREED WITH PLAN OF CARE: Patient  PLAN FOR NEXT SESSION: Initiate treatment  Olegario Messier, MS, OTR/L  03/04/23, 3:57 PM

## 2023-03-09 ENCOUNTER — Ambulatory Visit: Payer: Medicare Other | Admitting: Occupational Therapy

## 2023-03-11 ENCOUNTER — Ambulatory Visit: Payer: Medicare Other | Admitting: Occupational Therapy

## 2023-03-11 DIAGNOSIS — M6281 Muscle weakness (generalized): Secondary | ICD-10-CM

## 2023-03-11 DIAGNOSIS — R278 Other lack of coordination: Secondary | ICD-10-CM

## 2023-03-11 NOTE — Therapy (Signed)
Occupational Therapy  Neuro Treatment Note  Patient Name: Susan [name] MRN: [id number] DOB:Jul 08, 1926, 89 y.o., female Today's Date: 03/11/2023  PCP: Susan [name] REFERRING PROVIDER: Hillery [name]  END OF SESSION:  OT End of Session - 03/11/23 1455     Visit Number 25    Date for OT Re-Evaluation 04/15/23    OT Start Time 1400    OT Stop Time 1445    OT Time Calculation (min) 45 min    Equipment Utilized During Treatment Wheelchair    Activity Tolerance Patient tolerated treatment well    Behavior During Therapy [facility] for tasks assessed/performed            Past Medical History:  Diagnosis Date   Anemia    Balance problem    Diabetes mellitus without complication (HCC)    Diverticulosis    Heart murmur    Hypercholesteremia    Hypertension    Stroke [facility])    TIA 2006   TIA (transient ischemic attack) 2006   Past Surgical History:  Procedure Laterality Date   CATARACT EXTRACTION W/PHACO Left 05/22/2015   Procedure: CATARACT EXTRACTION PHACO AND INTRAOCULAR LENS PLACEMENT (IOC);  Surgeon: Susan Manila, MD;  Location: [facility] ORS;  Service: Ophthalmology;  Laterality: Left;  [place]: 01:07.9 AP%: 23.0 CDE: 15.62 Lot# [id number]   CATARACT EXTRACTION W/PHACO Right 06/12/2015   Procedure: CATARACT EXTRACTION PHACO AND INTRAOCULAR LENS PLACEMENT (IOC);  Surgeon: Susan Manila, MD;  Location: [facility] ORS;  Service: Ophthalmology;  Laterality: Right;  [place] 00:57 AP% 17.1 CDE 9.86 fluid pack lot # [id number]   COLON SURGERY     resection   COLONOSCOPY WITH PROPOFOL N/A 10/29/2021   Procedure: COLONOSCOPY WITH PROPOFOL;  Surgeon: Susan Bill, MD;  Location: [facility] ENDOSCOPY;  Service: Endoscopy;  Laterality: N/A;   ECTOPIC PREGNANCY SURGERY     HERNIA REPAIR     TONSILLECTOMY     Patient Active Problem List   Diagnosis Date Noted   Renal lesion, right 10/30/2021   Acute blood loss anemia 10/30/2021   Lower GI bleed 10/27/2021   GI bleeding 05/29/2021   CVA (cerebral vascular  accident) (HCC) 07/04/2017    ONSET DATE: 04/2018  REFERRING DIAG: CVA  THERAPY DIAG:  Muscle weakness (generalized)  Other lack of coordination  Rationale for Evaluation and Treatment: Rehabilitation  SUBJECTIVE:   SUBJECTIVE STATEMENT:  Pt. reports that she has been looking forward this Thanksgiving all year. Pt accompanied by: self  PERTINENT HISTORY: Pt. is a 89 y.o. female who has a past medical history of CVA, hypertension, type ll DM, chronic anemia, history of diverticular bleed in February 2023. Pt. Has had a recent fall on 02/22/2022 and was found laying on the floor and had been laying there for a couple of days. Pt. Presents with generalized weakness and was referred to OT by her PCP to help with decrease in handwriting skills over the last few years.  PRECAUTIONS: None   WEIGHT BEARING RESTRICTIONS: No  PAIN:  Are you having pain? No  FALLS: Has patient fallen in last 6 months? Yes. Number of falls 1-2  LIVING ENVIRONMENT: Lives with: lives alone Lives in: Independent living facility that has apartments called "[facility]" Stairs: Engineer, structural Has following equipment at home: Dan Humphreys - 2 wheeled, Environmental consultant - 4 wheeled, Wheelchair (power), shower chair, and Grab bars  PLOF: Independent  PATIENT GOALS: Desires to improve handwriting so that she is able to write her own checks  OBJECTIVE:   HAND DOMINANCE: Left  ADLs:  Eating: Independent with feeding self Grooming: Independent UB Dressing: Able to put blouse on, does not wear shirt with buttons LB Dressing: Able to dress self with elastic pants, able to put socks and shoes Toileting: Independent Bathing: Pt. Has someone who comes and gives her a shower Tub Shower transfers: Depends on Aid to help get in and out of the walk in shower Equipment: Shower seat with back, Grab bars, Walk in shower, and bed side commode  IADLs: Shopping: Grand daughter does the grocery shopping however Pt. Reports going with  her sometimes Light housekeeping: Pt. States she keeps a clean house and can vacuum from her wheelchair Meal Prep: Meals on wheels brings her food, grand daughter will also help prepare light meals Community mobility: Public transportation Medication management: Independent, uses pill Nature conservation officer: Granddaguther helps pay pills and writes checks Handwriting: Print 25% legible Cursive: 25% legible Pt. Was asked to write name on paper and instead of writing side to side Pt. wrote vertically  MOBILITY STATUS: Needs Assist: Wheelchair  POSTURE COMMENTS:  No Significant postural limitations Sitting balance: WFL  ACTIVITY TOLERANCE: Activity tolerance: WFL  FUNCTIONAL OUTCOME MEASURES: FOTO: 69 TR: 68  UPPER EXTREMITY ROM:  [facility]  UPPER EXTREMITY MMT:     MMT Right eval Right 12/22/22 Right 01/21/23 Left eval Left 12/22/22 Left 01/21/23 Left 02/18/23  Shoulder flexion 4+/5 4+/5 5/5 4/5 4+/5 4+/5 5/5  Shoulder abduction 4+/5 4+/5 5/5 4/5 4+/5 4+/5 5/5  Shoulder adduction         Shoulder extension         Shoulder internal rotation         Shoulder external rotation         Middle trapezius         Lower trapezius         Elbow flexion 5/5 5/5 5/5 4+/5 4+/5 5/5 5/5  Elbow extension 5/5 5/5 5/5 4+/5 4+/5 5/5 5/5  Wrist flexion 4+/5 4+/5 5/5 4/5 4+/5 4+/5 5/5  Wrist extension 4+/5 4+/5 5/5 4/5 4+/5 4+/5 5/5  Wrist ulnar deviation         Wrist radial deviation         Wrist pronation         Wrist supination         (Blank rows = not tested)  HAND FUNCTION: Grip strength: Right: 40 lbs; Left: 44 lbs, Lateral pinch: Right: 7 lbs, Left: 8 lbs, and 3 point pinch: Right: 6 lbs, Left: 7 lbs 12/22/2022: Right: 40 lbs; Left: 40 lbs, Lateral pinch: Right: 11 lbs, Left: 8 lbs, and 3 point pinch: Right: 6 lbs, Left: 5 lbs 01/21/2023: Right: 40 lbs; Left: 45 lbs, Lateral pinch: Right: 11 lbs, Left:17 lbs, and 3 point pinch: Right: 6 lbs, Left: 6 lbs 02/18/2023 Right:  37 lbs; Left: 45 lbs, Lateral pinch: Right: 7 lbs, Left: 20 lbs, and 3 point pinch: Right:10 lbs, Left: 6 lbs   COORDINATION: 9 Hole Peg test: Right: 1 minute 10 sec; Left: 58 sec 12/22/2022: 9 Hole Peg test: Right: 1 minute & 1 sec; Left: 53 sec 01/21/2023: 9 Hole Peg test: Right: 58 sec; Left: 58 sec 02/18/2023: 9 Hole Peg test: Right: 1 min. & 14 sec.; Left: 1 min& 18 sec.   SENSATION: Not tested  MUSCLE TONE: WFL  COGNITION: Overall cognitive status: Within functional limits for tasks assessed  VISION: Subjective report: Pt. reports that she wears glasses for most of the time however takes them  off sometimes. Baseline vision: Wear glasses most of the time Visual history: cataracts  PERCEPTION: WFL  PRAXIS: WFL   TODAY'S TREATMENT:                                                                                                                              DATE: 03/11/2023   Neuromuscular re-education:  Pt. worked on [facility] skills grasping 1" sticks, 1/4" collars. Pt. worked on storing the objects in the palm, and translatory skills moving the items from the palm of the hand to the tip of the 2nd digit, and thumb. Pt. worked on removing the pegs using bilateral alternating hand patterns.     PATIENT EDUCATION: Education details: FMC, UE thereapeutic ex Person educated: Patient Education method: Explanation, Demonstration, and Verbal cues Education comprehension: verbalized understanding, demonstrated understanding; further training needed  HOME EXERCISE PROGRAM: Handwriting practice   GOALS: Goals reviewed with patient? Yes  SHORT TERM GOALS: Target date: 01/08/2023  Pt. Will improve FOTO score by 2 points to reflect improved perceived function performance in specific ADL/IADL. Baseline: 02/18/2023: 61 12/22/2022: FOTO: 77 Eval: FOTO: 69 TR: 68 Goal status: MET  LONG TERM GOALS: Target date: 04/15/2023  Pt. will improve R and L hand FMC skills by 10 seconds of  speed to independently manipulate small objects during ADL/IADL tasks. Baseline: 02/18/2023:  9 Hole Peg test: Right: 1 min & 14sec; Left: 1 min& 18 sec. 01/21/2023: Right: 58 sec. & Left 58 sec. 12/22/2022:9 Hole Peg test: Right: 1 minute & 1 sec; Left: 53 sec. Eval: 9 Hole Peg test: Right: 1 minute 10 sec; Left: 58 sec Goal status: Ongoing  2.  Pt. will improve L shoulder strength by 1 mm grade to be able to sustain LUE elevation while performing ADL/IADL tasks. Baseline: 02/18/2023:  L: Shoulder flexion: 5/5, Shoulder abduction: 5/5 12/22/2022: L: Shoulder flexion: 4/5, Shoulder abduction: 4/5 Eval: L: Shoulder flexion: 4/5, Shoulder abduction: 4/5 01/21/2023: L: Shoulder flexion: 4+/5, Shoulder abduction: 4+/5 12/22/2022: L: Shoulder flexion: 4/5, Shoulder abduction: 4/5 Eval: L: Shoulder flexion: 4/5, Shoulder abduction: 4/5 Goal status: Achieved  3.  Pt. will improve L hand FMC/dexterity skills to be able to sign her name on checks with 75% legibility, using writing aids as needed. Baseline: 01/21/2023: 75% legibility, cursive 75% legibility 12/22/2022: 12/22/2023: Print: 50% legibility, cursive 50% legibility Eval: Print: 25% legibility, Cursive: 25% legibility Goal status: Achieved  4. Pt. will perform check writing tasks with 75% legibility.             Baseline: 02/18/2023: 50% legibility 01/21/2023: 50% legibility  Goal status: Ongoing  5. Pt. will independently compose a note card correspondence with 75% legibility.  Baseline: 02/18/2023: 50% 01/21/2023: 50% legibility  Goal status: Ongoing ASSESSMENT:  CLINICAL IMPRESSION:  Pt. reports that formulating lower case letters is difficult. Pt. is improving with left hand [facility] skills grasping, and manipulating the small objects. Pt. is able to perform translatory movements maneuvering one item  at a time into her palm, and then moving it from her palm to the tip of her fingers in preparation for placing it. Pt. presented with improved writing  legibility for printed form. Pt. was able to turn the grooved pegs within the tips of her fingers. Pt. continues to present with limited bilateral [facility] skills which hinder engagement in ADL tasks, IADL tasks, and overall function. Pt. continues to benefit from OT services to improve LUE strength, and [facility] skills in order to improve writing legibility to increase independence and enhance engagement in ADL/IADL tasks.   PERFORMANCE DEFICITS: in functional skills including ADLs, IADLs, coordination, strength, and Fine motor control, cognitive skills including problem solving, and psychosocial skills including coping strategies, environmental adaptation, and routines and behaviors.   IMPAIRMENTS: are limiting patient from ADLs and IADLs.   CO-MORBIDITIES: may have co-morbidities  that affects occupational performance. Patient will benefit from skilled OT to address above impairments and improve overall function.  MODIFICATION OR ASSISTANCE TO COMPLETE EVALUATION: Min-Moderate modification of tasks or assist with assess necessary to complete an evaluation.  OT OCCUPATIONAL PROFILE AND HISTORY: Detailed assessment: Review of records and additional review of physical, cognitive, psychosocial history related to current functional performance.  CLINICAL DECISION MAKING: Moderate - several treatment options, min-mod task modification necessary  REHAB POTENTIAL: Good  EVALUATION COMPLEXITY: Moderate    PLAN:  OT FREQUENCY: 2x/week  OT DURATION: 12 weeks  PLANNED INTERVENTIONS: self care/ADL training, therapeutic exercise, therapeutic activity, neuromuscular re-education, and patient/family education  RECOMMENDED OTHER SERVICES: N/A  CONSULTED AND AGREED WITH PLAN OF CARE: Patient  PLAN FOR NEXT SESSION: Initiate treatment  Olegario Messier, MS, OTR/L  03/11/23, 3:06 PM

## 2023-03-16 ENCOUNTER — Encounter: Payer: Self-pay | Admitting: Occupational Therapy

## 2023-03-16 ENCOUNTER — Ambulatory Visit: Payer: Medicare Other | Attending: Family Medicine | Admitting: Occupational Therapy

## 2023-03-16 DIAGNOSIS — M6281 Muscle weakness (generalized): Secondary | ICD-10-CM | POA: Diagnosis present

## 2023-03-16 DIAGNOSIS — R278 Other lack of coordination: Secondary | ICD-10-CM | POA: Diagnosis present

## 2023-03-16 NOTE — Therapy (Signed)
Occupational Therapy  Neuro Treatment Note  Patient Name: Susan [name] MRN: [id number] DOB:Aug 22, 1926, 89 y.o., female Today's Date: 03/16/2023  PCP: [name] REFERRING PROVIDER: [name]  END OF SESSION:  OT End of Session - 03/16/23 1354     Visit Number 26    Date for OT Re-Evaluation 04/15/23    OT Start Time 1400    OT Stop Time 1445    OT Time Calculation (min) 45 min    Equipment Utilized During Treatment Wheelchair    Activity Tolerance Patient tolerated treatment well    Behavior During Therapy [facility] for tasks assessed/performed            Past Medical History:  Diagnosis Date   Anemia    Balance problem    Diabetes mellitus without complication (HCC)    Diverticulosis    Heart murmur    Hypercholesteremia    Hypertension    Stroke [facility])    TIA 2006   TIA (transient ischemic attack) 2006   Past Surgical History:  Procedure Laterality Date   CATARACT EXTRACTION W/PHACO Left 05/22/2015   Procedure: CATARACT EXTRACTION PHACO AND INTRAOCULAR LENS PLACEMENT (IOC);  Surgeon: Galen Manila, MD;  Location: [facility] ORS;  Service: Ophthalmology;  Laterality: Left;  [place]: 01:07.9 AP%: 23.0 CDE: 15.62 Lot# [id number]   CATARACT EXTRACTION W/PHACO Right 06/12/2015   Procedure: CATARACT EXTRACTION PHACO AND INTRAOCULAR LENS PLACEMENT (IOC);  Surgeon: Galen Manila, MD;  Location: [facility] ORS;  Service: Ophthalmology;  Laterality: Right;  [place] 00:57 AP% 17.1 CDE 9.86 fluid pack lot # [id number]   COLON SURGERY     resection   COLONOSCOPY WITH PROPOFOL N/A 10/29/2021   Procedure: COLONOSCOPY WITH PROPOFOL;  Surgeon: Regis Bill, MD;  Location: [facility] ENDOSCOPY;  Service: Endoscopy;  Laterality: N/A;   ECTOPIC PREGNANCY SURGERY     HERNIA REPAIR     TONSILLECTOMY     Patient Active Problem List   Diagnosis Date Noted   Renal lesion, right 10/30/2021   Acute blood loss anemia 10/30/2021   Lower GI bleed 10/27/2021   GI bleeding 05/29/2021   CVA (cerebral vascular  accident) (HCC) 07/04/2017    ONSET DATE: 04/2018  REFERRING DIAG: CVA  THERAPY DIAG:  Other lack of coordination  Muscle weakness (generalized)  Rationale for Evaluation and Treatment: Rehabilitation  SUBJECTIVE:   SUBJECTIVE STATEMENT:  Pt. reports she had a great Thanksgiving visiting great great grand children. Pt accompanied by: self  PERTINENT HISTORY: Pt. is a 89 y.o. female who has a past medical history of CVA, hypertension, type ll DM, chronic anemia, history of diverticular bleed in February 2023. Pt. Has had a recent fall on 02/22/2022 and was found laying on the floor and had been laying there for a couple of days. Pt. Presents with generalized weakness and was referred to OT by her PCP to help with decrease in handwriting skills over the last few years.  PRECAUTIONS: None   WEIGHT BEARING RESTRICTIONS: No  PAIN:  Are you having pain? No  FALLS: Has patient fallen in last 6 months? Yes. Number of falls 1-2  LIVING ENVIRONMENT: Lives with: lives alone Lives in: Independent living facility that has apartments called "[facility]" Stairs: Engineer, structural Has following equipment at home: Dan Humphreys - 2 wheeled, Environmental consultant - 4 wheeled, Wheelchair (power), shower chair, and Grab bars  PLOF: Independent  PATIENT GOALS: Desires to improve handwriting so that she is able to write her own checks  OBJECTIVE:   HAND DOMINANCE: Left  ADLs:  Eating: Independent with feeding self Grooming: Independent UB Dressing: Able to put blouse on, does not wear shirt with buttons LB Dressing: Able to dress self with elastic pants, able to put socks and shoes Toileting: Independent Bathing: Pt. Has someone who comes and gives her a shower Tub Shower transfers: Depends on Aid to help get in and out of the walk in shower Equipment: Shower seat with back, Grab bars, Walk in shower, and bed side commode  IADLs: Shopping: Grand daughter does the grocery shopping however Pt. Reports going  with her sometimes Light housekeeping: Pt. States she keeps a clean house and can vacuum from her wheelchair Meal Prep: Meals on wheels brings her food, grand daughter will also help prepare light meals Community mobility: Public transportation Medication management: Independent, uses pill Nature conservation officer: Granddaguther helps pay pills and writes checks Handwriting: Print 25% legible Cursive: 25% legible Pt. Was asked to write name on paper and instead of writing side to side Pt. wrote vertically  MOBILITY STATUS: Needs Assist: Wheelchair  POSTURE COMMENTS:  No Significant postural limitations Sitting balance: WFL  ACTIVITY TOLERANCE: Activity tolerance: WFL  FUNCTIONAL OUTCOME MEASURES: FOTO: 69 TR: 68  UPPER EXTREMITY ROM:  [facility]  UPPER EXTREMITY MMT:     MMT Right eval Right 12/22/22 Right 01/21/23 Left eval Left 12/22/22 Left 01/21/23 Left 02/18/23  Shoulder flexion 4+/5 4+/5 5/5 4/5 4+/5 4+/5 5/5  Shoulder abduction 4+/5 4+/5 5/5 4/5 4+/5 4+/5 5/5  Shoulder adduction         Shoulder extension         Shoulder internal rotation         Shoulder external rotation         Middle trapezius         Lower trapezius         Elbow flexion 5/5 5/5 5/5 4+/5 4+/5 5/5 5/5  Elbow extension 5/5 5/5 5/5 4+/5 4+/5 5/5 5/5  Wrist flexion 4+/5 4+/5 5/5 4/5 4+/5 4+/5 5/5  Wrist extension 4+/5 4+/5 5/5 4/5 4+/5 4+/5 5/5  Wrist ulnar deviation         Wrist radial deviation         Wrist pronation         Wrist supination         (Blank rows = not tested)  HAND FUNCTION: Grip strength: Right: 40 lbs; Left: 44 lbs, Lateral pinch: Right: 7 lbs, Left: 8 lbs, and 3 point pinch: Right: 6 lbs, Left: 7 lbs 12/22/2022: Right: 40 lbs; Left: 40 lbs, Lateral pinch: Right: 11 lbs, Left: 8 lbs, and 3 point pinch: Right: 6 lbs, Left: 5 lbs 01/21/2023: Right: 40 lbs; Left: 45 lbs, Lateral pinch: Right: 11 lbs, Left:17 lbs, and 3 point pinch: Right: 6 lbs, Left: 6 lbs 02/18/2023  Right: 37 lbs; Left: 45 lbs, Lateral pinch: Right: 7 lbs, Left: 20 lbs, and 3 point pinch: Right:10 lbs, Left: 6 lbs   COORDINATION: 9 Hole Peg test: Right: 1 minute 10 sec; Left: 58 sec 12/22/2022: 9 Hole Peg test: Right: 1 minute & 1 sec; Left: 53 sec 01/21/2023: 9 Hole Peg test: Right: 58 sec; Left: 58 sec 02/18/2023: 9 Hole Peg test: Right: 1 min. & 14 sec.; Left: 1 min& 18 sec.   SENSATION: Not tested  MUSCLE TONE: WFL  COGNITION: Overall cognitive status: Within functional limits for tasks assessed  VISION: Subjective report: Pt. reports that she wears glasses for most of the time however takes them  off sometimes. Baseline vision: Wear glasses most of the time Visual history: cataracts  PERCEPTION: WFL  PRAXIS: WFL   TODAY'S TREATMENT:                                                                                                                              DATE: 03/16/2023   Neuromuscular re-education: Pt performed B FMC tasks using the Grooved pegboard. Pt worked on grasping the grooved pegs from a horizontal position and worked on Comptroller, moving the pegs to a vertical position in the hand to prepare for placing them in the grooved slot. Removing pegs with dominant L hand pt focused on storing pegs in palm without dropping - 1st trial stored 7; 2nd trial stored 13. Cues to minimize shoulder hiking. Increased time to place and remove pegs with non-dominant RUE.   Self Care:   Pt completed trail making test requiring 3'02" for part A and 4'26" for part B with 1 error. The trail making test assesses visual scanning, executive function, and attention. Requires increased time related to decreased visual scanning speed.  Pt copied words in print with 75% legibility decreasing to 50% as pt fatigued.     PATIENT EDUCATION: Education details: FMC, UE thereapeutic ex Person educated: Patient Education method: Explanation, Demonstration, and Verbal cues Education  comprehension: verbalized understanding, demonstrated understanding; further training needed  HOME EXERCISE PROGRAM: Handwriting practice   GOALS: Goals reviewed with patient? Yes  SHORT TERM GOALS: Target date: 01/08/2023  Pt. Will improve FOTO score by 2 points to reflect improved perceived function performance in specific ADL/IADL. Baseline: 02/18/2023: 61 12/22/2022: FOTO: 77 Eval: FOTO: 69 TR: 68 Goal status: MET  LONG TERM GOALS: Target date: 04/15/2023  Pt. will improve R and L hand FMC skills by 10 seconds of speed to independently manipulate small objects during ADL/IADL tasks. Baseline: 02/18/2023:  9 Hole Peg test: Right: 1 min & 14sec; Left: 1 min& 18 sec. 01/21/2023: Right: 58 sec. & Left 58 sec. 12/22/2022:9 Hole Peg test: Right: 1 minute & 1 sec; Left: 53 sec. Eval: 9 Hole Peg test: Right: 1 minute 10 sec; Left: 58 sec Goal status: Ongoing  2.  Pt. will improve L shoulder strength by 1 mm grade to be able to sustain LUE elevation while performing ADL/IADL tasks. Baseline: 02/18/2023:  L: Shoulder flexion: 5/5, Shoulder abduction: 5/5 12/22/2022: L: Shoulder flexion: 4/5, Shoulder abduction: 4/5 Eval: L: Shoulder flexion: 4/5, Shoulder abduction: 4/5 01/21/2023: L: Shoulder flexion: 4+/5, Shoulder abduction: 4+/5 12/22/2022: L: Shoulder flexion: 4/5, Shoulder abduction: 4/5 Eval: L: Shoulder flexion: 4/5, Shoulder abduction: 4/5 Goal status: Achieved  3.  Pt. will improve L hand FMC/dexterity skills to be able to sign her name on checks with 75% legibility, using writing aids as needed. Baseline: 01/21/2023: 75% legibility, cursive 75% legibility 12/22/2022: 12/22/2023: Print: 50% legibility, cursive 50% legibility Eval: Print: 25% legibility, Cursive: 25% legibility Goal status: Achieved  4. Pt. will perform check  writing tasks with 75% legibility.             Baseline: 02/18/2023: 50% legibility 01/21/2023: 50% legibility  Goal status: Ongoing  5. Pt. will independently  compose a note card correspondence with 75% legibility.  Baseline: 02/18/2023: 50% 01/21/2023: 50% legibility  Goal status: Ongoing ASSESSMENT:  CLINICAL IMPRESSION:  Pt is improving with left hand [facility] skills grasping, and manipulating the small objects, cues to avoid shoulder hiking. Initial 75% print legibility decreasing with fatigue. Completes trail making test with significantly increased time but only 1 error on part B. Pt continues to present with limited bilateral [facility] skills which hinder engagement in ADL tasks, IADL tasks, and overall function. Pt. continues to benefit from OT services to improve LUE strength, and [facility] skills in order to improve writing legibility to increase independence and enhance engagement in ADL/IADL tasks.   PERFORMANCE DEFICITS: in functional skills including ADLs, IADLs, coordination, strength, and Fine motor control, cognitive skills including problem solving, and psychosocial skills including coping strategies, environmental adaptation, and routines and behaviors.   IMPAIRMENTS: are limiting patient from ADLs and IADLs.   CO-MORBIDITIES: may have co-morbidities  that affects occupational performance. Patient will benefit from skilled OT to address above impairments and improve overall function.  MODIFICATION OR ASSISTANCE TO COMPLETE EVALUATION: Min-Moderate modification of tasks or assist with assess necessary to complete an evaluation.  OT OCCUPATIONAL PROFILE AND HISTORY: Detailed assessment: Review of records and additional review of physical, cognitive, psychosocial history related to current functional performance.  CLINICAL DECISION MAKING: Moderate - several treatment options, min-mod task modification necessary  REHAB POTENTIAL: Good  EVALUATION COMPLEXITY: Moderate    PLAN:  OT FREQUENCY: 2x/week  OT DURATION: 12 weeks  PLANNED INTERVENTIONS: self care/ADL training, therapeutic exercise, therapeutic activity, neuromuscular re-education,  and patient/family education  RECOMMENDED OTHER SERVICES: N/A  CONSULTED AND AGREED WITH PLAN OF CARE: Patient  PLAN FOR NEXT SESSION: Handwriting  Kathie Dike, M.S. OTR/L  03/16/23, 1:55 PM  ascom [phone]  03/16/23, 1:55 PM

## 2023-03-18 ENCOUNTER — Ambulatory Visit: Payer: Medicare Other | Admitting: Occupational Therapy

## 2023-03-18 ENCOUNTER — Encounter: Payer: Self-pay | Admitting: Occupational Therapy

## 2023-03-18 DIAGNOSIS — M6281 Muscle weakness (generalized): Secondary | ICD-10-CM

## 2023-03-18 DIAGNOSIS — R278 Other lack of coordination: Secondary | ICD-10-CM | POA: Diagnosis not present

## 2023-03-18 NOTE — Therapy (Signed)
Occupational Therapy  Neuro Treatment Note  Patient Name: Susan [name] MRN: [id number] DOB:08/26/1926, 89 y.o., female Today's Date: 03/18/2023  PCP: [name] REFERRING PROVIDER: [name]  END OF SESSION:  OT End of Session - 03/18/23 1350     Visit Number 27    Number of Visits 48    Date for OT Re-Evaluation 04/15/23    OT Start Time 1400    OT Stop Time 1445    OT Time Calculation (min) 45 min    Equipment Utilized During Treatment Wheelchair    Activity Tolerance Patient tolerated treatment well    Behavior During Therapy [facility] for tasks assessed/performed            Past Medical History:  Diagnosis Date   Anemia    Balance problem    Diabetes mellitus without complication (HCC)    Diverticulosis    Heart murmur    Hypercholesteremia    Hypertension    Stroke [facility])    TIA 2006   TIA (transient ischemic attack) 2006   Past Surgical History:  Procedure Laterality Date   CATARACT EXTRACTION W/PHACO Left 05/22/2015   Procedure: CATARACT EXTRACTION PHACO AND INTRAOCULAR LENS PLACEMENT (IOC);  Surgeon: Galen Manila, MD;  Location: [facility] ORS;  Service: Ophthalmology;  Laterality: Left;  [place]: 01:07.9 AP%: 23.0 CDE: 15.62 Lot# [id number]   CATARACT EXTRACTION W/PHACO Right 06/12/2015   Procedure: CATARACT EXTRACTION PHACO AND INTRAOCULAR LENS PLACEMENT (IOC);  Surgeon: Galen Manila, MD;  Location: [facility] ORS;  Service: Ophthalmology;  Laterality: Right;  [place] 00:57 AP% 17.1 CDE 9.86 fluid pack lot # [id number]   COLON SURGERY     resection   COLONOSCOPY WITH PROPOFOL N/A 10/29/2021   Procedure: COLONOSCOPY WITH PROPOFOL;  Surgeon: Regis Bill, MD;  Location: [facility] ENDOSCOPY;  Service: Endoscopy;  Laterality: N/A;   ECTOPIC PREGNANCY SURGERY     HERNIA REPAIR     TONSILLECTOMY     Patient Active Problem List   Diagnosis Date Noted   Renal lesion, right 10/30/2021   Acute blood loss anemia 10/30/2021   Lower GI bleed 10/27/2021   GI bleeding 05/29/2021    CVA (cerebral vascular accident) (HCC) 07/04/2017    ONSET DATE: 04/2018  REFERRING DIAG: CVA  THERAPY DIAG:  Other lack of coordination  Muscle weakness (generalized)  Rationale for Evaluation and Treatment: Rehabilitation  SUBJECTIVE:   SUBJECTIVE STATEMENT:  Pt. reports she had a great Thanksgiving visiting great great grand children. Pt accompanied by: self  PERTINENT HISTORY: Pt. is a 89 y.o. female who has a past medical history of CVA, hypertension, type ll DM, chronic anemia, history of diverticular bleed in February 2023. Pt. Has had a recent fall on 02/22/2022 and was found laying on the floor and had been laying there for a couple of days. Pt. Presents with generalized weakness and was referred to OT by her PCP to help with decrease in handwriting skills over the last few years.  PRECAUTIONS: None   WEIGHT BEARING RESTRICTIONS: No  PAIN:  Are you having pain? No  FALLS: Has patient fallen in last 6 months? Yes. Number of falls 1-2  LIVING ENVIRONMENT: Lives with: lives alone Lives in: Independent living facility that has apartments called "[facility]" Stairs: Engineer, structural Has following equipment at home: Dan Humphreys - 2 wheeled, Environmental consultant - 4 wheeled, Wheelchair (power), shower chair, and Grab bars  PLOF: Independent  PATIENT GOALS: Desires to improve handwriting so that she is able to write her own checks  OBJECTIVE:   HAND DOMINANCE: Left  ADLs:  Eating: Independent with feeding self Grooming: Independent UB Dressing: Able to put blouse on, does not wear shirt with buttons LB Dressing: Able to dress self with elastic pants, able to put socks and shoes Toileting: Independent Bathing: Pt. Has someone who comes and gives her a shower Tub Shower transfers: Depends on Aid to help get in and out of the walk in shower Equipment: Shower seat with back, Grab bars, Walk in shower, and bed side commode  IADLs: Shopping: Grand daughter does the grocery shopping  however Pt. Reports going with her sometimes Light housekeeping: Pt. States she keeps a clean house and can vacuum from her wheelchair Meal Prep: Meals on wheels brings her food, grand daughter will also help prepare light meals Community mobility: Public transportation Medication management: Independent, uses pill Nature conservation officer: Granddaguther helps pay pills and writes checks Handwriting: Print 25% legible Cursive: 25% legible Pt. Was asked to write name on paper and instead of writing side to side Pt. wrote vertically  MOBILITY STATUS: Needs Assist: Wheelchair  POSTURE COMMENTS:  No Significant postural limitations Sitting balance: WFL  ACTIVITY TOLERANCE: Activity tolerance: WFL  FUNCTIONAL OUTCOME MEASURES: FOTO: 69 TR: 68  UPPER EXTREMITY ROM:  [facility]  UPPER EXTREMITY MMT:     MMT Right eval Right 12/22/22 Right 01/21/23 Left eval Left 12/22/22 Left 01/21/23 Left 02/18/23  Shoulder flexion 4+/5 4+/5 5/5 4/5 4+/5 4+/5 5/5  Shoulder abduction 4+/5 4+/5 5/5 4/5 4+/5 4+/5 5/5  Shoulder adduction         Shoulder extension         Shoulder internal rotation         Shoulder external rotation         Middle trapezius         Lower trapezius         Elbow flexion 5/5 5/5 5/5 4+/5 4+/5 5/5 5/5  Elbow extension 5/5 5/5 5/5 4+/5 4+/5 5/5 5/5  Wrist flexion 4+/5 4+/5 5/5 4/5 4+/5 4+/5 5/5  Wrist extension 4+/5 4+/5 5/5 4/5 4+/5 4+/5 5/5  Wrist ulnar deviation         Wrist radial deviation         Wrist pronation         Wrist supination         (Blank rows = not tested)  HAND FUNCTION: Grip strength: Right: 40 lbs; Left: 44 lbs, Lateral pinch: Right: 7 lbs, Left: 8 lbs, and 3 point pinch: Right: 6 lbs, Left: 7 lbs 12/22/2022: Right: 40 lbs; Left: 40 lbs, Lateral pinch: Right: 11 lbs, Left: 8 lbs, and 3 point pinch: Right: 6 lbs, Left: 5 lbs 01/21/2023: Right: 40 lbs; Left: 45 lbs, Lateral pinch: Right: 11 lbs, Left:17 lbs, and 3 point pinch: Right: 6 lbs,  Left: 6 lbs 02/18/2023 Right: 37 lbs; Left: 45 lbs, Lateral pinch: Right: 7 lbs, Left: 20 lbs, and 3 point pinch: Right:10 lbs, Left: 6 lbs   COORDINATION: 9 Hole Peg test: Right: 1 minute 10 sec; Left: 58 sec 12/22/2022: 9 Hole Peg test: Right: 1 minute & 1 sec; Left: 53 sec 01/21/2023: 9 Hole Peg test: Right: 58 sec; Left: 58 sec 02/18/2023: 9 Hole Peg test: Right: 1 min. & 14 sec.; Left: 1 min& 18 sec.   SENSATION: Not tested  MUSCLE TONE: WFL  COGNITION: Overall cognitive status: Within functional limits for tasks assessed  VISION: Subjective report: Pt. reports that she wears glasses for  most of the time however takes them off sometimes. Baseline vision: Wear glasses most of the time Visual history: cataracts  PERCEPTION: WFL  PRAXIS: WFL   TODAY'S TREATMENT:                                                                                                                              DATE: 03/18/2023   Therapeutic Activity: Pt performed B FMC tasks picking up washers from tabletop and placing onto dowel, required bringing washers to edge of table to pick up. Increased time to complete with non-dominant R hand. Removed scrabble tiles from tupperware and focused on in hand manipulation to drop 1 at a time onto table.   Self Care:   Pt copied 9 words in print with 75% and 9 words in cursive with 50% legibility. Continues to demonstrate increased difficulty with lowercase letters. Wrote alphabet in upper case with 80% legibility - improved legibility on paper close to patient - educated on keeping paper close to body.     PATIENT EDUCATION: Education details: FMC, UE thereapeutic ex Person educated: Patient Education method: Explanation, Demonstration, and Verbal cues Education comprehension: verbalized understanding, demonstrated understanding; further training needed  HOME EXERCISE PROGRAM: Handwriting practice   GOALS: Goals reviewed with patient? Yes  SHORT TERM  GOALS: Target date: 01/08/2023  Pt. Will improve FOTO score by 2 points to reflect improved perceived function performance in specific ADL/IADL. Baseline: 02/18/2023: 61 12/22/2022: FOTO: 77 Eval: FOTO: 69 TR: 68 Goal status: MET  LONG TERM GOALS: Target date: 04/15/2023  Pt. will improve R and L hand FMC skills by 10 seconds of speed to independently manipulate small objects during ADL/IADL tasks. Baseline: 02/18/2023:  9 Hole Peg test: Right: 1 min & 14sec; Left: 1 min& 18 sec. 01/21/2023: Right: 58 sec. & Left 58 sec. 12/22/2022:9 Hole Peg test: Right: 1 minute & 1 sec; Left: 53 sec. Eval: 9 Hole Peg test: Right: 1 minute 10 sec; Left: 58 sec Goal status: Ongoing  2.  Pt. will improve L shoulder strength by 1 mm grade to be able to sustain LUE elevation while performing ADL/IADL tasks. Baseline: 02/18/2023:  L: Shoulder flexion: 5/5, Shoulder abduction: 5/5 12/22/2022: L: Shoulder flexion: 4/5, Shoulder abduction: 4/5 Eval: L: Shoulder flexion: 4/5, Shoulder abduction: 4/5 01/21/2023: L: Shoulder flexion: 4+/5, Shoulder abduction: 4+/5 12/22/2022: L: Shoulder flexion: 4/5, Shoulder abduction: 4/5 Eval: L: Shoulder flexion: 4/5, Shoulder abduction: 4/5 Goal status: Achieved  3.  Pt. will improve L hand FMC/dexterity skills to be able to sign her name on checks with 75% legibility, using writing aids as needed. Baseline: 01/21/2023: 75% legibility, cursive 75% legibility 12/22/2022: 12/22/2023: Print: 50% legibility, cursive 50% legibility Eval: Print: 25% legibility, Cursive: 25% legibility Goal status: Achieved  4. Pt. will perform check writing tasks with 75% legibility.             Baseline: 02/18/2023: 50% legibility 01/21/2023: 50% legibility  Goal status: Ongoing  5.  Pt. will independently compose a note card correspondence with 75% legibility.  Baseline: 02/18/2023: 50% 01/21/2023: 50% legibility  Goal status: Ongoing ASSESSMENT:  CLINICAL IMPRESSION:  Pt is improving with left hand  [facility] skills grasping and manipulating small objects, cues to avoid shoulder hiking. Improved legibility with print (75%) over cursive (50%). Improved legibility on paper close to patient - educated on keeping paper close to body. Pt continues to present with limited bilateral [facility] skills which hinder engagement in ADL tasks, IADL tasks, and overall function. Pt. continues to benefit from OT services to improve LUE strength, and [facility] skills in order to improve writing legibility to increase independence and enhance engagement in ADL/IADL tasks.   PERFORMANCE DEFICITS: in functional skills including ADLs, IADLs, coordination, strength, and Fine motor control, cognitive skills including problem solving, and psychosocial skills including coping strategies, environmental adaptation, and routines and behaviors.   IMPAIRMENTS: are limiting patient from ADLs and IADLs.   CO-MORBIDITIES: may have co-morbidities  that affects occupational performance. Patient will benefit from skilled OT to address above impairments and improve overall function.  MODIFICATION OR ASSISTANCE TO COMPLETE EVALUATION: Min-Moderate modification of tasks or assist with assess necessary to complete an evaluation.  OT OCCUPATIONAL PROFILE AND HISTORY: Detailed assessment: Review of records and additional review of physical, cognitive, psychosocial history related to current functional performance.  CLINICAL DECISION MAKING: Moderate - several treatment options, min-mod task modification necessary  REHAB POTENTIAL: Good  EVALUATION COMPLEXITY: Moderate    PLAN:  OT FREQUENCY: 2x/week  OT DURATION: 12 weeks  PLANNED INTERVENTIONS: self care/ADL training, therapeutic exercise, therapeutic activity, neuromuscular re-education, and patient/family education  RECOMMENDED OTHER SERVICES: N/A  CONSULTED AND AGREED WITH PLAN OF CARE: Patient  PLAN FOR NEXT SESSION: Handwriting  Kathie Dike, M.S. OTR/L  03/18/23, 1:50 PM  ascom  [phone]  03/18/23, 1:50 PM

## 2023-03-23 ENCOUNTER — Ambulatory Visit: Payer: Medicare Other | Admitting: Occupational Therapy

## 2023-03-23 ENCOUNTER — Telehealth: Payer: Self-pay | Admitting: Occupational Therapy

## 2023-03-25 ENCOUNTER — Ambulatory Visit: Payer: Medicare Other | Admitting: Occupational Therapy

## 2023-03-30 ENCOUNTER — Ambulatory Visit: Payer: Medicare Other | Admitting: Occupational Therapy

## 2023-03-30 DIAGNOSIS — R278 Other lack of coordination: Secondary | ICD-10-CM

## 2023-03-30 DIAGNOSIS — M6281 Muscle weakness (generalized): Secondary | ICD-10-CM

## 2023-03-30 NOTE — Therapy (Signed)
Occupational Therapy  Neuro Treatment Note  Patient Name: Susan [name] MRN: [id number] DOB:01/09/1927, 89 y.o., female Today's Date: 03/30/2023  PCP: [name] REFERRING PROVIDER: [name]  END OF SESSION:  OT End of Session - 03/30/23 1412     Visit Number 28    Number of Visits 48    Date for OT Re-Evaluation 04/15/23    OT Start Time 1400    OT Stop Time 1445    OT Time Calculation (min) 45 min    Activity Tolerance Patient tolerated treatment well    Behavior During Therapy [facility] for tasks assessed/performed            Past Medical History:  Diagnosis Date   Anemia    Balance problem    Diabetes mellitus without complication (HCC)    Diverticulosis    Heart murmur    Hypercholesteremia    Hypertension    Stroke [facility])    TIA 2006   TIA (transient ischemic attack) 2006   Past Surgical History:  Procedure Laterality Date   CATARACT EXTRACTION W/PHACO Left 05/22/2015   Procedure: CATARACT EXTRACTION PHACO AND INTRAOCULAR LENS PLACEMENT (IOC);  Surgeon: Galen Manila, MD;  Location: [facility] ORS;  Service: Ophthalmology;  Laterality: Left;  [place]: 01:07.9 AP%: 23.0 CDE: 15.62 Lot# [id number]   CATARACT EXTRACTION W/PHACO Right 06/12/2015   Procedure: CATARACT EXTRACTION PHACO AND INTRAOCULAR LENS PLACEMENT (IOC);  Surgeon: Galen Manila, MD;  Location: [facility] ORS;  Service: Ophthalmology;  Laterality: Right;  [place] 00:57 AP% 17.1 CDE 9.86 fluid pack lot # [id number]   COLON SURGERY     resection   COLONOSCOPY WITH PROPOFOL N/A 10/29/2021   Procedure: COLONOSCOPY WITH PROPOFOL;  Surgeon: Regis Bill, MD;  Location: [facility] ENDOSCOPY;  Service: Endoscopy;  Laterality: N/A;   ECTOPIC PREGNANCY SURGERY     HERNIA REPAIR     TONSILLECTOMY     Patient Active Problem List   Diagnosis Date Noted   Renal lesion, right 10/30/2021   Acute blood loss anemia 10/30/2021   Lower GI bleed 10/27/2021   GI bleeding 05/29/2021   CVA (cerebral vascular accident) (HCC) 07/04/2017     ONSET DATE: 04/2018  REFERRING DIAG: CVA  THERAPY DIAG:  Muscle weakness (generalized)  Other lack of coordination  Rationale for Evaluation and Treatment: Rehabilitation  SUBJECTIVE:   SUBJECTIVE STATEMENT:  Pt. reports she had a great Thanksgiving visiting great great grand children. Pt accompanied by: self  PERTINENT HISTORY: Pt. is a 89 y.o. female who has a past medical history of CVA, hypertension, type ll DM, chronic anemia, history of diverticular bleed in February 2023. Pt. Has had a recent fall on 02/22/2022 and was found laying on the floor and had been laying there for a couple of days. Pt. Presents with generalized weakness and was referred to OT by her PCP to help with decrease in handwriting skills over the last few years.  PRECAUTIONS: None   WEIGHT BEARING RESTRICTIONS: No  PAIN:  Are you having pain? No  FALLS: Has patient fallen in last 6 months? Yes. Number of falls 1-2  LIVING ENVIRONMENT: Lives with: lives alone Lives in: Independent living facility that has apartments called "[facility]" Stairs: Engineer, structural Has following equipment at home: Dan Humphreys - 2 wheeled, Environmental consultant - 4 wheeled, Wheelchair (power), shower chair, and Grab bars  PLOF: Independent  PATIENT GOALS: Desires to improve handwriting so that she is able to write her own checks  OBJECTIVE:   HAND DOMINANCE: Left  ADLs:  Eating: Independent with feeding self Grooming: Independent UB Dressing: Able to put blouse on, does not wear shirt with buttons LB Dressing: Able to dress self with elastic pants, able to put socks and shoes Toileting: Independent Bathing: Pt. Has someone who comes and gives her a shower Tub Shower transfers: Depends on Aid to help get in and out of the walk in shower Equipment: Shower seat with back, Grab bars, Walk in shower, and bed side commode  IADLs: Shopping: Grand daughter does the grocery shopping however Pt. Reports going with her sometimes Light  housekeeping: Pt. States she keeps a clean house and can vacuum from her wheelchair Meal Prep: Meals on wheels brings her food, grand daughter will also help prepare light meals Community mobility: Public transportation Medication management: Independent, uses pill Nature conservation officer: Granddaguther helps pay pills and writes checks Handwriting: Print 25% legible Cursive: 25% legible Pt. Was asked to write name on paper and instead of writing side to side Pt. wrote vertically  MOBILITY STATUS: Needs Assist: Wheelchair  POSTURE COMMENTS:  No Significant postural limitations Sitting balance: WFL  ACTIVITY TOLERANCE: Activity tolerance: WFL  FUNCTIONAL OUTCOME MEASURES: FOTO: 69 TR: 68  UPPER EXTREMITY ROM:  [facility]  UPPER EXTREMITY MMT:     MMT Right eval Right 12/22/22 Right 01/21/23 Left eval Left 12/22/22 Left 01/21/23 Left 02/18/23  Shoulder flexion 4+/5 4+/5 5/5 4/5 4+/5 4+/5 5/5  Shoulder abduction 4+/5 4+/5 5/5 4/5 4+/5 4+/5 5/5  Shoulder adduction         Shoulder extension         Shoulder internal rotation         Shoulder external rotation         Middle trapezius         Lower trapezius         Elbow flexion 5/5 5/5 5/5 4+/5 4+/5 5/5 5/5  Elbow extension 5/5 5/5 5/5 4+/5 4+/5 5/5 5/5  Wrist flexion 4+/5 4+/5 5/5 4/5 4+/5 4+/5 5/5  Wrist extension 4+/5 4+/5 5/5 4/5 4+/5 4+/5 5/5  Wrist ulnar deviation         Wrist radial deviation         Wrist pronation         Wrist supination         (Blank rows = not tested)  HAND FUNCTION: Grip strength: Right: 40 lbs; Left: 44 lbs, Lateral pinch: Right: 7 lbs, Left: 8 lbs, and 3 point pinch: Right: 6 lbs, Left: 7 lbs 12/22/2022: Right: 40 lbs; Left: 40 lbs, Lateral pinch: Right: 11 lbs, Left: 8 lbs, and 3 point pinch: Right: 6 lbs, Left: 5 lbs 01/21/2023: Right: 40 lbs; Left: 45 lbs, Lateral pinch: Right: 11 lbs, Left:17 lbs, and 3 point pinch: Right: 6 lbs, Left: 6 lbs 02/18/2023 Right: 37 lbs; Left: 45 lbs,  Lateral pinch: Right: 7 lbs, Left: 20 lbs, and 3 point pinch: Right:10 lbs, Left: 6 lbs   COORDINATION: 9 Hole Peg test: Right: 1 minute 10 sec; Left: 58 sec 12/22/2022: 9 Hole Peg test: Right: 1 minute & 1 sec; Left: 53 sec 01/21/2023: 9 Hole Peg test: Right: 58 sec; Left: 58 sec 02/18/2023: 9 Hole Peg test: Right: 1 min. & 14 sec.; Left: 1 min& 18 sec.   SENSATION: Not tested  MUSCLE TONE: WFL  COGNITION: Overall cognitive status: Within functional limits for tasks assessed  VISION: Subjective report: Pt. reports that she wears glasses for most of the time however takes them off sometimes.  Baseline vision: Wear glasses most of the time Visual history: cataracts  PERCEPTION: WFL  PRAXIS: WFL   TODAY'S TREATMENT:                                                                                                                              DATE: 03/30/2023   Neuromuscular re-education:  Pt. worked on left hand [facility] skills grasping 1" sticks, 1/4" collars, and 1/4" washers. Pt. worked on storing one object at a time in the palm, and translatory skills moving the items from the palm of the hand to the tip of the 2nd digit, and thumb. Pt. worked on removing the pegs using bilateral alternating hand patterns.   PATIENT EDUCATION: Education details: FMC, UE thereapeutic ex Person educated: Patient Education method: Explanation, Demonstration, and Verbal cues Education comprehension: verbalized understanding, demonstrated understanding; further training needed  HOME EXERCISE PROGRAM: Handwriting practice   GOALS: Goals reviewed with patient? Yes  SHORT TERM GOALS: Target date: 01/08/2023  Pt. Will improve FOTO score by 2 points to reflect improved perceived function performance in specific ADL/IADL. Baseline: 02/18/2023: 61 12/22/2022: FOTO: 77 Eval: FOTO: 69 TR: 68 Goal status: MET  LONG TERM GOALS: Target date: 04/15/2023  Pt. will improve R and L hand FMC skills by 10  seconds of speed to independently manipulate small objects during ADL/IADL tasks. Baseline: 02/18/2023:  9 Hole Peg test: Right: 1 min & 14sec; Left: 1 min& 18 sec. 01/21/2023: Right: 58 sec. & Left 58 sec. 12/22/2022:9 Hole Peg test: Right: 1 minute & 1 sec; Left: 53 sec. Eval: 9 Hole Peg test: Right: 1 minute 10 sec; Left: 58 sec Goal status: Ongoing  2.  Pt. will improve L shoulder strength by 1 mm grade to be able to sustain LUE elevation while performing ADL/IADL tasks. Baseline: 02/18/2023:  L: Shoulder flexion: 5/5, Shoulder abduction: 5/5 12/22/2022: L: Shoulder flexion: 4/5, Shoulder abduction: 4/5 Eval: L: Shoulder flexion: 4/5, Shoulder abduction: 4/5 01/21/2023: L: Shoulder flexion: 4+/5, Shoulder abduction: 4+/5 12/22/2022: L: Shoulder flexion: 4/5, Shoulder abduction: 4/5 Eval: L: Shoulder flexion: 4/5, Shoulder abduction: 4/5 Goal status: Achieved  3.  Pt. will improve L hand FMC/dexterity skills to be able to sign her name on checks with 75% legibility, using writing aids as needed. Baseline: 01/21/2023: 75% legibility, cursive 75% legibility 12/22/2022: 12/22/2023: Print: 50% legibility, cursive 50% legibility Eval: Print: 25% legibility, Cursive: 25% legibility Goal status: Achieved  4. Pt. will perform check writing tasks with 75% legibility.             Baseline: 02/18/2023: 50% legibility 01/21/2023: 50% legibility  Goal status: Ongoing  5. Pt. will independently compose a note card correspondence with 75% legibility.  Baseline: 02/18/2023: 50% 01/21/2023: 50% legibility  Goal status: Ongoing ASSESSMENT:  CLINICAL IMPRESSION:  Pt. received approval for additional visits from her insurance company. Pt continues to present with limited bilateral [facility] skills which hinder engagement in ADL tasks, IADL tasks, and overall  function. When using the left hand, Pt. dropped several of the 1" sticks when attempting to pick them up from the dish, and store them in the palm of her hand, and  when attempting to move them from the palm to the tip of her 2nd digit, and thumb. Pt. Requires increased time, and consistent cues to complete the bilateral alternating hand movements. Pt. continues to benefit from OT services to improve LUE strength, and [facility] skills in order to improve writing legibility to increase independence and enhance engagement in ADL/IADL tasks.   PERFORMANCE DEFICITS: in functional skills including ADLs, IADLs, coordination, strength, and Fine motor control, cognitive skills including problem solving, and psychosocial skills including coping strategies, environmental adaptation, and routines and behaviors.   IMPAIRMENTS: are limiting patient from ADLs and IADLs.   CO-MORBIDITIES: may have co-morbidities  that affects occupational performance. Patient will benefit from skilled OT to address above impairments and improve overall function.  MODIFICATION OR ASSISTANCE TO COMPLETE EVALUATION: Min-Moderate modification of tasks or assist with assess necessary to complete an evaluation.  OT OCCUPATIONAL PROFILE AND HISTORY: Detailed assessment: Review of records and additional review of physical, cognitive, psychosocial history related to current functional performance.  CLINICAL DECISION MAKING: Moderate - several treatment options, min-mod task modification necessary  REHAB POTENTIAL: Good  EVALUATION COMPLEXITY: Moderate    PLAN:  OT FREQUENCY: 2x/week  OT DURATION: 12 weeks  PLANNED INTERVENTIONS: self care/ADL training, therapeutic exercise, therapeutic activity, neuromuscular re-education, and patient/family education  RECOMMENDED OTHER SERVICES: N/A  CONSULTED AND AGREED WITH PLAN OF CARE: Patient  PLAN FOR NEXT SESSION: Handwriting  Olegario Messier, MS, OTR/L  03/30/23, 2:14 PM

## 2023-04-01 ENCOUNTER — Ambulatory Visit: Payer: Medicare Other | Admitting: Occupational Therapy

## 2023-04-01 DIAGNOSIS — R278 Other lack of coordination: Secondary | ICD-10-CM | POA: Diagnosis not present

## 2023-04-01 DIAGNOSIS — M6281 Muscle weakness (generalized): Secondary | ICD-10-CM

## 2023-04-01 NOTE — Therapy (Signed)
Occupational Therapy  Neuro Treatment Note  Patient Name: Susan [name] MRN: [id number] DOB:[birth year], 89 y.o., female Today's Date: 04/01/2023  PCP: [name] REFERRING PROVIDER: [name]  END OF SESSION:  OT End of Session - 04/01/23 1712     Visit Number 29    Number of Visits 48    Date for OT Re-Evaluation 04/15/23    OT Start Time 1400    OT Stop Time 1445    OT Time Calculation (min) 45 min    Activity Tolerance Patient tolerated treatment well    Behavior During Therapy [facility] for tasks assessed/performed            Past Medical History:  Diagnosis Date   Anemia    Balance problem    Diabetes mellitus without complication (HCC)    Diverticulosis    Heart murmur    Hypercholesteremia    Hypertension    Stroke [facility])    TIA 2006   TIA (transient ischemic attack) 2006   Past Surgical History:  Procedure Laterality Date   CATARACT EXTRACTION W/PHACO Left 05/22/2015   Procedure: CATARACT EXTRACTION PHACO AND INTRAOCULAR LENS PLACEMENT (IOC);  Surgeon: Galen Manila, MD;  Location: [facility] ORS;  Service: Ophthalmology;  Laterality: Left;  [place]: 01:07.9 AP%: 23.0 CDE: 15.62 Lot# [id number]   CATARACT EXTRACTION W/PHACO Right 06/12/2015   Procedure: CATARACT EXTRACTION PHACO AND INTRAOCULAR LENS PLACEMENT (IOC);  Surgeon: Galen Manila, MD;  Location: [facility] ORS;  Service: Ophthalmology;  Laterality: Right;  [place] 00:57 AP% 17.1 CDE 9.86 fluid pack lot # [id number]   COLON SURGERY     resection   COLONOSCOPY WITH PROPOFOL N/A 10/29/2021   Procedure: COLONOSCOPY WITH PROPOFOL;  Surgeon: Regis Bill, MD;  Location: [facility] ENDOSCOPY;  Service: Endoscopy;  Laterality: N/A;   ECTOPIC PREGNANCY SURGERY     HERNIA REPAIR     TONSILLECTOMY     Patient Active Problem List   Diagnosis Date Noted   Renal lesion, right 10/30/2021   Acute blood loss anemia 10/30/2021   Lower GI bleed 10/27/2021   GI bleeding 05/29/2021   CVA (cerebral vascular accident) (HCC) 07/04/2017     ONSET DATE: 04/2018  REFERRING DIAG: CVA  THERAPY DIAG:  Muscle weakness (generalized)  Other lack of coordination  Rationale for Evaluation and Treatment: Rehabilitation  SUBJECTIVE:   SUBJECTIVE STATEMENT:  Pt. reports that she is looking forward to spending the Christmas Holiday with her family, and has been waiting all year for it.  Pt accompanied by: self  PERTINENT HISTORY: Pt. is a 89 y.o. female who has a past medical history of CVA, hypertension, type ll DM, chronic anemia, history of diverticular bleed in February 2023. Pt. Has had a recent fall on 02/22/2022 and was found laying on the floor and had been laying there for a couple of days. Pt. Presents with generalized weakness and was referred to OT by her PCP to help with decrease in handwriting skills over the last few years.  PRECAUTIONS: None   WEIGHT BEARING RESTRICTIONS: No  PAIN:  Are you having pain? No  FALLS: Has patient fallen in last 6 months? Yes. Number of falls 1-2  LIVING ENVIRONMENT: Lives with: lives alone Lives in: Independent living facility that has apartments called "[facility]" Stairs: Engineer, structural Has following equipment at home: Dan Humphreys - 2 wheeled, Environmental consultant - 4 wheeled, Wheelchair (power), shower chair, and Grab bars  PLOF: Independent  PATIENT GOALS: Desires to improve handwriting so that she is able to write  her own checks  OBJECTIVE:   HAND DOMINANCE: Left  ADLs:  Eating: Independent with feeding self Grooming: Independent UB Dressing: Able to put blouse on, does not wear shirt with buttons LB Dressing: Able to dress self with elastic pants, able to put socks and shoes Toileting: Independent Bathing: Pt. Has someone who comes and gives her a shower Tub Shower transfers: Depends on Aid to help get in and out of the walk in shower Equipment: Shower seat with back, Grab bars, Walk in shower, and bed side commode  IADLs: Shopping: Grand daughter does the grocery shopping  however Pt. Reports going with her sometimes Light housekeeping: Pt. States she keeps a clean house and can vacuum from her wheelchair Meal Prep: Meals on wheels brings her food, grand daughter will also help prepare light meals Community mobility: Public transportation Medication management: Independent, uses pill Nature conservation officer: Granddaguther helps pay pills and writes checks Handwriting: Print 25% legible Cursive: 25% legible Pt. Was asked to write name on paper and instead of writing side to side Pt. wrote vertically  MOBILITY STATUS: Needs Assist: Wheelchair  POSTURE COMMENTS:  No Significant postural limitations Sitting balance: WFL  ACTIVITY TOLERANCE: Activity tolerance: WFL  FUNCTIONAL OUTCOME MEASURES: FOTO: 69 TR: 68  UPPER EXTREMITY ROM:  [facility]  UPPER EXTREMITY MMT:     MMT Right eval Right 12/22/22 Right 01/21/23 Left eval Left 12/22/22 Left 01/21/23 Left 02/18/23  Shoulder flexion 4+/5 4+/5 5/5 4/5 4+/5 4+/5 5/5  Shoulder abduction 4+/5 4+/5 5/5 4/5 4+/5 4+/5 5/5  Shoulder adduction         Shoulder extension         Shoulder internal rotation         Shoulder external rotation         Middle trapezius         Lower trapezius         Elbow flexion 5/5 5/5 5/5 4+/5 4+/5 5/5 5/5  Elbow extension 5/5 5/5 5/5 4+/5 4+/5 5/5 5/5  Wrist flexion 4+/5 4+/5 5/5 4/5 4+/5 4+/5 5/5  Wrist extension 4+/5 4+/5 5/5 4/5 4+/5 4+/5 5/5  Wrist ulnar deviation         Wrist radial deviation         Wrist pronation         Wrist supination         (Blank rows = not tested)  HAND FUNCTION: Grip strength: Right: 40 lbs; Left: 44 lbs, Lateral pinch: Right: 7 lbs, Left: 8 lbs, and 3 point pinch: Right: 6 lbs, Left: 7 lbs 12/22/2022: Right: 40 lbs; Left: 40 lbs, Lateral pinch: Right: 11 lbs, Left: 8 lbs, and 3 point pinch: Right: 6 lbs, Left: 5 lbs 01/21/2023: Right: 40 lbs; Left: 45 lbs, Lateral pinch: Right: 11 lbs, Left:17 lbs, and 3 point pinch: Right: 6 lbs,  Left: 6 lbs 02/18/2023 Right: 37 lbs; Left: 45 lbs, Lateral pinch: Right: 7 lbs, Left: 20 lbs, and 3 point pinch: Right:10 lbs, Left: 6 lbs   COORDINATION: 9 Hole Peg test: Right: 1 minute 10 sec; Left: 58 sec 12/22/2022: 9 Hole Peg test: Right: 1 minute & 1 sec; Left: 53 sec 01/21/2023: 9 Hole Peg test: Right: 58 sec; Left: 58 sec 02/18/2023: 9 Hole Peg test: Right: 1 min. & 14 sec.; Left: 1 min& 18 sec.   SENSATION: Not tested  MUSCLE TONE: WFL  COGNITION: Overall cognitive status: Within functional limits for tasks assessed  VISION: Subjective report: Pt. reports that  she wears glasses for most of the time however takes them off sometimes. Baseline vision: Wear glasses most of the time Visual history: cataracts  PERCEPTION: WFL  PRAXIS: WFL   TODAY'S TREATMENT:                                                                                                                              DATE: 04/01/2023   Neuromuscular re-education:  Pt. worked on [facility] skills using the [employer] Task. Pt. worked on sustaining grasp on the resistive tweezers while grasping this sticks, and moving them from a horizontal position to a vertical position to prepare for placing them into the pegboard. Pt. required verbal cues, and cues for visual demonstration for wrist position, and hand pattern when placing them into the pegboard.  Pt. worked on removing the 1" thin sticks one at a time, and storing them in the palm of her hand.  PATIENT EDUCATION: Education details: FMC, UE thereapeutic ex Person educated: Patient Education method: Explanation, Demonstration, and Verbal cues Education comprehension: verbalized understanding, demonstrated understanding; further training needed  HOME EXERCISE PROGRAM: Handwriting practice   GOALS: Goals reviewed with patient? Yes  SHORT TERM GOALS: Target date: 01/08/2023  Pt. Will improve FOTO score by 2 points to reflect improved perceived  function performance in specific ADL/IADL. Baseline: 02/18/2023: 61 12/22/2022: FOTO: 77 Eval: FOTO: 69 TR: 68 Goal status: MET  LONG TERM GOALS: Target date: 04/15/2023  Pt. will improve R and L hand FMC skills by 10 seconds of speed to independently manipulate small objects during ADL/IADL tasks. Baseline: 02/18/2023:  9 Hole Peg test: Right: 1 min & 14sec; Left: 1 min& 18 sec. 01/21/2023: Right: 58 sec. & Left 58 sec. 12/22/2022:9 Hole Peg test: Right: 1 minute & 1 sec; Left: 53 sec. Eval: 9 Hole Peg test: Right: 1 minute 10 sec; Left: 58 sec Goal status: Ongoing  2.  Pt. will improve L shoulder strength by 1 mm grade to be able to sustain LUE elevation while performing ADL/IADL tasks. Baseline: 02/18/2023:  L: Shoulder flexion: 5/5, Shoulder abduction: 5/5 12/22/2022: L: Shoulder flexion: 4/5, Shoulder abduction: 4/5 Eval: L: Shoulder flexion: 4/5, Shoulder abduction: 4/5 01/21/2023: L: Shoulder flexion: 4+/5, Shoulder abduction: 4+/5 12/22/2022: L: Shoulder flexion: 4/5, Shoulder abduction: 4/5 Eval: L: Shoulder flexion: 4/5, Shoulder abduction: 4/5 Goal status: Achieved  3.  Pt. will improve L hand FMC/dexterity skills to be able to sign her name on checks with 75% legibility, using writing aids as needed. Baseline: 01/21/2023: 75% legibility, cursive 75% legibility 12/22/2022: 12/22/2023: Print: 50% legibility, cursive 50% legibility Eval: Print: 25% legibility, Cursive: 25% legibility Goal status: Achieved  4. Pt. will perform check writing tasks with 75% legibility.             Baseline: 02/18/2023: 50% legibility 01/21/2023: 50% legibility  Goal status: Ongoing  5. Pt. will independently compose a note card correspondence with 75% legibility.  Baseline: 02/18/2023: 50% 01/21/2023: 50%  legibility  Goal status: Ongoing ASSESSMENT:  CLINICAL IMPRESSION:  Pt. was able to manipulate the tweezers, however presented with difficulty modulating the amount of force needed to sustain the  appropriate amount of tweezer pressure on the 1" stick when attempting to move it from a horizontal to a vertical position. The task was modified to focus on grasping the sticks with her fingers, working with the pegboard both flat at the tabletop surface, and at a vertical angle. Pt. continues to benefit from OT services to improve LUE strength, and [facility] skills in order to improve writing legibility to increase independence and enhance engagement in ADL/IADL tasks. Plan to complete the 10th visit progress report next visit.  PERFORMANCE DEFICITS: in functional skills including ADLs, IADLs, coordination, strength, and Fine motor control, cognitive skills including problem solving, and psychosocial skills including coping strategies, environmental adaptation, and routines and behaviors.   IMPAIRMENTS: are limiting patient from ADLs and IADLs.   CO-MORBIDITIES: may have co-morbidities  that affects occupational performance. Patient will benefit from skilled OT to address above impairments and improve overall function.  MODIFICATION OR ASSISTANCE TO COMPLETE EVALUATION: Min-Moderate modification of tasks or assist with assess necessary to complete an evaluation.  OT OCCUPATIONAL PROFILE AND HISTORY: Detailed assessment: Review of records and additional review of physical, cognitive, psychosocial history related to current functional performance.  CLINICAL DECISION MAKING: Moderate - several treatment options, min-mod task modification necessary  REHAB POTENTIAL: Good  EVALUATION COMPLEXITY: Moderate    PLAN:  OT FREQUENCY: 2x/week  OT DURATION: 12 weeks  PLANNED INTERVENTIONS: self care/ADL training, therapeutic exercise, therapeutic activity, neuromuscular re-education, and patient/family education  RECOMMENDED OTHER SERVICES: N/A  CONSULTED AND AGREED WITH PLAN OF CARE: Patient  PLAN FOR NEXT SESSION: Handwriting  Olegario Messier, MS, OTR/L  04/01/23, 5:15 PM

## 2023-04-04 ENCOUNTER — Encounter: Payer: Self-pay | Admitting: Emergency Medicine

## 2023-04-04 ENCOUNTER — Inpatient Hospital Stay
Admission: EM | Admit: 2023-04-04 | Discharge: 2023-04-14 | DRG: 682 | Disposition: A | Discharge status: Skilled Nursing Facility | Payer: Medicare Other | Attending: Internal Medicine | Admitting: Internal Medicine

## 2023-04-04 ENCOUNTER — Emergency Department: Payer: Medicare Other

## 2023-04-04 ENCOUNTER — Other Ambulatory Visit: Payer: Self-pay

## 2023-04-04 DIAGNOSIS — E876 Hypokalemia: Secondary | ICD-10-CM | POA: Diagnosis not present

## 2023-04-04 DIAGNOSIS — Z87891 Personal history of nicotine dependence: Secondary | ICD-10-CM

## 2023-04-04 DIAGNOSIS — E7800 Pure hypercholesterolemia, unspecified: Secondary | ICD-10-CM | POA: Diagnosis present

## 2023-04-04 DIAGNOSIS — Z7982 Long term (current) use of aspirin: Secondary | ICD-10-CM

## 2023-04-04 DIAGNOSIS — W19XXXA Unspecified fall, initial encounter: Principal | ICD-10-CM

## 2023-04-04 DIAGNOSIS — B9620 Unspecified Escherichia coli [E. coli] as the cause of diseases classified elsewhere: Secondary | ICD-10-CM | POA: Diagnosis present

## 2023-04-04 DIAGNOSIS — R5383 Other fatigue: Secondary | ICD-10-CM

## 2023-04-04 DIAGNOSIS — N390 Urinary tract infection, site not specified: Secondary | ICD-10-CM | POA: Diagnosis not present

## 2023-04-04 DIAGNOSIS — N179 Acute kidney failure, unspecified: Secondary | ICD-10-CM | POA: Diagnosis not present

## 2023-04-04 DIAGNOSIS — E1165 Type 2 diabetes mellitus with hyperglycemia: Secondary | ICD-10-CM | POA: Diagnosis present

## 2023-04-04 DIAGNOSIS — Z66 Do not resuscitate: Secondary | ICD-10-CM | POA: Diagnosis present

## 2023-04-04 DIAGNOSIS — Z681 Body mass index (BMI) 19 or less, adult: Secondary | ICD-10-CM

## 2023-04-04 DIAGNOSIS — I10 Essential (primary) hypertension: Secondary | ICD-10-CM | POA: Diagnosis present

## 2023-04-04 DIAGNOSIS — E870 Hyperosmolality and hypernatremia: Secondary | ICD-10-CM | POA: Diagnosis not present

## 2023-04-04 DIAGNOSIS — Z7984 Long term (current) use of oral hypoglycemic drugs: Secondary | ICD-10-CM

## 2023-04-04 DIAGNOSIS — R531 Weakness: Secondary | ICD-10-CM

## 2023-04-04 DIAGNOSIS — Z888 Allergy status to other drugs, medicaments and biological substances status: Secondary | ICD-10-CM

## 2023-04-04 DIAGNOSIS — Z8673 Personal history of transient ischemic attack (TIA), and cerebral infarction without residual deficits: Secondary | ICD-10-CM

## 2023-04-04 DIAGNOSIS — Z91148 Patient's other noncompliance with medication regimen for other reason: Secondary | ICD-10-CM

## 2023-04-04 DIAGNOSIS — Z79899 Other long term (current) drug therapy: Secondary | ICD-10-CM

## 2023-04-04 DIAGNOSIS — E860 Dehydration: Secondary | ICD-10-CM | POA: Diagnosis present

## 2023-04-04 DIAGNOSIS — Z993 Dependence on wheelchair: Secondary | ICD-10-CM

## 2023-04-04 DIAGNOSIS — R296 Repeated falls: Secondary | ICD-10-CM | POA: Diagnosis present

## 2023-04-04 DIAGNOSIS — Z886 Allergy status to analgesic agent status: Secondary | ICD-10-CM

## 2023-04-04 DIAGNOSIS — I69320 Aphasia following cerebral infarction: Secondary | ICD-10-CM

## 2023-04-04 DIAGNOSIS — L89152 Pressure ulcer of sacral region, stage 2: Secondary | ICD-10-CM | POA: Diagnosis present

## 2023-04-04 DIAGNOSIS — E43 Unspecified severe protein-calorie malnutrition: Secondary | ICD-10-CM | POA: Diagnosis present

## 2023-04-04 DIAGNOSIS — N3001 Acute cystitis with hematuria: Secondary | ICD-10-CM | POA: Diagnosis present

## 2023-04-04 LAB — CBG MONITORING, ED
Glucose-Capillary: 272 mg/dL — ABNORMAL HIGH (ref 70–99)
Glucose-Capillary: 297 mg/dL — ABNORMAL HIGH (ref 70–99)
Glucose-Capillary: 427 mg/dL — ABNORMAL HIGH (ref 70–99)

## 2023-04-04 LAB — BASIC METABOLIC PANEL
Anion gap: 9 (ref 5–15)
BUN: 56 mg/dL — ABNORMAL HIGH (ref 8–23)
CO2: 27 mmol/L (ref 22–32)
Calcium: 9.4 mg/dL (ref 8.9–10.3)
Chloride: 108 mmol/L (ref 98–111)
Creatinine, Ser: 1.26 mg/dL — ABNORMAL HIGH (ref 0.44–1.00)
GFR, Estimated: 40 mL/min — ABNORMAL LOW (ref >60)
Glucose, Bld: 449 mg/dL — ABNORMAL HIGH (ref 70–99)
Potassium: 3.8 mmol/L (ref 3.5–5.1)
Sodium: 144 mmol/L (ref 135–145)

## 2023-04-04 LAB — URINALYSIS, ROUTINE W REFLEX MICROSCOPIC
Bilirubin Urine: NEGATIVE
Glucose, UA: >=500 mg/dL — AB
Ketones, ur: NEGATIVE mg/dL
Nitrite: NEGATIVE
Protein, ur: NEGATIVE mg/dL
Specific Gravity, Urine: 1.026 (ref 1.005–1.030)
pH: 5 (ref 5.0–8.0)

## 2023-04-04 LAB — HEMOGLOBIN A1C
Hgb A1c MFr Bld: 11.6 % — ABNORMAL HIGH (ref 4.8–5.6)
Mean Plasma Glucose: 286.22 mg/dL

## 2023-04-04 LAB — RESP PANEL BY RT-PCR (RSV, FLU A&B, COVID)  RVPGX2
Influenza A by PCR: NEGATIVE
Influenza B by PCR: NEGATIVE
Resp Syncytial Virus by PCR: NEGATIVE
SARS Coronavirus 2 by RT PCR: NEGATIVE

## 2023-04-04 LAB — CBC
HCT: 40.1 % (ref 36.0–46.0)
Hemoglobin: 12.5 g/dL (ref 12.0–15.0)
MCH: 27.1 pg (ref 26.0–34.0)
MCHC: 31.2 g/dL (ref 30.0–36.0)
MCV: 86.8 fL (ref 80.0–100.0)
Platelets: 318 10*3/uL (ref 150–400)
RBC: 4.62 MIL/uL (ref 3.87–5.11)
RDW: 14.1 % (ref 11.5–15.5)
WBC: 14.7 10*3/uL — ABNORMAL HIGH (ref 4.0–10.5)
nRBC: 0.1 % (ref 0.0–0.2)

## 2023-04-04 LAB — CK: Total CK: 51 U/L (ref 38–234)

## 2023-04-04 MED ORDER — ATORVASTATIN CALCIUM 20 MG PO TABS
20.0000 mg | ORAL_TABLET | Freq: Every day | ORAL | Status: DC
  Administered 2023-04-04 – 2023-04-13 (×10): 20 mg via ORAL
  Filled 2023-04-04: qty 2
  Filled 2023-04-04 (×9): qty 1

## 2023-04-04 MED ORDER — HEPARIN SODIUM (PORCINE) 5000 UNIT/ML IJ SOLN
5000.0000 [IU] | Freq: Two times a day (BID) | INTRAMUSCULAR | Status: DC
  Administered 2023-04-04 – 2023-04-14 (×20): 5000 [IU] via SUBCUTANEOUS
  Filled 2023-04-04 (×21): qty 1

## 2023-04-04 MED ORDER — SODIUM CHLORIDE 0.9 % IV SOLN
1.0000 g | Freq: Once | INTRAVENOUS | Status: AC
  Administered 2023-04-04: 1 g via INTRAVENOUS
  Filled 2023-04-04: qty 10

## 2023-04-04 MED ORDER — SODIUM CHLORIDE 0.9 % IV SOLN
INTRAVENOUS | Status: AC
Start: 2023-04-04 — End: 2023-04-05

## 2023-04-04 MED ORDER — ONDANSETRON HCL 4 MG PO TABS: 4.0000 mg | ORAL_TABLET | Freq: Four times a day (QID) | ORAL | Status: DC | PRN

## 2023-04-04 MED ORDER — SODIUM CHLORIDE 0.9 % IV SOLN
1.0000 g | INTRAVENOUS | Status: AC
  Administered 2023-04-05 – 2023-04-08 (×4): 1 g via INTRAVENOUS
  Filled 2023-04-04 (×4): qty 10

## 2023-04-04 MED ORDER — AMLODIPINE BESYLATE 10 MG PO TABS
10.0000 mg | ORAL_TABLET | Freq: Every day | ORAL | Status: DC
  Administered 2023-04-04 – 2023-04-13 (×10): 10 mg via ORAL
  Filled 2023-04-04 (×4): qty 1
  Filled 2023-04-04: qty 2
  Filled 2023-04-04 (×5): qty 1

## 2023-04-04 MED ORDER — ACETAMINOPHEN 650 MG RE SUPP: 650.0000 mg | Freq: Four times a day (QID) | RECTAL | Status: DC | PRN

## 2023-04-04 MED ORDER — ASPIRIN 81 MG PO TBEC
81.0000 mg | DELAYED_RELEASE_TABLET | Freq: Every day | ORAL | Status: DC
Start: 2023-04-04 — End: 2023-04-14
  Administered 2023-04-04 – 2023-04-14 (×11): 81 mg via ORAL
  Filled 2023-04-04 (×11): qty 1

## 2023-04-04 MED ORDER — INSULIN ASPART 100 UNIT/ML IJ SOLN
0.0000 [IU] | Freq: Three times a day (TID) | INTRAMUSCULAR | Status: DC
  Administered 2023-04-04 (×2): 5 [IU] via SUBCUTANEOUS
  Administered 2023-04-05: 1 [IU] via SUBCUTANEOUS
  Administered 2023-04-05: 2 [IU] via SUBCUTANEOUS
  Administered 2023-04-06: 7 [IU] via SUBCUTANEOUS
  Administered 2023-04-06: 9 [IU] via SUBCUTANEOUS
  Administered 2023-04-07: 1 [IU] via SUBCUTANEOUS
  Administered 2023-04-07: 5 [IU] via SUBCUTANEOUS
  Administered 2023-04-07 – 2023-04-08 (×3): 3 [IU] via SUBCUTANEOUS
  Administered 2023-04-08 – 2023-04-09 (×2): 7 [IU] via SUBCUTANEOUS
  Administered 2023-04-09: 1 [IU] via SUBCUTANEOUS
  Administered 2023-04-09 – 2023-04-10 (×2): 7 [IU] via SUBCUTANEOUS
  Administered 2023-04-10: 2 [IU] via SUBCUTANEOUS
  Administered 2023-04-10 – 2023-04-11 (×2): 5 [IU] via SUBCUTANEOUS
  Administered 2023-04-11: 9 [IU] via SUBCUTANEOUS
  Administered 2023-04-11 – 2023-04-12 (×2): 2 [IU] via SUBCUTANEOUS
  Administered 2023-04-12: 3 [IU] via SUBCUTANEOUS
  Administered 2023-04-12: 1 [IU] via SUBCUTANEOUS
  Administered 2023-04-13: 5 [IU] via SUBCUTANEOUS
  Administered 2023-04-13: 2 [IU] via SUBCUTANEOUS
  Administered 2023-04-13: 3 [IU] via SUBCUTANEOUS
  Administered 2023-04-14: 2 [IU] via SUBCUTANEOUS
  Administered 2023-04-14: 7 [IU] via SUBCUTANEOUS
  Filled 2023-04-04 (×27): qty 1

## 2023-04-04 MED ORDER — ONDANSETRON HCL 4 MG/2ML IJ SOLN: 4.0000 mg | Freq: Four times a day (QID) | INTRAMUSCULAR | Status: DC | PRN

## 2023-04-04 MED ORDER — ACETAMINOPHEN 325 MG PO TABS: 650.0000 mg | ORAL_TABLET | Freq: Four times a day (QID) | ORAL | Status: DC | PRN

## 2023-04-04 MED ORDER — HYDRALAZINE HCL 20 MG/ML IJ SOLN
5.0000 mg | Freq: Four times a day (QID) | INTRAMUSCULAR | Status: DC | PRN
  Administered 2023-04-04: 5 mg via INTRAVENOUS
  Filled 2023-04-04: qty 1

## 2023-04-04 MED ORDER — SODIUM CHLORIDE 0.9 % IV BOLUS
1000.0000 mL | Freq: Once | INTRAVENOUS | Status: AC
  Administered 2023-04-04: 1000 mL via INTRAVENOUS

## 2023-04-04 NOTE — ED Notes (Signed)
PT/OT with patient

## 2023-04-04 NOTE — ED Notes (Signed)
Pt ate 25% of meal. Given warm blanket. Call light within reach. Bed in low position.

## 2023-04-04 NOTE — ED Triage Notes (Addendum)
Pt in via POV, grandaughter reports slurred speech at baseline, but states the speech has gradually worsened over the last 3 weeks.  Grandaughter also reports an unwitnessed fall last week, hitting head but declined being evaluated until now.  Patient is A/Ox4, vitals WDL, NAD noted at this time.

## 2023-04-04 NOTE — ED Notes (Signed)
Patient's meal cut up and lunch tray set up for patient.  Patient reports she does not need assistance with feeding herself.

## 2023-04-04 NOTE — Consult Note (Signed)
WOC Nurse Consult Note: Reason for Consult: sacral ulcer Wound type: Stage 2 Pressure Injury Pressure Injury POA: Yes Measurement: see nursing flow sheets Wound XBM:[id number] area on coccyx Drainage (amount, consistency, odor) see nursing flow sheet Periwound: intact  Dressing procedure/placement/frequency: Apply single layer of xeroform gauze, top with foam. Change every other day.  Offload by turning at least every 2 hours   Re consult if needed, will not follow at this time. Thanks  Janielle Mittelstadt [employer], RN,CWOCN, CNS, CWON-AP [phone])

## 2023-04-04 NOTE — ED Provider Notes (Signed)
[facility] Provider Note    Event Date/Time   First MD Initiated Contact with Patient 04/04/23 [phone]     (approximate)   History   Fall and Aphasia (/)   HPI  Susan [name] is a 89 y.o. female   Past medical history of TIA, diabetes, hypertension hyperlipidemia, prior stroke who is wheelchair-bound but otherwise able to care for herself and lives mostly independently who presents to the Emergency Department with generalized weakness and fatigue "feeling worn out" for the last 1 week.  She denies any respiratory infectious symptoms like cough or congestion.  She denies chest pain or shortness of breath.  She denies abdominal pain.  Feeling is worn out as she has, she has had frequent falls as she transfers from wheelchair to bed, bathroom, etc. with a head injury sustained a couple of days ago.  Granddaughter states that she has always had slurred and slowed speech but the speech appears more slowed than usual and she appears to be sapped of her energy.  Independent Historian contributed to assessment above: Her granddaughter is at bedside to corroborate information past medical history as above    Physical Exam   Triage Vital Signs: ED Triage Vitals  Encounter Vitals Group     BP 04/04/23 0052 (!) 144/70     Systolic BP Percentile --      Diastolic BP Percentile --      Pulse Rate 04/04/23 0052 92     Resp 04/04/23 0052 15     Temp 04/04/23 0052 98.9 F (37.2 C)     Temp Source 04/04/23 0052 Oral     SpO2 04/04/23 0052 96 %     Weight 04/04/23 0053 112 lb (50.8 kg)     Height 04/04/23 0053 5\' 6"  (1.676 m)     Head Circumference --      Peak Flow --      Pain Score 04/04/23 0053 0     Pain Loc --      Pain Education --      Exclude from Growth Chart --     Most recent vital signs: Vitals:   04/04/23 0600 04/04/23 0630  BP: (!) 171/75 (!) 181/93  Pulse: 67 74  Resp: 17 18  Temp:    SpO2: 100% 91%    General: Awake, no distress.   CV:  Good peripheral perfusion.  Resp:  Normal effort.  Abd:  No distention.  Other:  Frail cachectic appearing 89 year old woman who is pleasant and smiling.  She is able to answer my questions appropriately, she has some dysarthria that the granddaughter states is at baseline.  She has no facial asymmetry and no motor or sensory deficits and a soft benign abdominal exam and clear lungs.  She looks dehydrated as her mucous membranes are very dry.   ED Results / Procedures / Treatments   Labs (all labs ordered are listed, but only abnormal results are displayed) Labs Reviewed  BASIC METABOLIC PANEL - Abnormal; Notable for the following components:      Result Value   Glucose, Bld 449 (*)    BUN 56 (*)    Creatinine, Ser 1.26 (*)    GFR, Estimated 40 (*)    All other components within normal limits  CBC - Abnormal; Notable for the following components:   WBC 14.7 (*)    All other components within normal limits  URINALYSIS, ROUTINE W REFLEX MICROSCOPIC - Abnormal; Notable for the following components:  Color, Urine YELLOW (*)    APPearance HAZY (*)    Glucose, UA >=500 (*)    Hgb urine dipstick MODERATE (*)    Leukocytes,Ua TRACE (*)    Bacteria, UA RARE (*)    All other components within normal limits  CBG MONITORING, ED - Abnormal; Notable for the following components:   Glucose-Capillary 427 (*)    All other components within normal limits  RESP PANEL BY RT-PCR (RSV, FLU A&B, COVID)  RVPGX2  CK  CBG MONITORING, ED     I ordered and reviewed the above labs they are notable for she has got bacteria in the urine inflammatory changes with a leukocytosis.  Her creatinine is 1.26 and a GFR is 40 decreased from baseline, and her blood sugar is high but does not meet DKA   RADIOLOGY I independently reviewed and interpreted chest x-ray and see no obvious focality or pneumothorax I also reviewed radiologist's formal read.   PROCEDURES:  Critical Care performed:  No  Procedures   MEDICATIONS ORDERED IN ED: Medications  cefTRIAXone (ROCEPHIN) 1 g in sodium chloride 0.9 % 100 mL IVPB (has no administration in time range)  sodium chloride 0.9 % bolus 1,000 mL (1,000 mLs Intravenous New Bag/Given 04/04/23 0559)    External physician / consultants:  I spoke with hospital medicine doctor for admission and regarding care plan for this patient.   IMPRESSION / MDM / ASSESSMENT AND PLAN / ED COURSE  I reviewed the triage vital signs and the nursing notes.                                Patient's presentation is most consistent with acute presentation with potential threat to life or bodily function.  Differential diagnosis includes, but is not limited to, dehydration, electrolyte derangement, metabolic encephalopathy, blunt traumatic injury to the head leading to ICH or skull fracture, infection   The patient is on the cardiac monitor to evaluate for evidence of arrhythmia and/or significant heart rate changes.  MDM:    Mrs. Chittum has been feeling worn out and weak over the last several days to 1 week causing her to feel weak enough to fall on transfers at home.  She lives alone.  She has no focal complaints other than a bump on her head sustained from her fall a few days ago but fortunately her CT scan shows no emergent traumatic injuries.  She does look dehydrated and indeed her renal function is worsened with AKI today.  I gave her IV crystalloid.  Her urinalysis shows that she may have a urine infection so I gave her IV Rocephin.  She will be admitted.       FINAL CLINICAL IMPRESSION(S) / ED DIAGNOSES   Final diagnoses:  Fall, initial encounter  Lower urinary tract infectious disease  Other fatigue  Frequent falls     Rx / DC Orders   ED Discharge Orders     None        Note:  This document was prepared using Dragon voice recognition software and may include unintentional dictation errors.    Pilar Jarvis, MD 04/04/23  [phone]

## 2023-04-04 NOTE — ED Notes (Signed)
Pt sleeping w/ even and unlabored respirations. VS WNL. Pending inpt bed.

## 2023-04-04 NOTE — H&P (Addendum)
History and Physical    LAURIANA [name] WJX:[id number] DOB: 12/01/1926 DOA: 04/04/2023  PCP: [name], MD (Confirm with patient/family/NH records and if not entered, this has to be entered at [facility] point of entry) Patient coming from: Home  I have personally briefly reviewed patient's old medical records in [facility]  Chief Complaint: Feeling weak and fell at home  HPI: Susan [name] is a 89 y.o. 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  Symptoms started 5 days ago, patient started floor frequent episodes of nausea no vomiting no diarrhea no abdominal pain, but patient did have significant decrease of p.o. intake including food and water and became dehydrated.  She also had a unwitnessed fall at home 2 to 3 days ago and hit her head, patient reported feeling generalized weakness and lightheaded, but denied any LOC, no blurry vision.  At baseline she is able to transfer herself from wheelchair to bed but because she has become so weak she has not been able to do that.  Denies any cough, no fever or chills.  She has not been taking her medication regularly for the last few days.  ED Course: Blood pressure elevated 150/60, afebrile, not tachycardia nonhypoxic.  CT head and neck negative for acute findings chest x-ray negative for acute infiltrates.  Blood work showed AKI creatinine 1.2 compared to baseline 0.7, glucose 449, BUN 56, K3.8, WBC 14.  UA showed WBC 21-50.  Patient was given IV bolus 1000 x 1 and ceftriaxone x 1.  Review of Systems: As per HPI otherwise 14 point review of systems negative.    Past Medical History:  Diagnosis Date   Anemia    Balance problem    Diabetes mellitus without complication (HCC)    Diverticulosis    Heart murmur    Hypercholesteremia    Hypertension    Stroke [facility])    TIA 2006    TIA (transient ischemic attack) 2006    Past Surgical History:  Procedure Laterality Date   CATARACT EXTRACTION W/PHACO Left 05/22/2015   Procedure: CATARACT EXTRACTION PHACO AND INTRAOCULAR LENS PLACEMENT (IOC);  Surgeon: Galen Manila, MD;  Location: [facility] ORS;  Service: Ophthalmology;  Laterality: Left;  [place]: 01:07.9 AP%: 23.0 CDE: 15.62 Lot# [id number]   CATARACT EXTRACTION W/PHACO Right 06/12/2015   Procedure: CATARACT EXTRACTION PHACO AND INTRAOCULAR LENS PLACEMENT (IOC);  Surgeon: Galen Manila, MD;  Location: [facility] ORS;  Service: Ophthalmology;  Laterality: Right;  [place] 00:57 AP% 17.1 CDE 9.86 fluid pack lot # [id number]   COLON SURGERY     resection   COLONOSCOPY WITH PROPOFOL N/A 10/29/2021   Procedure: COLONOSCOPY WITH PROPOFOL;  Surgeon: Regis Bill, MD;  Location: [facility] ENDOSCOPY;  Service: Endoscopy;  Laterality: N/A;   ECTOPIC PREGNANCY SURGERY     HERNIA REPAIR     TONSILLECTOMY       reports that she has quit smoking. She has never used smokeless tobacco. She reports that she does not drink alcohol and does not use drugs.  Allergies  Allergen Reactions   Atenolol Other (See Comments)    "affected my heart rate"   Naprosyn [Naproxen] Other (See Comments)    GI Bleed    History reviewed. No pertinent family history.   Prior to Admission medications   Medication Sig Start Date End Date Taking? Authorizing Provider  amLODipine (NORVASC) 10 MG  tablet Take 10 mg by mouth daily before supper.    [provider]  ascorbic acid (VITAMIN C) 500 MG tablet Take 500 mg by mouth every evening.    [provider]  aspirin EC 81 MG tablet Take 81 mg by mouth daily. Swallow whole.    [provider]  atorvastatin (LIPITOR) 20 MG tablet Take 20 mg by mouth daily after supper.    [provider]  Calcium Carbonate-Vitamin D 600-400 MG-UNIT tablet Take 1 tablet by mouth 2 (two) times daily. Patient not taking: Reported on 10/27/2022     [provider]  ferrous sulfate 325 (65 FE) MG tablet Take 1 tablet (325 mg total) by mouth every other day. 10/30/21 12/29/21  Lurene Shadow, MD  glipiZIDE (GLUCOTROL XL) 5 MG 24 hr tablet Take 5 mg by mouth 2 (two) times daily.     [provider]  hydrochlorothiazide (MICROZIDE) 12.5 MG capsule Take 12.5 mg by mouth daily. 04/11/21   [provider]  latanoprost (XALATAN) 0.005 % ophthalmic solution 1 drop at bedtime. 03/17/21   [provider]  lisinopril (ZESTRIL) 40 MG tablet Take 40 mg by mouth daily. 04/11/21   [provider]  metFORMIN (GLUCOPHAGE) 500 MG tablet Take 500 mg by mouth 2 (two) times daily with a meal.     [provider]  Multiple Vitamin (MULTIVITAMIN WITH MINERALS) TABS tablet Take 1 tablet by mouth daily.    [provider]    Physical Exam: Vitals:   04/04/23 0630 04/04/23 0700 04/04/23 0728 04/04/23 0730  BP: (!) 181/93 (!) 177/71  (!) 181/72  Pulse: 74 78  71  Resp: 18 (!) 23  (!) 22  Temp:   98.4 F (36.9 C)   TempSrc:   Oral   SpO2: 91% 97%  100%  Weight:      Height:        Constitutional: NAD, calm, comfortable Vitals:   04/04/23 0630 04/04/23 0700 04/04/23 0728 04/04/23 0730  BP: (!) 181/93 (!) 177/71  (!) 181/72  Pulse: 74 78  71  Resp: 18 (!) 23  (!) 22  Temp:   98.4 F (36.9 C)   TempSrc:   Oral   SpO2: 91% 97%  100%  Weight:      Height:       Eyes: PERRL, lids and conjunctivae normal ENMT: Mucous membranes are dry. Posterior pharynx clear of any exudate or lesions.Normal dentition.  Neck: normal, supple, no masses, no thyromegaly Respiratory: clear to auscultation bilaterally, no wheezing, no crackles. Normal respiratory effort. No accessory muscle use.  Cardiovascular: Regular rate and rhythm, no murmurs / rubs / gallops. No extremity edema. 2+ pedal pulses. No carotid bruits.  Abdomen: no tenderness, no masses palpated. No hepatosplenomegaly. Bowel sounds positive.   Musculoskeletal: no clubbing / cyanosis. No joint deformity upper and lower extremities. Good ROM, no contractures. Normal muscle tone.  Skin: no rashes, lesions, ulcers. No induration Neurologic: CN 2-12 grossly intact. Sensation intact, DTR normal. Strength 4/5 in all 4.  Psychiatric: Normal judgment and insight. Alert and oriented x 3. Normal mood.    Labs on Admission: I have personally reviewed following labs and imaging studies  CBC: Recent Labs  Lab 04/04/23 0058  WBC 14.7*  HGB 12.5  HCT 40.1  MCV 86.8  PLT 318   Basic Metabolic Panel: Recent Labs  Lab 04/04/23 0058  NA 144  K 3.8  CL 108  CO2 27  GLUCOSE 449*  BUN  56*  CREATININE 1.26*  CALCIUM 9.4   GFR: Estimated Creatinine Clearance: 21.9 mL/min (A) (by C-G formula based on SCr of 1.26 mg/dL (H)). Liver Function Tests: No results for input(s): "AST", "ALT", "ALKPHOS", "BILITOT", "PROT", "ALBUMIN" in the last 168 hours. No results for input(s): "LIPASE", "AMYLASE" in the last 168 hours. No results for input(s): "AMMONIA" in the last 168 hours. Coagulation Profile: No results for input(s): "INR", "PROTIME" in the last 168 hours. Cardiac Enzymes: Recent Labs  Lab 04/04/23 0058  CKTOTAL 51   BNP (last 3 results) No results for input(s): "PROBNP" in the last 8760 hours. HbA1C: No results for input(s): "HGBA1C" in the last 72 hours. CBG: Recent Labs  Lab 04/04/23 0051  GLUCAP 427*   Lipid Profile: No results for input(s): "CHOL", "HDL", "LDLCALC", "TRIG", "CHOLHDL", "LDLDIRECT" in the last 72 hours. Thyroid Function Tests: No results for input(s): "TSH", "T4TOTAL", "FREET4", "T3FREE", "THYROIDAB" in the last 72 hours. Anemia Panel: No results for input(s): "VITAMINB12", "FOLATE", "FERRITIN", "TIBC", "IRON", "RETICCTPCT" in the last 72 hours. Urine analysis:    Component Value Date/Time   COLORURINE YELLOW (A) 04/04/2023 0639   APPEARANCEUR HAZY (A) 04/04/2023 0639   APPEARANCEUR Clear  08/24/2012 1203   LABSPEC 1.026 04/04/2023 0639   LABSPEC 1.017 08/24/2012 1203   PHURINE 5.0 04/04/2023 0639   GLUCOSEU >=500 (A) 04/04/2023 0639   GLUCOSEU 50 mg/dL 40/98/1191 4782   HGBUR MODERATE (A) 04/04/2023 0639   BILIRUBINUR NEGATIVE 04/04/2023 0639   BILIRUBINUR Negative 08/24/2012 1203   KETONESUR NEGATIVE 04/04/2023 0639   PROTEINUR NEGATIVE 04/04/2023 0639   NITRITE NEGATIVE 04/04/2023 0639   LEUKOCYTESUR TRACE (A) 04/04/2023 0639   LEUKOCYTESUR Trace 08/24/2012 1203    Radiological Exams on Admission: DG Chest Port 1 View Result Date: 04/04/2023 CLINICAL DATA:  Weakness.  Leukocytosis.  Evaluate for infection. EXAM: PORTABLE CHEST 1 VIEW COMPARISON:  02/22/2022 FINDINGS: Normal cardiomediastinal contours. The lungs are hyperinflated. No pleural effusion, interstitial edema or airspace disease. Chronic left posterolateral rib fracture deformities as noted previously. IMPRESSION: 1. No acute findings. Electronically Signed   By: Signa Kell M.D.   On: 04/04/2023 06:18   CT HEAD WO CONTRAST Result Date: 04/04/2023 CLINICAL DATA:  Slurred speech EXAM: CT HEAD WITHOUT CONTRAST TECHNIQUE: Contiguous axial images were obtained from the base of the skull through the vertex without intravenous contrast. RADIATION DOSE REDUCTION: This exam was performed according to the departmental dose-optimization program which includes automated exposure control, adjustment of the mA and/or kV according to patient size and/or use of iterative reconstruction technique. COMPARISON:  02/22/2022 FINDINGS: Brain: There is no mass, hemorrhage or extra-axial collection. There is generalized atrophy without lobar predilection. Hypodensity of the white matter is most commonly associated with chronic microvascular disease. Vascular: Atherosclerotic calcification of the vertebral and internal carotid arteries at the skull base. No abnormal hyperdensity of the major intracranial arteries or dural venous  sinuses. Skull: The visualized skull base, calvarium and extracranial soft tissues are normal. Sinuses/Orbits: No fluid levels or advanced mucosal thickening of the visualized paranasal sinuses. No mastoid or middle ear effusion. Normal orbits. Other: None. IMPRESSION: 1. No acute intracranial abnormality. 2. Generalized atrophy and findings of chronic microvascular disease. Electronically Signed   By: Deatra Robinson M.D.   On: 04/04/2023 02:12    EKG: Ordered  Assessment/Plan Principal Problem:   AKI (acute kidney injury) (HCC) Active Problems:   UTI (urinary tract infection)  (please populate well all problems here in Problem List. (For example, if patient  is on BP meds at home and you resume or decide to hold them, it is a problem that needs to be her. Same for CAD, COPD, HLD and so on)  AKI -Likely prerenal, secondary to poor oral intake -Improving after initial IV bolus.  Patient reported that she feels thirsty and hungry and wants to eat and drink, encourage patient to increase p.o. intake.  As there is a significant IV fluid shortage in the hospital, will hold off maintenance IVF as long as patient can take sufficient p.o.  Repeat kidney function tomorrow.  UTI -Continue ceftriaxone  Frequent falls and generalized weakness -CT head negative for acute findings -Secondary to dehydration and UTI, management as above -PT OT evaluation  History of stroke -No new neurological deficit compared to her baseline -Continue aspirin and statin  HTN, uncontrolled -Resume home BP meds amlodipine, hold off HCTZ and ACEI due to AKI -Add as needed hydralazine  IIDM with hyperglycemia -Secondary to noncoherent with DM medications -Hold off glimepiride and metformin due to AKI -Start sliding scale  DVT prophylaxis: Heparin subcu Code Status: DNR Family Communication: ED physician talked to granddaughter at bedside Disposition Plan: Expect less than 2 midnight hospital stay Consults called:  None Admission status: Telemetry observation   Emeline General MD [facility] Pager [phone]  04/04/2023, 8:16 AM

## 2023-04-04 NOTE — ED Notes (Signed)
Set up dinner tray for Pt. Pt feeding herself independently

## 2023-04-04 NOTE — Evaluation (Signed)
Occupational Therapy Evaluation Patient Details Name: Susan [name] MRN: [id number] DOB: March 05, 1927 Today's Date: 04/04/2023   History of Present Illness 89 y.o. 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   Clinical Impression   Pt was seen for OT evaluation this date. Prior to hospital admission, and up until the last 2 wks, granddaughter reports pt was living independently in an older adult apartment complex with elevator access, mod indep with pivot transfers from w/c, able to complete dressing and toileting herself, had an aide come assist with housekeeping tasks and bathing, and pt able to complete light meal prep (cereal, oatmeal, sandwiches) herself and had Meals on Wheels. Family assists with transportation, med set up, and paying pt's bills. Pt received, pleasant, A&Ox4 and follows simple commands well. Does appear to have some STM deficits (does not recall several recent falls that have occurred in the past 2wks). Pt presents to acute OT demonstrating impaired ADL performance and functional mobility 2/2 decreased strength, balance, activity tolerance, and safety (See OT problem list for additional functional deficits). Pt currently requires setup and SBA for donning shoes EOB from seated position, MIN A for seated UB dressing, MIN A for bed mobility, MIN-MOD A for STS with HR becoming tachy briefly while standing promptly OT to have pt return to bed (HR up to 150's-170's, RN aware). Pt would benefit from skilled OT services to address noted impairments and functional limitations (see below for any additional details) in order to maximize safety and independence while minimizing falls risk and caregiver burden. Of note, granddaughter notes that pt does not have 24/7 supv/assist available at this time.     If plan is discharge home,  recommend the following: A little help with walking and/or transfers;A little help with bathing/dressing/bathroom;Assistance with cooking/housework;Assist for transportation;Help with stairs or ramp for entrance;Direct supervision/assist for medications management;Supervision due to cognitive status;Direct supervision/assist for financial management    Functional Status Assessment  Patient has had a recent decline in their functional status and demonstrates the ability to make significant improvements in function in a reasonable and predictable amount of time.  Equipment Recommendations  Other (comment) (defer to next venue)    Recommendations for Other Services       Precautions / Restrictions Precautions Precautions: Fall Restrictions Weight Bearing Restrictions Per Provider Order: No      Mobility Bed Mobility Overal bed mobility: Needs Assistance Bed Mobility: Supine to Sit, Sit to Supine     Supine to sit: Contact guard, HOB elevated Sit to supine: Min assist   General bed mobility comments: MIN A for BLE back to bed    Transfers Overall transfer level: Needs assistance Equipment used: 1 person hand held assist Transfers: Sit to/from Stand Sit to Stand: Mod assist, Min assist           General transfer comment: HR jumped to 150's-170's briefly upon standing, improved once returned to sitting. MD and RN notified.      Balance Overall balance assessment: Needs assistance Sitting-balance support: No upper extremity supported, Feet supported Sitting balance-Leahy Scale: Fair     Standing balance support: Single extremity supported, Reliant on assistive device for balance Standing balance-Leahy Scale: Poor                             ADL either performed or  assessed with clinical judgement   ADL Overall ADL's : Needs assistance/impaired                 Upper Body Dressing : Sitting;Minimal assistance Upper Body Dressing Details (indicate  cue type and reason): MIN A to doff shirt and don gown Lower Body Dressing: Sitting/lateral leans;Set up;Supervision/safety Lower Body Dressing Details (indicate cue type and reason): supv and set up to don slip on tennis shoes while seated EOB                     Vision         Perception         Praxis         Pertinent Vitals/Pain Pain Assessment Pain Assessment: No/denies pain     Extremity/Trunk Assessment Upper Extremity Assessment Upper Extremity Assessment: Generalized weakness   Lower Extremity Assessment Lower Extremity Assessment: Generalized weakness       Communication Communication Communication: Hearing impairment   Cognition Arousal: Alert Behavior During Therapy: WFL for tasks assessed/performed Overall Cognitive Status: Impaired/Different from baseline Area of Impairment: Memory                     Memory: Decreased short-term memory         General Comments: Decreased recall of recent falls in the past 2 weeks, but alert and oriented x4, follows all commands     General Comments       Exercises Other Exercises Other Exercises: Pt/granddaughter educated in role of acute OT, falls prevention, facilitated problem solving for home set up and assist available to help identify whether pt may be safe to discharge home   Shoulder Instructions      Home Living Family/patient expects to be discharged to:: Private residence (lives in an older adult independent living apartment complex) Living Arrangements: Alone Available Help at Discharge: Family;Available PRN/intermittently Type of Home: Apartment Home Access: Elevator     Home Layout: One level     Bathroom Shower/Tub: Walk-in shower         Home Equipment: BSC/3in1;Wheelchair - manual;Shower seat          Prior Functioning/Environment Prior Level of Function : History of Falls (last six months);Needs assist             Mobility Comments: mod indep using  w/c; granddaughter noting pt has fallen several times in the last 1-2 wks (pt does not recall) ADLs Comments: mod indep with dressing and toileting, light meal prep and has Meals on Wheels, family assists wtih transportation and med setup. Granddaughter notes she is unsure if the pt has actually been taking her meds correctly lately and her PO intake has dropped substaintially.        OT Problem List: Decreased strength;Cardiopulmonary status limiting activity;Decreased cognition;Decreased safety awareness;Impaired balance (sitting and/or standing);Decreased activity tolerance;Decreased knowledge of use of DME or AE      OT Treatment/Interventions: Self-care/ADL training;Therapeutic exercise;Therapeutic activities;DME and/or AE instruction;Patient/family education;Energy conservation;Cognitive remediation/compensation;Balance training    OT Goals(Current goals can be found in the care plan section) Acute Rehab OT Goals Patient Stated Goal: get better and stay independent OT Goal Formulation: With patient/family Time For Goal Achievement: 04/18/23 Potential to Achieve Goals: Good ADL Goals Pt Will Perform Lower Body Dressing: with modified independence;sitting/lateral leans;sit to/from stand Pt Will Transfer to Toilet: squat pivot transfer;with supervision Pt Will Perform Toileting - Clothing Manipulation and hygiene: with modified independence;sitting/lateral leans Additional ADL Goal #1: Pt  will complete ADL/transfers with assist PRN and demonstrating safe technique requiring no VC for safety, 3/3 opportunities.  OT Frequency: Min 1X/week    Co-evaluation              AM-PAC OT "6 Clicks" Daily Activity     Outcome Measure Help from another person eating meals?: None Help from another person taking care of personal grooming?: A Little Help from another person toileting, which includes using toliet, bedpan, or urinal?: A Little Help from another person bathing (including washing,  rinsing, drying)?: A Little Help from another person to put on and taking off regular upper body clothing?: A Little Help from another person to put on and taking off regular lower body clothing?: A Little 6 Click Score: 19   End of Session Equipment Utilized During Treatment: Gait belt Nurse Communication: Mobility status;Other (comment) (HR)  Activity Tolerance: Patient tolerated treatment well Patient left: in bed;with call bell/phone within reach;with nursing/sitter in room  OT Visit Diagnosis: Other abnormalities of gait and mobility (R26.89);Repeated falls (R29.6);Muscle weakness (generalized) (M62.81)                Time: 1101-1150 OT Time Calculation (min): 49 min Charges:  OT General Charges $OT Visit: 1 Visit OT Evaluation $OT Eval Low Complexity: 1 Low OT Treatments $Self Care/Home Management : 8-22 mins $Therapeutic Activity: 8-22 mins  Arman Filter., MPH, MS, OTR/L ascom [phone] 04/04/23, 12:56 PM

## 2023-04-04 NOTE — Progress Notes (Signed)
OT reported patient ortho positive with HR increased to 150s upon starting up. Will increase IVF for 24 hours

## 2023-04-05 DIAGNOSIS — Z681 Body mass index (BMI) 19 or less, adult: Secondary | ICD-10-CM | POA: Diagnosis not present

## 2023-04-05 DIAGNOSIS — E7800 Pure hypercholesterolemia, unspecified: Secondary | ICD-10-CM | POA: Diagnosis present

## 2023-04-05 DIAGNOSIS — E860 Dehydration: Secondary | ICD-10-CM | POA: Diagnosis present

## 2023-04-05 DIAGNOSIS — N390 Urinary tract infection, site not specified: Secondary | ICD-10-CM | POA: Diagnosis present

## 2023-04-05 DIAGNOSIS — E876 Hypokalemia: Secondary | ICD-10-CM | POA: Diagnosis not present

## 2023-04-05 DIAGNOSIS — R531 Weakness: Secondary | ICD-10-CM | POA: Diagnosis not present

## 2023-04-05 DIAGNOSIS — E870 Hyperosmolality and hypernatremia: Secondary | ICD-10-CM | POA: Diagnosis not present

## 2023-04-05 DIAGNOSIS — Z66 Do not resuscitate: Secondary | ICD-10-CM | POA: Diagnosis present

## 2023-04-05 DIAGNOSIS — Z79899 Other long term (current) drug therapy: Secondary | ICD-10-CM | POA: Diagnosis not present

## 2023-04-05 DIAGNOSIS — B9620 Unspecified Escherichia coli [E. coli] as the cause of diseases classified elsewhere: Secondary | ICD-10-CM | POA: Diagnosis present

## 2023-04-05 DIAGNOSIS — Z7984 Long term (current) use of oral hypoglycemic drugs: Secondary | ICD-10-CM | POA: Diagnosis not present

## 2023-04-05 DIAGNOSIS — Z7982 Long term (current) use of aspirin: Secondary | ICD-10-CM | POA: Diagnosis not present

## 2023-04-05 DIAGNOSIS — L89152 Pressure ulcer of sacral region, stage 2: Secondary | ICD-10-CM | POA: Diagnosis present

## 2023-04-05 DIAGNOSIS — Z888 Allergy status to other drugs, medicaments and biological substances status: Secondary | ICD-10-CM | POA: Diagnosis not present

## 2023-04-05 DIAGNOSIS — N3001 Acute cystitis with hematuria: Secondary | ICD-10-CM | POA: Diagnosis present

## 2023-04-05 DIAGNOSIS — I69320 Aphasia following cerebral infarction: Secondary | ICD-10-CM | POA: Diagnosis not present

## 2023-04-05 DIAGNOSIS — I10 Essential (primary) hypertension: Secondary | ICD-10-CM | POA: Diagnosis present

## 2023-04-05 DIAGNOSIS — N179 Acute kidney failure, unspecified: Secondary | ICD-10-CM | POA: Diagnosis present

## 2023-04-05 DIAGNOSIS — E43 Unspecified severe protein-calorie malnutrition: Secondary | ICD-10-CM | POA: Diagnosis present

## 2023-04-05 DIAGNOSIS — Z87891 Personal history of nicotine dependence: Secondary | ICD-10-CM | POA: Diagnosis not present

## 2023-04-05 DIAGNOSIS — Z886 Allergy status to analgesic agent status: Secondary | ICD-10-CM | POA: Diagnosis not present

## 2023-04-05 DIAGNOSIS — Z993 Dependence on wheelchair: Secondary | ICD-10-CM | POA: Diagnosis not present

## 2023-04-05 DIAGNOSIS — Z91148 Patient's other noncompliance with medication regimen for other reason: Secondary | ICD-10-CM | POA: Diagnosis not present

## 2023-04-05 DIAGNOSIS — E1165 Type 2 diabetes mellitus with hyperglycemia: Secondary | ICD-10-CM | POA: Diagnosis present

## 2023-04-05 DIAGNOSIS — R296 Repeated falls: Secondary | ICD-10-CM | POA: Diagnosis present

## 2023-04-05 LAB — BASIC METABOLIC PANEL
Anion gap: 9 (ref 5–15)
BUN: 30 mg/dL — ABNORMAL HIGH (ref 8–23)
CO2: 22 mmol/L (ref 22–32)
Calcium: 8.3 mg/dL — ABNORMAL LOW (ref 8.9–10.3)
Chloride: 116 mmol/L — ABNORMAL HIGH (ref 98–111)
Creatinine, Ser: 0.8 mg/dL (ref 0.44–1.00)
GFR, Estimated: >60 mL/min (ref >60)
Glucose, Bld: 158 mg/dL — ABNORMAL HIGH (ref 70–99)
Potassium: 3.3 mmol/L — ABNORMAL LOW (ref 3.5–5.1)
Sodium: 147 mmol/L — ABNORMAL HIGH (ref 135–145)

## 2023-04-05 LAB — GLUCOSE, CAPILLARY
Glucose-Capillary: 107 mg/dL — ABNORMAL HIGH (ref 70–99)
Glucose-Capillary: 83 mg/dL (ref 70–99)

## 2023-04-05 LAB — CBG MONITORING, ED
Glucose-Capillary: 143 mg/dL — ABNORMAL HIGH (ref 70–99)
Glucose-Capillary: 135 mg/dL — ABNORMAL HIGH (ref 70–99)
Glucose-Capillary: 197 mg/dL — ABNORMAL HIGH (ref 70–99)

## 2023-04-05 MED ORDER — SODIUM CHLORIDE 0.9 % IV SOLN: INTRAVENOUS | Status: DC

## 2023-04-05 MED ORDER — POTASSIUM CHLORIDE 20 MEQ PO PACK
40.0000 meq | PACK | Freq: Once | ORAL | Status: AC
  Administered 2023-04-05: 40 meq via ORAL
  Filled 2023-04-05: qty 2

## 2023-04-05 MED ORDER — SODIUM CHLORIDE 0.9 % IV SOLN
INTRAVENOUS | Status: DC
Start: 2023-04-05 — End: 2023-04-06

## 2023-04-05 NOTE — Plan of Care (Signed)
  Problem: Education: Goal: Ability to describe self-care measures that may prevent or decrease complications (Diabetes Survival Skills Education) will improve Outcome: Progressing Goal: Individualized Educational Video(s) Outcome: Progressing   Problem: Coping: Goal: Ability to adjust to condition or change in health will improve Outcome: Progressing   

## 2023-04-05 NOTE — ED Notes (Signed)
Warm blanket given to patients family

## 2023-04-05 NOTE — ED Notes (Signed)
VS WNL, pt denies needs at this time, denies incontinence. Pending inpt bed.

## 2023-04-05 NOTE — Progress Notes (Signed)
PROGRESS NOTE   HPI was taken from Dr. Chipper Herb: Susan [name] is a 89 y.o. 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   Symptoms started 5 days ago, patient started floor frequent episodes of nausea no vomiting no diarrhea no abdominal pain, but patient did have significant decrease of p.o. intake including food and water and became dehydrated.  She also had a unwitnessed fall at home 2 to 3 days ago and hit her head, patient reported feeling generalized weakness and lightheaded, but denied any LOC, no blurry vision.  At baseline she is able to transfer herself from wheelchair to bed but because she has become so weak she has not been able to do that.  Denies any cough, no fever or chills.  She has not been taking her medication regularly for the last few days.   ED Course: Blood pressure elevated 150/60, afebrile, not tachycardia nonhypoxic.  CT head and neck negative for acute findings chest x-ray negative for acute infiltrates.  Blood work showed AKI creatinine 1.2 compared to baseline 0.7, glucose 449, BUN 56, K3.8, WBC 14.  UA showed WBC 21-50.   Patient was given IV bolus 1000 x 1 and ceftriaxone x 1.    Susan [name]  XBJ:[id number] DOB: 19-Oct-1926 DOA: 04/04/2023 PCP: [name], MD    Assessment & Plan:   Principal Problem:   AKI (acute kidney injury) (HCC) Active Problems:   UTI (urinary tract infection)  Assessment and Plan: AKI: likely secondary to poor oral intake and UTI. Continue onI IVFs   UTI: urine cx growing e. coli. Continue on IV rocephin    Generalized weakness: pt is wheelchair bound as per pt. CT head shows no acute intracranial finding   Hypernatremia: encourage free water intake    Hx of CVA: continue on aspirin, statin    HTN: continue on amlodipine. Holding hydrochlorothiazide, ACE-I secondary  to AKI   DM2: likely poorly controlled. Continue on SSI w/ accuchecks   Hypokalemia: potassium given     DVT prophylaxis: heparin  Code Status: full  Family Communication: discussed pt's care w/ pt's granddaughter, Toni Amend, but was told to call the son to give all updates too  Disposition Plan: depends on PT/OT recs   Level of care: Telemetry Medical  Status is: Inpatient Remains inpatient appropriate because: severity of illness    Consultants:    Procedures:   Antimicrobials: rocephin   Subjective: Pt c/o fatigue   Objective: Vitals:   04/05/23 0600 04/05/23 0630 04/05/23 0700 04/05/23 0730  BP: (!) 151/57 (!) 160/54 (!) 155/58 (!) 157/60  Pulse: 69 65 71 (!) 39  Resp: (!) 22 20 (!) 21 (!) 21  Temp:    97.9 F (36.6 C)  TempSrc:    Oral  SpO2: 97% 93% 100% 97%  Weight:      Height:        Intake/Output Summary (Last 24 hours) at 04/05/2023 0918 Last data filed at 04/04/2023 2122 Gross per 24 hour  Intake 844.72 ml  Output --  Net 844.72 ml   Filed Weights   04/04/23 0053  Weight: 50.8 kg    Examination:  General exam: Appears calm and comfortable  Respiratory system: Clear to auscultation. Respiratory effort normal. Cardiovascular system: S1 & S2+. No  rubs, gallops or clicks.  Gastrointestinal system: Abdomen is nondistended, soft and nontender Normal bowel  sounds heard. Central nervous system: Alert and oriented. Moves all extremities  Psychiatry: Judgement and insight appears at baseline. Mood & affect appropriate.     Data Reviewed: I have personally reviewed following labs and imaging studies  CBC: Recent Labs  Lab 04/04/23 0058  WBC 14.7*  HGB 12.5  HCT 40.1  MCV 86.8  PLT 318   Basic Metabolic Panel: Recent Labs  Lab 04/04/23 0058 04/05/23 0434  NA 144 147*  K 3.8 3.3*  CL 108 116*  CO2 27 22  GLUCOSE 449* 158*  BUN 56* 30*  CREATININE 1.26* 0.80  CALCIUM 9.4 8.3*   GFR: Estimated Creatinine Clearance: 34.5 mL/min  (by C-G formula based on SCr of 0.8 mg/dL). Liver Function Tests: No results for input(s): "AST", "ALT", "ALKPHOS", "BILITOT", "PROT", "ALBUMIN" in the last 168 hours. No results for input(s): "LIPASE", "AMYLASE" in the last 168 hours. No results for input(s): "AMMONIA" in the last 168 hours. Coagulation Profile: No results for input(s): "INR", "PROTIME" in the last 168 hours. Cardiac Enzymes: Recent Labs  Lab 04/04/23 0058  CKTOTAL 51   BNP (last 3 results) No results for input(s): "PROBNP" in the last 8760 hours. HbA1C: Recent Labs    04/04/23 1254  HGBA1C 11.6*   CBG: Recent Labs  Lab 04/04/23 0051 04/04/23 1139 04/04/23 1641 04/05/23 0440 04/05/23 0719  GLUCAP 427* 297* 272* 143* 135*   Lipid Profile: No results for input(s): "CHOL", "HDL", "LDLCALC", "TRIG", "CHOLHDL", "LDLDIRECT" in the last 72 hours. Thyroid Function Tests: No results for input(s): "TSH", "T4TOTAL", "FREET4", "T3FREE", "THYROIDAB" in the last 72 hours. Anemia Panel: No results for input(s): "VITAMINB12", "FOLATE", "FERRITIN", "TIBC", "IRON", "RETICCTPCT" in the last 72 hours. Sepsis Labs: No results for input(s): "PROCALCITON", "LATICACIDVEN" in the last 168 hours.  Recent Results (from the past 240 hours)  Resp panel by RT-PCR (RSV, Flu A&B, Covid) Anterior Nasal Swab     Status: None   Collection Time: 04/04/23  5:51 AM   Specimen: Anterior Nasal Swab  Result Value Ref Range Status   SARS Coronavirus 2 by RT PCR NEGATIVE NEGATIVE Final    Comment: (NOTE) SARS-CoV-2 target nucleic acids are NOT DETECTED.  The SARS-CoV-2 RNA is generally detectable in upper respiratory specimens during the acute phase of infection. The lowest concentration of SARS-CoV-2 viral copies this assay can detect is 138 copies/mL. A negative result does not preclude SARS-Cov-2 infection and should not be used as the sole basis for treatment or other patient management decisions. A negative result may occur with   improper specimen collection/handling, submission of specimen other than nasopharyngeal swab, presence of viral mutation(s) within the areas targeted by this assay, and inadequate number of viral copies(<138 copies/mL). A negative result must be combined with clinical observations, patient history, and epidemiological information. The expected result is Negative.  Fact Sheet for Patients:  BloggerCourse.com  Fact Sheet for Healthcare Providers:  SeriousBroker.it  This test is no t yet approved or cleared by the [place] FDA and  has been authorized for detection and/or diagnosis of SARS-CoV-2 by FDA under an Emergency Use Authorization (EUA). This EUA will remain  in effect (meaning this test can be used) for the duration of the COVID-19 declaration under Section 564(b)(1) of the Act, 21 U.S.C.section 360bbb-3(b)(1), unless the authorization is terminated  or revoked sooner.       Influenza A by PCR NEGATIVE NEGATIVE Final   Influenza B by PCR NEGATIVE NEGATIVE Final    Comment: (NOTE) The Xpert Xpress  SARS-CoV-2/FLU/RSV plus assay is intended as an aid in the diagnosis of influenza from Nasopharyngeal swab specimens and should not be used as a sole basis for treatment. Nasal washings and aspirates are unacceptable for Xpert Xpress SARS-CoV-2/FLU/RSV testing.  Fact Sheet for Patients: BloggerCourse.com  Fact Sheet for Healthcare Providers: SeriousBroker.it  This test is not yet approved or cleared by the [place] FDA and has been authorized for detection and/or diagnosis of SARS-CoV-2 by FDA under an Emergency Use Authorization (EUA). This EUA will remain in effect (meaning this test can be used) for the duration of the COVID-19 declaration under Section 564(b)(1) of the Act, 21 U.S.C. section 360bbb-3(b)(1), unless the authorization is terminated or revoked.      Resp Syncytial Virus by PCR NEGATIVE NEGATIVE Final    Comment: (NOTE) Fact Sheet for Patients: BloggerCourse.com  Fact Sheet for Healthcare Providers: SeriousBroker.it  This test is not yet approved or cleared by the [place] FDA and has been authorized for detection and/or diagnosis of SARS-CoV-2 by FDA under an Emergency Use Authorization (EUA). This EUA will remain in effect (meaning this test can be used) for the duration of the COVID-19 declaration under Section 564(b)(1) of the Act, 21 U.S.C. section 360bbb-3(b)(1), unless the authorization is terminated or revoked.  Performed at [facility], [address] [place], [place] [address]          Radiology Studies: [facility] Chest [place] 1 View Result Date: 04/04/2023 CLINICAL DATA:  Weakness.  Leukocytosis.  Evaluate for infection. EXAM: PORTABLE CHEST 1 VIEW COMPARISON:  02/22/2022 FINDINGS: Normal cardiomediastinal contours. The lungs are hyperinflated. No pleural effusion, interstitial edema or airspace disease. Chronic left posterolateral rib fracture deformities as noted previously. IMPRESSION: 1. No acute findings. Electronically Signed   By: Signa Kell M.D.   On: 04/04/2023 06:18   CT HEAD WO CONTRAST Result Date: 04/04/2023 CLINICAL DATA:  Slurred speech EXAM: CT HEAD WITHOUT CONTRAST TECHNIQUE: Contiguous axial images were obtained from the base of the skull through the vertex without intravenous contrast. RADIATION DOSE REDUCTION: This exam was performed according to the departmental dose-optimization program which includes automated exposure control, adjustment of the mA and/or kV according to patient size and/or use of iterative reconstruction technique. COMPARISON:  02/22/2022 FINDINGS: Brain: There is no mass, hemorrhage or extra-axial collection. There is generalized atrophy without lobar predilection. Hypodensity of the white matter is most commonly  associated with chronic microvascular disease. Vascular: Atherosclerotic calcification of the vertebral and internal carotid arteries at the skull base. No abnormal hyperdensity of the major intracranial arteries or dural venous sinuses. Skull: The visualized skull base, calvarium and extracranial soft tissues are normal. Sinuses/Orbits: No fluid levels or advanced mucosal thickening of the visualized paranasal sinuses. No mastoid or middle ear effusion. Normal orbits. Other: None. IMPRESSION: 1. No acute intracranial abnormality. 2. Generalized atrophy and findings of chronic microvascular disease. Electronically Signed   By: Deatra Robinson M.D.   On: 04/04/2023 02:12        Scheduled Meds:  amLODipine  10 mg Oral QAC supper   aspirin EC  81 mg Oral Daily   atorvastatin  20 mg Oral QPC supper   heparin  5,000 Units Subcutaneous Q12H   insulin aspart  0-9 Units Subcutaneous TID WC   Continuous Infusions:  sodium chloride 100 mL/hr at 04/05/23 0255   cefTRIAXone (ROCEPHIN)  IV       LOS: 0 days        Charise Killian, MD [facility]  Pager [phone]  If 7PM-7AM, please contact night-coverage [email or web address] 04/05/2023, 9:18 AM

## 2023-04-05 NOTE — Evaluation (Signed)
Physical Therapy Evaluation Patient Details Name: Susan [name] MRN: [id number] DOB: 12-30-1926 Today's Date: 04/05/2023  History of Present Illness  89 y.o. 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   Clinical Impression  Pt admitted with above diagnosis. Pt currently with functional limitations due to the deficits listed below (see PT Problem List). Pt received upright in bed agreeable to PT. Reports PTA pt lives alone at ILF and is mod-I with w/c mobility and transfers. Gets PRN assist from family for IADL's.   To date, pt is able to complete supine to sit with supervision. Bed saturated with urine noted in sitting. Pt is tremulous in sitting reliant on feet on floor and PRN SUE/BUE support for steadying. With set up assist pt is able to don shoes. PT education on body set up and hand placement for transfer to [facility]. MinA for squat pivot to <> from [facility]. Pt able to continently void B/B. Increased time to complete pericare and also to provide cuing for appropriate peri care/ hand hygiene. Supervision to CGA acquired from PT for pt anterior trunk lean for pericare throughout due to tremors. Pt returns to supine in bed with supervision and all needs in reach. Due to tremors, need for assist for transfers due to acute weakness, and recent falls, PT placing f/u recs to address these deficits once discharged to maximize safety and independence to return to PLOF.             If plan is discharge home, recommend the following: A little help with walking and/or transfers;A little help with bathing/dressing/bathroom;Assistance with cooking/housework;Direct supervision/assist for financial management;Assist for transportation;Help with stairs or ramp for entrance   Can travel by private vehicle   No    Equipment Recommendations Other  (comment) (TBD by next venue of care)  Recommendations for Other Services       Functional Status Assessment Patient has had a recent decline in their functional status and demonstrates the ability to make significant improvements in function in a reasonable and predictable amount of time.     Precautions / Restrictions Precautions Precautions: Fall Restrictions Weight Bearing Restrictions Per Provider Order: No      Mobility  Bed Mobility Overal bed mobility: Needs Assistance Bed Mobility: Supine to Sit, Sit to Supine     Supine to sit: Contact guard, HOB elevated Sit to supine: Supervision     Patient Response: Cooperative  Transfers Overall transfer level: Needs assistance Equipment used: 1 person hand held assist Transfers: Bed to chair/wheelchair/BSC       Squat pivot transfers: Min assist     General transfer comment: Required VC's for body positioning and hand placement prior to transfers.    Ambulation/Gait               General Gait Details: Pt does not ambulate at baseline.  Stairs            Wheelchair Mobility     Tilt Bed Tilt Bed Patient Response: Cooperative  Modified Rankin (Stroke Patients Only)       Balance Overall balance assessment: Needs assistance Sitting-balance support: No upper extremity supported, Feet supported Sitting balance-Leahy Scale: Good Sitting balance - Comments: Tremulous in sitting. Does demo ability to lean anteriorly on BSC to wipe buttocks.   Standing balance support: Single extremity supported, Reliant on assistive device for balance Standing balance-Leahy  Scale: Poor Standing balance comment: primarily squat pivots at baseline.                             Pertinent Vitals/Pain Pain Assessment Pain Assessment: No/denies pain    Home Living Family/patient expects to be discharged to:: Private residence (ILF) Living Arrangements: Alone Available Help at Discharge: Family;Available  PRN/intermittently Type of Home: Apartment Home Access: Elevator       Home Layout: One level Home Equipment: BSC/3in1;Wheelchair - manual;Shower seat      Prior Function Prior Level of Function : History of Falls (last six months);Needs assist             Mobility Comments: Pt reports mod-I with w/c       Extremity/Trunk Assessment   Upper Extremity Assessment Upper Extremity Assessment: Generalized weakness    Lower Extremity Assessment Lower Extremity Assessment: Generalized weakness       Communication   Communication Cueing Techniques: Verbal cues;Tactile cues  Cognition Arousal: Alert Behavior During Therapy: WFL for tasks assessed/performed Overall Cognitive Status: No family/caregiver present to determine baseline cognitive functioning                                          General Comments      Exercises     Assessment/Plan    PT Assessment Patient needs continued PT services  PT Problem List Decreased strength;Decreased safety awareness;Decreased balance;Decreased mobility       PT Treatment Interventions DME instruction;Neuromuscular re-education;Stair training;Patient/family education;Functional mobility training;Wheelchair mobility training;Therapeutic activities;Therapeutic exercise;Balance training    PT Goals (Current goals can be found in the Care Plan section)  Acute Rehab PT Goals Patient Stated Goal: improve mobility PT Goal Formulation: With patient Time For Goal Achievement: 04/19/23 Potential to Achieve Goals: Good    Frequency Min 1X/week     Co-evaluation               AM-PAC PT "6 Clicks" Mobility  Outcome Measure Help needed turning from your back to your side while in a flat bed without using bedrails?: A Little Help needed moving from lying on your back to sitting on the side of a flat bed without using bedrails?: A Little Help needed moving to and from a bed to a chair (including a  wheelchair)?: A Little Help needed standing up from a chair using your arms (e.g., wheelchair or bedside chair)?: A Lot Help needed to walk in hospital room?: Total Help needed climbing 3-5 steps with a railing? : Total 6 Click Score: 13    End of Session Equipment Utilized During Treatment: Gait belt Activity Tolerance: Patient tolerated treatment well Patient left: in bed;with call bell/phone within reach Nurse Communication: Mobility status PT Visit Diagnosis: Other abnormalities of gait and mobility (R26.89);History of falling (Z91.81);Muscle weakness (generalized) (M62.81)    Time: 1610-9604 PT Time Calculation (min) (ACUTE ONLY): 26 min   Charges:   PT Evaluation $PT Eval Low Complexity: 1 Low PT Treatments $Therapeutic Activity: 8-22 mins PT General Charges $$ ACUTE PT VISIT: 1 Visit       Delphia Grates. Fairly IV, PT, DPT Physical Therapist- [place]  [facility]  04/05/2023, 11:13 AM

## 2023-04-05 NOTE — ED Notes (Signed)
Morning labs collected, given water per request. Given new gown/blankets and repositioned. Pt is dry w/ purewick in place for urinary incontinence. BG and VS WNL. Pending inpt bed.

## 2023-04-05 NOTE — ED Notes (Signed)
Advised nurse that patient has ready bed 

## 2023-04-05 NOTE — Care Management Obs Status (Signed)
MEDICARE OBSERVATION STATUS NOTIFICATION   Patient Details  Name: Susan [name] MRN: [id number] Date of Birth: 1926-09-08   Medicare Observation Status Notification Given:  Yes    [name], LCSWA 04/05/2023, 10:48 AM

## 2023-04-06 ENCOUNTER — Ambulatory Visit: Payer: Medicare Other | Admitting: Occupational Therapy

## 2023-04-06 ENCOUNTER — Telehealth (HOSPITAL_COMMUNITY): Payer: Self-pay | Admitting: Pharmacy Technician

## 2023-04-06 ENCOUNTER — Other Ambulatory Visit (HOSPITAL_COMMUNITY): Payer: Self-pay

## 2023-04-06 DIAGNOSIS — Z8673 Personal history of transient ischemic attack (TIA), and cerebral infarction without residual deficits: Secondary | ICD-10-CM

## 2023-04-06 DIAGNOSIS — E1165 Type 2 diabetes mellitus with hyperglycemia: Secondary | ICD-10-CM | POA: Diagnosis not present

## 2023-04-06 DIAGNOSIS — N3001 Acute cystitis with hematuria: Secondary | ICD-10-CM

## 2023-04-06 DIAGNOSIS — I10 Essential (primary) hypertension: Secondary | ICD-10-CM

## 2023-04-06 DIAGNOSIS — E870 Hyperosmolality and hypernatremia: Secondary | ICD-10-CM

## 2023-04-06 DIAGNOSIS — N179 Acute kidney failure, unspecified: Secondary | ICD-10-CM | POA: Diagnosis not present

## 2023-04-06 DIAGNOSIS — R531 Weakness: Secondary | ICD-10-CM

## 2023-04-06 LAB — GLUCOSE, CAPILLARY
Glucose-Capillary: 78 mg/dL (ref 70–99)
Glucose-Capillary: 92 mg/dL (ref 70–99)
Glucose-Capillary: 385 mg/dL — ABNORMAL HIGH (ref 70–99)
Glucose-Capillary: 417 mg/dL — ABNORMAL HIGH (ref 70–99)
Glucose-Capillary: 349 mg/dL — ABNORMAL HIGH (ref 70–99)
Glucose-Capillary: 140 mg/dL — ABNORMAL HIGH (ref 70–99)

## 2023-04-06 MED ORDER — POTASSIUM CHLORIDE CRYS ER 20 MEQ PO TBCR
20.0000 meq | EXTENDED_RELEASE_TABLET | Freq: Once | ORAL | Status: AC
  Administered 2023-04-06: 20 meq via ORAL
  Filled 2023-04-06: qty 1

## 2023-04-06 MED ORDER — LINAGLIPTIN 5 MG PO TABS
5.0000 mg | ORAL_TABLET | Freq: Every day | ORAL | Status: DC
  Administered 2023-04-06 – 2023-04-14 (×9): 5 mg via ORAL
  Filled 2023-04-06 (×9): qty 1

## 2023-04-06 NOTE — Progress Notes (Signed)
  Progress Note   Patient: Susan [name]:[id number] DOB: 11-Dec-1926 DOA: 04/04/2023     1 DOS: the patient was seen and examined on 04/06/2023   Brief hospital course: 89 year old female past medical history of stroke, chronic aphasia, weakness and ataxia.  Mostly wheelchair-bound, hypertension, hyperlipidemia brought in for weakness malaise and fall.  Patient found to have acute kidney injury and urinary infection.  12/23.  Physical therapy recommending rehab.  Creatinine improved to 0.80.  Sugars were low but now high.  Last hemoglobin A1c 11.6.  Assessment and Plan: * AKI (acute kidney injury) (HCC) Creatinine improved from 1.26 down to 0.8.  Discontinue IV fluid.  Acute cystitis with hematuria Urine culture not sent off.  On Rocephin.  Generalized weakness Physical therapy recommending rehab  Hypernatremia Patient eating better today  Uncontrolled type 2 diabetes mellitus with hyperglycemia, without long-term current use of insulin (HCC) Start Tradjenta 5 mg daily with sugars in the 400s today.  Continue sliding scale.  History of CVA (cerebrovascular accident) On aspirin and Lipitor  Essential hypertension On Norvasc.  Holding hydrochlorothiazide and lisinopril.        Subjective: Patient feeling okay.  She stated she choked on a little food this morning.  Will get speech therapy evaluation.  Still feeling weak.  Eating better.  Sugars starting to rise.  Admitted with weakness and found to have acute kidney injury and UTI.  Physical Exam: Vitals:   04/05/23 1400 04/05/23 1456 04/05/23 2336 04/06/23 0808  BP: (!) 152/58 (!) 151/62 (!) 127/54 (!) 158/68  Pulse: 91 68 66 83  Resp: (!) 26 18 18 18   Temp:  98.8 F (37.1 C) 97.8 F (36.6 C) 98 F (36.7 C)  TempSrc:      SpO2: 99% 95% 97% 100%  Weight:      Height:       Physical Exam HENT:     Head: Normocephalic.     Mouth/Throat:     Pharynx: No oropharyngeal exudate.  Eyes:     General: Lids are normal.      Conjunctiva/sclera: Conjunctivae normal.  Cardiovascular:     Rate and Rhythm: Normal rate and regular rhythm.     Heart sounds: Normal heart sounds, S1 normal and S2 normal.  Pulmonary:     Breath sounds: No decreased breath sounds, wheezing, rhonchi or rales.  Abdominal:     Palpations: Abdomen is soft.     Tenderness: There is no abdominal tenderness.  Musculoskeletal:     Right lower leg: No swelling.     Left lower leg: No swelling.  Skin:    General: Skin is warm.     Findings: No rash.  Neurological:     Mental Status: She is alert.     Data Reviewed: Na 147, k 3.3 cr 0.8  Family Communication: spoke with son on the phone  Disposition: Status is: Inpatient Remains inpatient appropriate because: TOC to look into rehabs  Planned Discharge Destination: Rehab    Time spent: 28 minutes  Author: Alford Highland, MD 04/06/2023 1:52 PM  For on call review [email or web address].

## 2023-04-06 NOTE — Assessment & Plan Note (Signed)
Sodium in the normal range.  Patient states she ate better today.

## 2023-04-06 NOTE — Assessment & Plan Note (Signed)
Started Tradjenta 5 mg daily.  Continue sliding scale and 3 units of insulin prior to meals.  Upon going home the patient will not be able to do insulin and will only be able to do oral medications.

## 2023-04-06 NOTE — Assessment & Plan Note (Signed)
On Norvasc.  Holding hydrochlorothiazide and lisinopril.

## 2023-04-06 NOTE — Telephone Encounter (Signed)
Patient Product/process development scientist completed.    The patient is insured through [employer]. Patient has Medicare and is not eligible for a copay card, but may be able to apply for patient assistance, if available.    Ran test claim for Januvia 100 mg and the current 30 day co-pay is $4.50.  Ran test claim for Tradjenta 5 mg and the current 30 day co-pay is $4.50.  Ran test claim for saxagliptin (Onglyza) 5  mg and the current 30 day co-pay is $1.50.  This test claim was processed through [facility]- copay amounts may vary at other pharmacies due to pharmacy/plan contracts, or as the patient moves through the different stages of their insurance plan.     Roland Earl, CPHT Pharmacy Technician III Certified Patient Advocate  [facility] Patient Advocate Team Direct Number: [phone]  Fax: [phone]

## 2023-04-06 NOTE — Assessment & Plan Note (Signed)
Physical therapy recommending rehab 

## 2023-04-06 NOTE — Inpatient Diabetes Management (Addendum)
Inpatient Diabetes Program Recommendations  AACE/ADA: New Consensus Statement on Inpatient Glycemic Control (2015)  Target Ranges:  Prepandial:   less than 140 mg/dL      Peak postprandial:   less than 180 mg/dL (1-2 hours)      Critically ill patients:  140 - 180 mg/dL   Lab Results  Component Value Date   GLUCAP 78 04/06/2023   HGBA1C 11.6 (H) 04/04/2023    Review of Glycemic Control  Diabetes history: DM 2 Outpatient Diabetes medications: Glipizide 5 mg bid, Jardiance 10 mg Daily, Metformin 500 mg bid.  Current orders for Inpatient glycemic control:  Novolog 0-9 units tid  A1c 11.6% on 12/21  Inpatient Diabetes Program Recommendations:    Glucose trends sensitive and responsive to Novolog sensitive correction only  --- start Tradjenta 5 mg Daily (could use in place of Jardiance at time of d/c)  Pt per family report has not been right for the last week. Pt forgetting medications and not eating like normal.   Will speak with patient/family  Addendum spoke with pt at bedside and granddaughter, Toni Amend : Pt lives alone with 3 grandchildren checking on her multiple days of the week.  Pt takes 3 different pills at home for glucose. Pt eats regularly at home and at baseline granddaughter reports pt takes medications consistently.   I was originally thinking pt needed additional medication but due to UTI and pt confusion over the past week and trends running high, would recommend continuing home meds as normal except the Jardiance medication which can make pt prone to genitourinary infections.   Thanks,  Christena Deem RN, MSN, BC-ADM Inpatient Diabetes Coordinator Team Pager [phone] (8a-5p)

## 2023-04-06 NOTE — Evaluation (Signed)
Clinical/Bedside Swallow Evaluation Patient Details  Name: Susan [name] MRN: [id number] Date of Birth: 1927/01/10  Today's Date: 04/06/2023 Time: SLP Start Time (ACUTE ONLY): 1255 SLP Stop Time (ACUTE ONLY): 1355 SLP Time Calculation (min) (ACUTE ONLY): 60 min  Past Medical History:  Past Medical History:  Diagnosis Date   Anemia    Balance problem    Diabetes mellitus without complication (HCC)    Diverticulosis    Heart murmur    Hypercholesteremia    Hypertension    Stroke [facility])    TIA 2006   TIA (transient ischemic attack) 2006   Past Surgical History:  Past Surgical History:  Procedure Laterality Date   CATARACT EXTRACTION W/PHACO Left 05/22/2015   Procedure: CATARACT EXTRACTION PHACO AND INTRAOCULAR LENS PLACEMENT (IOC);  Surgeon: Galen Manila, MD;  Location: [facility] ORS;  Service: Ophthalmology;  Laterality: Left;  [place]: 01:07.9 AP%: 23.0 CDE: 15.62 Lot# [id number]   CATARACT EXTRACTION W/PHACO Right 06/12/2015   Procedure: CATARACT EXTRACTION PHACO AND INTRAOCULAR LENS PLACEMENT (IOC);  Surgeon: Galen Manila, MD;  Location: [facility] ORS;  Service: Ophthalmology;  Laterality: Right;  [place] 00:57 AP% 17.1 CDE 9.86 fluid pack lot # [id number]   COLON SURGERY     resection   COLONOSCOPY WITH PROPOFOL N/A 10/29/2021   Procedure: COLONOSCOPY WITH PROPOFOL;  Surgeon: Regis Bill, MD;  Location: [facility] ENDOSCOPY;  Service: Endoscopy;  Laterality: N/A;   ECTOPIC PREGNANCY SURGERY     HERNIA REPAIR     TONSILLECTOMY     HPI:  Pt is a 89 year old female past medical history of Uncontrolled type 2 diabetes mellitus with hyperglycemia, stroke, ataxia, weakness.  Mostly wheelchair-bound, hypertension, hyperlipidemia brought in for weakness malaise and fall.  Patient found to have acute kidney injury and urinary infection.  Physical therapy recommending rehab.    Head CT: No acute intracranial abnormality.  2. Generalized atrophy and findings of chronic microvascular  disease.   CXR: No  acute findings.    Assessment / Plan / Recommendation  Clinical Impression   Pt seen for BSE today s/p "choking on bacon" this morning at her breakfast meal; no difficulty eating/swallowing the pancakes and juice. Pt awake, verbally conversive w/ min slower deliberate speech. Pt w/ involuntary tremors/motor movements baseline. She followed commands w/ cue.   On RA, afebrile.   Pt appears to present w/ grossly functional oropharyngeal phase swallowing w/ No overt oropharyngeal phase dysphagia noted, No neuromuscular deficits noted. Pt consumed po trials w/ No overt, clinical s/s of aspiration during po trials. Min increased Time for oral phase mastication/bolus clearing in setting of Dentures and advanced age; involuntary tremors.  Pt appears at reduced risk for aspiration following general aspiration precautions and choosing foods that are easy to chew; less dry and particulate, for ease of chewing in setting of age/tremors and Dentures. However, pt does have factors that could impact her oropharyngeal swallowing to include deconditioning/weakness, full Dentures/care, involuntary Tremors, and advanced age. These factors can increase risk for aspiration, dysphagia as well as decreased oral intake overall.   During po trials, pt consumed all consistencies w/ no overt coughing, decline in vocal quality, or change in respiratory presentation during/post trials. O2 sats remained in the upper 90s. Oral phase appeared grossly [facility] w/ bolus management, mastication, and control of bolus propulsion for A-P transfer for swallowing. Min increased mastication Time w/ increased, dryer solids such as breads -- discussed ways to moisten foods, CHOP small for easier chewing. Pt stated that certain foods "took a lot  more time to chew". Oral clearing achieved w/ all trial consistencies -- moistened, soft foods given. OM Exam appeared [facility] w/ no unilateral weakness noted. Speech Clear. Pt fed self w/ setup support.    Recommend a more Mech Soft consistency diet w/ well-CHOPPED meats, moistened foods; Thin liquids -- carefully monitor straw use for Small sips Slowly. Recommend general aspiration precautions, Reflux precautions. Tray setup at meals; Sitting up support d/t tremors. Easy to chew foods vs particulate, dry, and chewy foods in setting of Dentures and age/tremors.  Recommend Family/Staff monitor food consistencies for easy to chew/eat foods vs those that are not as easy in setting of Dentures/advanced age/tremors. Pills WHOLE vs CRUSHED in Puree for safer, easier swallowing -- pt has done this in the past, and it was encourged for D/C then.  Education given on Pills in Puree; food consistencies and easy to eat options; food prep and choices -- avoid Problematic foods. Education given on general aspiration and Reflux precautions to pt. Pt appears at/near her swallowing baseline. No further skilled ST services indicated. MD/NSG to reconsult if any new needs arise during admit. MD/NSG updated, agreed. Recommend Dietician f/u for support. SLP Visit Diagnosis: Dysphagia, unspecified (R13.10) (functional -- min increased time during the oral phase in setting of tremors, Dentures)    Aspiration Risk  Mild aspiration risk;Risk for inadequate nutrition/hydration (reduced when following general aspiration precautions, Reflux precs.)    Diet Recommendation   Thin;Dysphagia 3 (mechanical soft) (w/ gravies added) = a more Mech Soft consistency diet w/ well-CHOPPED meats, moistened foods; Thin liquids -- carefully monitor straw use for Small sips Slowly. Recommend general aspiration precautions, Reflux precautions. Tray setup at meals; Sitting up support d/t tremors. Easy to chew foods vs particulate, dry, and chewy foods in setting of Dentures and age.  Medication Administration: Whole meds with puree (vs Crushed in Puree - Baseline)    Other  Recommendations Recommended Consults: Consider GI evaluation (Dietician  f/u) Oral Care Recommendations: Oral care BID;Staff/trained caregiver to provide oral care (Denture care)    Recommendations for follow up therapy are one component of a multi-disciplinary discharge planning process, led by the attending physician.  Recommendations may be updated based on patient status, additional functional criteria and insurance authorization.  Follow up Recommendations No SLP follow up      Assistance Recommended at Discharge  Intermittent for setup and support  Functional Status Assessment Patient has not had a recent decline in their functional status  Frequency and Duration  (n/a)   (n/a)       Prognosis Prognosis for improved oropharyngeal function: Good Barriers to Reach Goals: Time post onset;Severity of deficits Barriers/Prognosis Comment: monitor food consistencies for easy to chew/eat foods vs those that are not as easy in setting of Dentures/advanced age/tremors      Swallow Study   General Date of Onset: 04/04/23 HPI: Pt is a 89 year old female past medical history of Uncontrolled type 2 diabetes mellitus with hyperglycemia, stroke, ataxia, weakness.  Mostly wheelchair-bound, hypertension, hyperlipidemia brought in for weakness malaise and fall.  Patient found to have acute kidney injury and urinary infection.  Physical therapy recommending rehab.    Head CT: No acute intracranial abnormality.  2. Generalized atrophy and findings of chronic microvascular  disease.   CXR: No acute findings. Type of Study: Bedside Swallow Evaluation Previous Swallow Assessment: mbss 2016 - no deficits. regular diet, thins Diet Prior to this Study: Regular;Thin liquids (Level 0) (per MD) Temperature Spikes Noted: No Respiratory Status: Room air  History of Recent Intubation: No Behavior/Cognition: Alert;Cooperative;Pleasant mood;Distractible;Requires cueing (intermittent) Oral Cavity Assessment: Within Functional Limits Oral Care Completed by SLP: Recent completion by  staff Oral Cavity - Dentition: Dentures, top;Dentures, bottom (in place) Vision: Functional for self-feeding Self-Feeding Abilities: Able to feed self;Needs assist;Needs set up (involuntary tremors) Patient Positioning: Upright in bed (needed support for upright sitting) Baseline Vocal Quality: Normal;Low vocal intensity Volitional Cough: Strong Volitional Swallow: Able to elicit    Oral/Motor/Sensory Function Overall Oral Motor/Sensory Function: Within functional limits   Ice Chips Ice chips: Not tested   Thin Liquid Thin Liquid: Within functional limits Presentation: Self Fed;Straw (~8 ozs total) Other Comments: water, CB juice    Nectar Thick Nectar Thick Liquid: Not tested   Honey Thick Honey Thick Liquid: Not tested   Puree Puree: Within functional limits Presentation: Self Fed;Spoon (4 ozs)   Solid     Solid: Impaired (slight-min) Presentation: Self Fed;Spoon (10+ trials) Oral Phase Impairments: Impaired mastication (w/ sandwich (bread)) Pharyngeal Phase Impairments:  (none) Other Comments: w/ Time she masticated/cleared the boluses -- moistening foods was best        Jerilynn Som, MS, CCC-SLP Speech Language Pathologist Rehab Services; [facility] [phone] (ascom) Eletha Culbertson 04/06/2023,3:05 PM

## 2023-04-06 NOTE — Assessment & Plan Note (Signed)
Urine culture not sent off.  On Rocephin.

## 2023-04-06 NOTE — Hospital Course (Signed)
89 year old female past medical history of stroke, chronic aphasia, weakness and ataxia.  Mostly wheelchair-bound, hypertension, hyperlipidemia brought in for weakness malaise and fall.  Patient found to have acute kidney injury and urinary infection.  12/23.  Physical therapy recommending rehab.  Creatinine improved to 0.80.  Sugars were low but now high.  Last hemoglobin A1c 11.6. 12/24.  Patient feels okay.  Offers no complaints. 12/25.  Patient eating better than when she came in.

## 2023-04-06 NOTE — Assessment & Plan Note (Signed)
Creatinine improved from 1.26 down to 0.8.  Discontinue IV fluid.

## 2023-04-06 NOTE — Plan of Care (Signed)
  Problem: Education: Goal: Ability to describe self-care measures that may prevent or decrease complications (Diabetes Survival Skills Education) will improve Outcome: Progressing Goal: Individualized Educational Video(s) Outcome: Progressing   

## 2023-04-06 NOTE — Assessment & Plan Note (Signed)
On aspirin and Lipitor

## 2023-04-07 DIAGNOSIS — R531 Weakness: Secondary | ICD-10-CM | POA: Diagnosis not present

## 2023-04-07 DIAGNOSIS — E870 Hyperosmolality and hypernatremia: Secondary | ICD-10-CM | POA: Diagnosis not present

## 2023-04-07 DIAGNOSIS — N3001 Acute cystitis with hematuria: Secondary | ICD-10-CM | POA: Diagnosis not present

## 2023-04-07 DIAGNOSIS — N179 Acute kidney failure, unspecified: Secondary | ICD-10-CM | POA: Diagnosis not present

## 2023-04-07 LAB — BASIC METABOLIC PANEL
Anion gap: 8 (ref 5–15)
BUN: 13 mg/dL (ref 8–23)
CO2: 26 mmol/L (ref 22–32)
Calcium: 8.4 mg/dL — ABNORMAL LOW (ref 8.9–10.3)
Chloride: 107 mmol/L (ref 98–111)
Creatinine, Ser: 0.75 mg/dL (ref 0.44–1.00)
GFR, Estimated: >60 mL/min (ref >60)
Glucose, Bld: 162 mg/dL — ABNORMAL HIGH (ref 70–99)
Potassium: 3.7 mmol/L (ref 3.5–5.1)
Sodium: 141 mmol/L (ref 135–145)

## 2023-04-07 LAB — CBC
HCT: 39.7 % (ref 36.0–46.0)
Hemoglobin: 12.6 g/dL (ref 12.0–15.0)
MCH: 26.6 pg (ref 26.0–34.0)
MCHC: 31.7 g/dL (ref 30.0–36.0)
MCV: 83.8 fL (ref 80.0–100.0)
Platelets: 270 10*3/uL (ref 150–400)
RBC: 4.74 MIL/uL (ref 3.87–5.11)
RDW: 14 % (ref 11.5–15.5)
WBC: 8.2 10*3/uL (ref 4.0–10.5)
nRBC: 0 % (ref 0.0–0.2)

## 2023-04-07 LAB — GLUCOSE, CAPILLARY
Glucose-Capillary: 127 mg/dL — ABNORMAL HIGH (ref 70–99)
Glucose-Capillary: 273 mg/dL — ABNORMAL HIGH (ref 70–99)
Glucose-Capillary: 232 mg/dL — ABNORMAL HIGH (ref 70–99)
Glucose-Capillary: 223 mg/dL — ABNORMAL HIGH (ref 70–99)

## 2023-04-07 MED ORDER — MAGNESIUM HYDROXIDE 400 MG/5ML PO SUSP
30.0000 mL | Freq: Every day | ORAL | Status: DC | PRN
  Administered 2023-04-07: 30 mL via ORAL
  Filled 2023-04-07: qty 30

## 2023-04-07 MED ORDER — LISINOPRIL 20 MG PO TABS
20.0000 mg | ORAL_TABLET | Freq: Every day | ORAL | Status: DC
  Administered 2023-04-07 – 2023-04-14 (×8): 20 mg via ORAL
  Filled 2023-04-07 (×8): qty 1

## 2023-04-07 NOTE — Progress Notes (Signed)
  Progress Note   Patient: Susan [name]:[id number] DOB: 04-Nov-1926 DOA: 04/04/2023     2 DOS: the patient was seen and examined on 04/07/2023   Brief hospital course: 89 year old female past medical history of stroke, chronic aphasia, weakness and ataxia.  Mostly wheelchair-bound, hypertension, hyperlipidemia brought in for weakness malaise and fall.  Patient found to have acute kidney injury and urinary infection.  12/23.  Physical therapy recommending rehab.  Creatinine improved to 0.80.  Sugars were low but now high.  Last hemoglobin A1c 11.6. 12/24.  Patient feels okay.  Offers no complaints.  Assessment and Plan: * AKI (acute kidney injury) (HCC) Creatinine improved from 1.26 down to 0.75.    Acute cystitis with hematuria Urine culture not sent off.  On Rocephin.  Generalized weakness Physical therapy recommending rehab  Hypernatremia Sodium in the normal range.  Patient states she ate better today.  Uncontrolled type 2 diabetes mellitus with hyperglycemia, without long-term current use of insulin (HCC) Started Tradjenta 5 mg daily with sugars in the 400s yesterday.  Continue sliding scale.  History of CVA (cerebrovascular accident) On aspirin and Lipitor  Essential hypertension On Norvasc.  Add back lisinopril with blood pressure being high today.        Subjective: Patient feels okay.  Offers no complaints.  Admitted with acute kidney injury and urinary tract infection.  Physical Exam: Vitals:   04/06/23 0808 04/06/23 1624 04/06/23 2249 04/07/23 0832  BP: (!) 158/68 (!) 152/58 (!) 140/59 (!) 174/67  Pulse: 83 84 69 74  Resp: 18 16 18 17   Temp: 98 F (36.7 C) 98.5 F (36.9 C) 98.8 F (37.1 C) 98.2 F (36.8 C)  TempSrc:  Oral    SpO2: 100% 98% 98% 99%  Weight:      Height:       Physical Exam HENT:     Head: Normocephalic.     Mouth/Throat:     Pharynx: No oropharyngeal exudate.  Eyes:     General: Lids are normal.     Conjunctiva/sclera:  Conjunctivae normal.  Cardiovascular:     Rate and Rhythm: Normal rate and regular rhythm.     Heart sounds: Normal heart sounds, S1 normal and S2 normal.  Pulmonary:     Breath sounds: No decreased breath sounds, wheezing, rhonchi or rales.  Abdominal:     Palpations: Abdomen is soft.     Tenderness: There is no abdominal tenderness.  Musculoskeletal:     Right lower leg: No swelling.     Left lower leg: No swelling.  Skin:    General: Skin is warm.     Findings: No rash.  Neurological:     Mental Status: She is alert.     Data Reviewed: Creatinine 0.75, hemoglobin 12.6, platelet count 270, white blood cell count 8.2  Family Communication: Left message for son  Disposition: Status is: Inpatient Remains inpatient appropriate because: We do not have a rehab bed yet  Planned Discharge Destination: Rehab    Time spent: 28 minutes  Author: Alford Highland, MD 04/07/2023 2:17 PM  For on call review [email or web address].

## 2023-04-07 NOTE — Plan of Care (Signed)
  Problem: Nutritional: Goal: Maintenance of adequate nutrition will improve Outcome: Progressing Goal: Progress toward achieving an optimal weight will improve Outcome: Progressing   Problem: Skin Integrity: Goal: Risk for impaired skin integrity will decrease Outcome: Progressing   Problem: Tissue Perfusion: Goal: Adequacy of tissue perfusion will improve Outcome: Progressing

## 2023-04-07 NOTE — Progress Notes (Signed)
Physical Therapy Treatment Patient Details Name: Susan [name] MRN: [id number] DOB: May 26, 1926 Today's Date: 04/07/2023   History of Present Illness 89 y.o. 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    PT Comments  Patient is in bed upon PT arrival, reports she has wet herself and the bed. Performs bed mobility with min A for rolling and safety in bed for bedding change and gown change. Patient refused OOB activity but agreed to supine level activities. LE strengthening tolerated well with occasional rest breaks and cueing for task orientation. Current POC remains appropriate at this time.     If plan is discharge home, recommend the following: A little help with walking and/or transfers;A little help with bathing/dressing/bathroom;Assistance with cooking/housework;Direct supervision/assist for financial management;Assist for transportation;Help with stairs or ramp for entrance   Can travel by private vehicle     No  Equipment Recommendations  Other (comment)    Recommendations for Other Services       Precautions / Restrictions Precautions Precautions: Fall Restrictions Weight Bearing Restrictions Per Provider Order: No     Mobility  Bed Mobility Overal bed mobility: Needs Assistance Bed Mobility: Rolling Rolling: Min assist, Used rails         General bed mobility comments: Patient requires min A for rolling R and L    Transfers                        Ambulation/Gait                   Stairs             Wheelchair Mobility     Tilt Bed    Modified Rankin (Stroke Patients Only)       Balance       Sitting balance - Comments: not performed this session, see previous session note       Standing balance comment: primarily squat pivots at baseline.                             Cognition Arousal: Alert Behavior During Therapy: WFL for tasks assessed/performed Overall Cognitive Status: Impaired/Different from baseline Area of Impairment: Memory                     Memory: Decreased short-term memory         General Comments: Decreased recall of recent falls in the past 2 weeks, but alert and oriented x4, follows all commands        Exercises General Exercises - Lower Extremity Ankle Circles/Pumps: Strengthening, Both, 10 reps, Supine Quad Sets: Strengthening, Both, 10 reps, Supine Heel Slides: Strengthening, Both, AROM, Supine Other Exercises Other Exercises: Cleaning of bed and patient performed with patient assistance, bed level activities    General Comments General comments (skin integrity, edema, etc.): very frail      Pertinent Vitals/Pain Pain Assessment Pain Assessment: No/denies pain    Home Living                          Prior Function            PT Goals (current goals can now be found in the care plan section) Acute Rehab PT Goals Patient Stated Goal: improve  mobility PT Goal Formulation: With patient Time For Goal Achievement: 04/19/23 Potential to Achieve Goals: Good Progress towards PT goals: Progressing toward goals    Frequency    Min 1X/week      PT Plan      Co-evaluation              AM-PAC PT "6 Clicks" Mobility   Outcome Measure  Help needed turning from your back to your side while in a flat bed without using bedrails?: A Little Help needed moving from lying on your back to sitting on the side of a flat bed without using bedrails?: A Little Help needed moving to and from a bed to a chair (including a wheelchair)?: A Little Help needed standing up from a chair using your arms (e.g., wheelchair or bedside chair)?: A Lot Help needed to walk in hospital room?: Total Help needed climbing 3-5 steps with a railing? : Total 6 Click Score: 13    End of  Session   Activity Tolerance: Patient limited by fatigue;Other (comment) (declined to get EOB this session) Patient left: in bed;with call bell/phone within reach Nurse Communication: Mobility status PT Visit Diagnosis: Other abnormalities of gait and mobility (R26.89);History of falling (Z91.81);Muscle weakness (generalized) (M62.81)     Time: 9562-1308 PT Time Calculation (min) (ACUTE ONLY): 13 min  Charges:    $Therapeutic Activity: 8-22 mins PT General Charges $$ ACUTE PT VISIT: 1 Visit                     Precious Bard, PT, DPT Physical Therapist - [place]   04/07/2023, 1:51 PM

## 2023-04-07 NOTE — Plan of Care (Signed)
  Problem: Education: Goal: Ability to describe self-care measures that may prevent or decrease complications (Diabetes Survival Skills Education) will improve Outcome: Progressing Goal: Individualized Educational Video(s) Outcome: Progressing   Problem: Coping: Goal: Ability to adjust to condition or change in health will improve Outcome: Progressing   Problem: Fluid Volume: Goal: Ability to maintain a balanced intake and output will improve Outcome: Progressing   Problem: Health Behavior/Discharge Planning: Goal: Ability to identify and utilize available resources and services will improve Outcome: Progressing Goal: Ability to manage health-related needs will improve Outcome: Progressing   Problem: Metabolic: Goal: Ability to maintain appropriate glucose levels will improve Outcome: Progressing   Problem: Nutritional: Goal: Maintenance of adequate nutrition will improve Outcome: Progressing Goal: Progress toward achieving an optimal weight will improve Outcome: Progressing   Problem: Skin Integrity: Goal: Risk for impaired skin integrity will decrease Outcome: Progressing   Problem: Tissue Perfusion: Goal: Adequacy of tissue perfusion will improve Outcome: Progressing   Problem: Education: Goal: Knowledge of General Education information will improve Description: Including pain rating scale, medication(s)/side effects and non-pharmacologic comfort measures Outcome: Progressing   Problem: Health Behavior/Discharge Planning: Goal: Ability to manage health-related needs will improve Outcome: Progressing   Problem: Clinical Measurements: Goal: Ability to maintain clinical measurements within normal limits will improve Outcome: Progressing Goal: Will remain free from infection Outcome: Progressing Goal: Diagnostic test results will improve Outcome: Progressing Goal: Respiratory complications will improve Outcome: Progressing Goal: Cardiovascular complication will  be avoided Outcome: Progressing   Problem: Nutrition: Goal: Adequate nutrition will be maintained Outcome: Progressing   Problem: Activity: Goal: Risk for activity intolerance will decrease Outcome: Progressing   Problem: Coping: Goal: Level of anxiety will decrease Outcome: Progressing   Problem: Elimination: Goal: Will not experience complications related to bowel motility Outcome: Progressing Goal: Will not experience complications related to urinary retention Outcome: Progressing   Problem: Pain Management: Goal: General experience of comfort will improve Outcome: Progressing   Problem: Safety: Goal: Ability to remain free from injury will improve Outcome: Progressing   Problem: Skin Integrity: Goal: Risk for impaired skin integrity will decrease Outcome: Progressing

## 2023-04-08 DIAGNOSIS — N3001 Acute cystitis with hematuria: Secondary | ICD-10-CM | POA: Diagnosis not present

## 2023-04-08 DIAGNOSIS — R531 Weakness: Secondary | ICD-10-CM | POA: Diagnosis not present

## 2023-04-08 DIAGNOSIS — E870 Hyperosmolality and hypernatremia: Secondary | ICD-10-CM | POA: Diagnosis not present

## 2023-04-08 DIAGNOSIS — N179 Acute kidney failure, unspecified: Secondary | ICD-10-CM | POA: Diagnosis not present

## 2023-04-08 LAB — GLUCOSE, CAPILLARY
Glucose-Capillary: 206 mg/dL — ABNORMAL HIGH (ref 70–99)
Glucose-Capillary: 220 mg/dL — ABNORMAL HIGH (ref 70–99)
Glucose-Capillary: 355 mg/dL — ABNORMAL HIGH (ref 70–99)
Glucose-Capillary: 243 mg/dL — ABNORMAL HIGH (ref 70–99)

## 2023-04-08 NOTE — Progress Notes (Signed)
  Progress Note   Patient: Susan [name] UVO:[id number] DOB: 06/26/1926 DOA: 04/04/2023     3 DOS: the patient was seen and examined on 04/08/2023   Brief hospital course: 89 year old female past medical history of stroke, chronic aphasia, weakness and ataxia.  Mostly wheelchair-bound, hypertension, hyperlipidemia brought in for weakness malaise and fall.  Patient found to have acute kidney injury and urinary infection.  12/23.  Physical therapy recommending rehab.  Creatinine improved to 0.80.  Sugars were low but now high.  Last hemoglobin A1c 11.6. 12/24.  Patient feels okay.  Offers no complaints.  Assessment and Plan: * AKI (acute kidney injury) (HCC) Creatinine improved from 1.26 down to 0.75.    Acute cystitis with hematuria Urine culture not sent off.  Completed Rocephin.  Generalized weakness Physical therapy recommending rehab  Hypernatremia Sodium in the normal range.  Patient states she ate better today.  Uncontrolled type 2 diabetes mellitus with hyperglycemia, without long-term current use of insulin (HCC) Started Tradjenta 5 mg daily with sugars in the 400s yesterday.  Continue sliding scale.  History of CVA (cerebrovascular accident) On aspirin and Lipitor  Essential hypertension On Norvasc.  Add back lisinopril with blood pressure being high today.        Subjective: Patient feels okay.  Offers no complaints.  States she is eating better than when she first came in.  Admitted with feeling weak and a fall and found to have acute kidney injury and a urinary infection.  Physical Exam: Vitals:   04/07/23 1533 04/07/23 2117 04/08/23 0848 04/08/23 0925  BP: (!) 142/64 (!) 109/51 (!) 153/72 (!) 153/72  Pulse: 92 77 76   Resp:  18 16   Temp:  98.7 F (37.1 C) 98.5 F (36.9 C)   TempSrc:   Oral   SpO2:  97% 100%   Weight:      Height:       Physical Exam HENT:     Head: Normocephalic.     Mouth/Throat:     Pharynx: No oropharyngeal exudate.  Eyes:      General: Lids are normal.     Conjunctiva/sclera: Conjunctivae normal.  Cardiovascular:     Rate and Rhythm: Normal rate and regular rhythm.     Heart sounds: Normal heart sounds, S1 normal and S2 normal.  Pulmonary:     Breath sounds: No decreased breath sounds, wheezing, rhonchi or rales.  Abdominal:     Palpations: Abdomen is soft.     Tenderness: There is no abdominal tenderness.  Musculoskeletal:     Right lower leg: No swelling.     Left lower leg: No swelling.  Skin:    General: Skin is warm.     Findings: No rash.  Neurological:     Mental Status: She is alert.     Data Reviewed: Today's glucose 206 and 220   Disposition: Status is: Inpatient Remains inpatient appropriate because: Waiting for rehab bed.  Medically stable  Planned Discharge Destination: Rehab    Time spent: 27 minutes  Author: Alford Highland, MD 04/08/2023 3:13 PM  For on call review [email or web address].

## 2023-04-08 NOTE — Plan of Care (Signed)
  Problem: Skin Integrity: Goal: Risk for impaired skin integrity will decrease Outcome: Progressing   Problem: Elimination: Goal: Will not experience complications related to bowel motility Outcome: Progressing   Problem: Nutrition: Goal: Adequate nutrition will be maintained Outcome: Progressing   Problem: Activity: Goal: Risk for activity intolerance will decrease Outcome: Progressing   Problem: Pain Management: Goal: General experience of comfort will improve Outcome: Progressing   Problem: Coping: Goal: Level of anxiety will decrease Outcome: Progressing

## 2023-04-09 DIAGNOSIS — N3001 Acute cystitis with hematuria: Secondary | ICD-10-CM | POA: Diagnosis not present

## 2023-04-09 DIAGNOSIS — E870 Hyperosmolality and hypernatremia: Secondary | ICD-10-CM | POA: Diagnosis not present

## 2023-04-09 DIAGNOSIS — Z681 Body mass index (BMI) 19 or less, adult: Secondary | ICD-10-CM

## 2023-04-09 DIAGNOSIS — N179 Acute kidney failure, unspecified: Secondary | ICD-10-CM | POA: Diagnosis not present

## 2023-04-09 DIAGNOSIS — R636 Underweight: Secondary | ICD-10-CM

## 2023-04-09 DIAGNOSIS — R531 Weakness: Secondary | ICD-10-CM | POA: Diagnosis not present

## 2023-04-09 LAB — GLUCOSE, CAPILLARY
Glucose-Capillary: 149 mg/dL — ABNORMAL HIGH (ref 70–99)
Glucose-Capillary: 333 mg/dL — ABNORMAL HIGH (ref 70–99)
Glucose-Capillary: 350 mg/dL — ABNORMAL HIGH (ref 70–99)
Glucose-Capillary: 247 mg/dL — ABNORMAL HIGH (ref 70–99)

## 2023-04-09 MED ORDER — INSULIN ASPART 100 UNIT/ML IJ SOLN
3.0000 [IU] | Freq: Three times a day (TID) | INTRAMUSCULAR | Status: DC
  Administered 2023-04-09 – 2023-04-14 (×15): 3 [IU] via SUBCUTANEOUS
  Filled 2023-04-09 (×14): qty 1

## 2023-04-09 MED ORDER — DOCUSATE SODIUM 100 MG PO CAPS
100.0000 mg | ORAL_CAPSULE | Freq: Two times a day (BID) | ORAL | Status: DC
  Administered 2023-04-09 – 2023-04-14 (×10): 100 mg via ORAL
  Filled 2023-04-09 (×10): qty 1

## 2023-04-09 NOTE — Progress Notes (Signed)
Occupational Therapy Treatment Patient Details Name: Susan [name] MRN: [id number] DOB: 02/25/1927 Today's Date: 04/09/2023   History of present illness 89 y.o. 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   OT comments  Pt seen for OT treatment on this date focusing on improving tolerance to activity, seated dynamic balance for ADLs and grooming task. Pt requires CGA for bed mobility, intermittent minA for seated balance EOB during oral care, and setup - minA for rinsing dentures. Pt making good progress toward goals, will continue to follow POC. Discharge recommendation remains appropriate. Patient will benefit from continued inpatient follow up therapy, <3 hours/day       If plan is discharge home, recommend the following:  A little help with walking and/or transfers;A little help with bathing/dressing/bathroom;Assistance with cooking/housework;Assist for transportation;Help with stairs or ramp for entrance;Direct supervision/assist for medications management;Supervision due to cognitive status;Direct supervision/assist for financial management   Equipment Recommendations  Other (comment)    Recommendations for Other Services      Precautions / Restrictions Precautions Precautions: Fall Restrictions Weight Bearing Restrictions Per Provider Order: No       Mobility Bed Mobility Overal bed mobility: Needs Assistance Bed Mobility: Sit to Supine, Supine to Sit     Supine to sit: Contact guard, HOB elevated Sit to supine: Contact guard assist        Transfers                   General transfer comment: NT     Balance Overall balance assessment: Needs assistance Sitting-balance support: Feet supported, Single extremity supported Sitting balance-Leahy Scale: Fair Sitting balance - Comments: sitting EOB to  perform oral care Postural control: Right lateral lean                                 ADL either performed or assessed with clinical judgement   ADL Overall ADL's : Needs assistance/impaired     Grooming: Oral care;Set up;Sitting Grooming Details (indicate cue type and reason): occ minA for sitting support                               General ADL Comments: Pt agreeable to OT session, requests to work on sitting EOB for oral care. 1 lateral LOB requiring minA to correct, overall with BLE and unilateral UE support, sits EOB ~8 mins to complete task      Cognition Arousal: Alert Behavior During Therapy: WFL for tasks assessed/performed Overall Cognitive Status: Impaired/Different from baseline Area of Impairment: Memory                     Memory: Decreased short-term memory         General Comments: Decreased recall of recent falls in the past 2 weeks, but alert and oriented x4, follows all commands                   Pertinent Vitals/ Pain       Pain Assessment Pain Assessment: No/denies pain   Frequency  Min 1X/week        Progress Toward Goals  OT Goals(current goals can now be found in the care plan section)  Progress towards OT goals: Progressing toward goals  Acute Rehab OT Goals OT Goal Formulation: With patient/family Time For Goal Achievement: 04/18/23 Potential to Achieve Goals: Good  Plan         AM-PAC OT "6 Clicks" Daily Activity     Outcome Measure   Help from another person eating meals?: None Help from another person taking care of personal grooming?: A Little Help from another person toileting, which includes using toliet, bedpan, or urinal?: A Little Help from another person bathing (including washing, rinsing, drying)?: A Little Help from another person to put on and taking off regular upper body clothing?: A Little Help from another person to put on and taking off regular lower body clothing?: A  Little 6 Click Score: 19    End of Session    OT Visit Diagnosis: Other abnormalities of gait and mobility (R26.89);Repeated falls (R29.6);Muscle weakness (generalized) (M62.81)   Activity Tolerance Patient tolerated treatment well   Patient Left in bed;with call bell/phone within reach;with nursing/sitter in room   Nurse Communication Mobility status;Other (comment)        Time: 1610-9604 OT Time Calculation (min): 19 min  Charges: OT General Charges $OT Visit: 1 Visit OT Treatments $Self Care/Home Management : 8-22 mins  Maryori Weide L. Adelfa Lozito, OTR/L  04/09/23, 4:13 PM

## 2023-04-09 NOTE — NC FL2 (Cosign Needed)
[place] MEDICAID FL2 LEVEL OF CARE FORM     IDENTIFICATION  Patient Name: Susan [name] Birthdate: [birth year] Sex: female Admission Date (Current Location): 04/04/2023  [facility] [place] Number:  Chiropodist and Address:  [facility], [address], [place], [place] [address]      Provider Number: [id number]  Attending Physician Name and Address:  Alford Highland, MD  Relative Name and Phone Number:  Madie Reno son [phone]    Current Level of Care: Hospital Recommended Level of Care: Skilled Nursing Facility Prior Approval Number:    Date Approved/Denied:   PASRR Number: [id number]  Discharge Plan: SNF    Current Diagnoses: Patient Active Problem List   Diagnosis Date Noted   Acute cystitis with hematuria 04/06/2023   Generalized weakness 04/06/2023   Hypernatremia 04/06/2023   Uncontrolled type 2 diabetes mellitus with hyperglycemia, without long-term current use of insulin (HCC) 04/06/2023   History of CVA (cerebrovascular accident) 04/06/2023   Essential hypertension 04/06/2023   AKI (acute kidney injury) (HCC) 04/04/2023   Renal lesion, right 10/30/2021   Acute blood loss anemia 10/30/2021   Lower GI bleed 10/27/2021   GI bleeding 05/29/2021   CVA (cerebral vascular accident) (HCC) 07/04/2017    Orientation RESPIRATION BLADDER Height & Weight     Self, Time, Situation, Place  Normal Continent Weight: 50.8 kg Height:  5\' 6"  (167.6 cm)  BEHAVIORAL SYMPTOMS/MOOD NEUROLOGICAL BOWEL NUTRITION STATUS      Continent    AMBULATORY STATUS COMMUNICATION OF NEEDS Skin   Extensive Assist Verbally Normal                       Personal Care Assistance Level of Assistance  Bathing, Feeding, Dressing Bathing Assistance: Limited assistance Feeding assistance: Limited assistance Dressing Assistance: Maximum assistance     Functional Limitations Info  Sight, Hearing, Speech Sight Info: Adequate Hearing Info:  Adequate Speech Info: Adequate    SPECIAL CARE FACTORS FREQUENCY  PT (By licensed PT), OT (By licensed OT)     PT Frequency: 5 times per week OT Frequency: 5 times per week            Contractures Contractures Info: Not present    Additional Factors Info  Code Status, Allergies Code Status Info: DNR Allergies Info: naproxen, Atenelol           Current Medications (04/09/2023):  This is the current hospital active medication list Current Facility-Administered Medications  Medication Dose Route Frequency Provider Last Rate Last Admin   acetaminophen (TYLENOL) tablet 650 mg  650 mg Oral Q6H PRN Mikey College T, MD       Or   acetaminophen (TYLENOL) suppository 650 mg  650 mg Rectal Q6H PRN Mikey College T, MD       amLODipine (NORVASC) tablet 10 mg  10 mg Oral QAC supper Mikey College T, MD   10 mg at 04/08/23 1751   aspirin EC tablet 81 mg  81 mg Oral Daily Mikey College T, MD   81 mg at 04/09/23 0941   atorvastatin (LIPITOR) tablet 20 mg  20 mg Oral QPC supper Mikey College T, MD   20 mg at 04/08/23 1751   docusate sodium (COLACE) capsule 100 mg  100 mg Oral BID Alford Highland, MD   100 mg at 04/09/23 1230   heparin injection 5,000 Units  5,000 Units Subcutaneous Q12H Emeline General, MD   5,000 Units at 04/09/23 0942   hydrALAZINE (APRESOLINE)  injection 5 mg  5 mg Intravenous Q6H PRN Mikey College T, MD   5 mg at 04/04/23 1315   insulin aspart (novoLOG) injection 0-9 Units  0-9 Units Subcutaneous TID WC Mikey College T, MD   7 Units at 04/09/23 1230   insulin aspart (novoLOG) injection 3 Units  3 Units Subcutaneous TID WC Wieting, Richard, MD       linagliptin (TRADJENTA) tablet 5 mg  5 mg Oral Daily Alford Highland, MD   5 mg at 04/09/23 0941   lisinopril (ZESTRIL) tablet 20 mg  20 mg Oral Daily Alford Highland, MD   20 mg at 04/09/23 0941   magnesium hydroxide (MILK OF MAGNESIA) suspension 30 mL  30 mL Oral Daily PRN Alford Highland, MD   30 mL at 04/07/23 1220   ondansetron  (ZOFRAN) tablet 4 mg  4 mg Oral Q6H PRN Emeline General, MD       Or   ondansetron [facility]) injection 4 mg  4 mg Intravenous Q6H PRN Emeline General, MD         Discharge Medications: Please see discharge summary for a list of discharge medications.  Relevant Imaging Results:  Relevant Lab Results:   Additional Information SS# [id number]  Marlowe Sax, RN

## 2023-04-09 NOTE — Care Management Important Message (Signed)
Important Message  Patient Details  Name: Susan [name] MRN: [id number] Date of Birth: Mar 11, 1927   Important Message Given:  Yes - Medicare IM     Sherilyn Banker 04/09/2023, 12:21 PM

## 2023-04-09 NOTE — Inpatient Diabetes Management (Signed)
Inpatient Diabetes Program Recommendations  AACE/ADA: New Consensus Statement on Inpatient Glycemic Control   Target Ranges:  Prepandial:   less than 140 mg/dL      Peak postprandial:   less than 180 mg/dL (1-2 hours)      Critically ill patients:  140 - 180 mg/dL    Latest Reference Range & Units 04/08/23 08:44 04/08/23 11:15 04/08/23 17:31 04/08/23 21:29 04/09/23 08:32  Glucose-Capillary 70 - 99 mg/dL 161 (H) 096 (H) 045 (H) 243 (H) 149 (H)   Review of Glycemic Control  Diabetes history: DM2 Outpatient Diabetes medications: Glipizide 5 mg BID, Jardiance 10 mg daily, Metformin 500 mg BID Current orders for Inpatient glycemic control: Tradjenta 5 mg daily, Novolog 0-9 units TID with meals  Inpatient Diabetes Program Recommendations:    Insulin: Please consider ordering Novolog 3 units TID with meals for meal coverage if patient eats at least 50% of meals.  Outpatient DM: Due to risk of genitourinary infections with Jardiance, may want to consider discontinuing medication at discharge.  Thanks, Orlando Penner, RN, MSN, CDCES Diabetes Coordinator Inpatient Diabetes Program [phone] (Team Pager from 8am to 5pm)

## 2023-04-09 NOTE — TOC Progression Note (Signed)
Transition of Care [facility]) - Progression Note    Patient Details  Name: ASRA MCWETHY MRN: [id number] Date of Birth: 26-Nov-1926  Transition of Care [facility]) CM/SW Contact  Marlowe Sax, RN Phone Number: 04/09/2023, 3:18 PM  Clinical Narrative:     The patient has no bed offers yet, expanded the bed search, will review bed offers once obtained       Expected Discharge Plan and Services                                               Social Determinants of Health (SDOH) Interventions SDOH Screenings   Food Insecurity: No Food Insecurity (04/05/2023)  Housing: Low Risk  (04/05/2023)  Transportation Needs: No Transportation Needs (04/05/2023)  Utilities: Not At Risk (04/05/2023)  Tobacco Use: Medium Risk (04/04/2023)    Readmission Risk Interventions     No data to display

## 2023-04-09 NOTE — Progress Notes (Signed)
  Progress Note   Patient: Susan [name] [id number]:[id number] DOB: 09/26/1926 DOA: 04/04/2023     4 DOS: the patient was seen and examined on 04/09/2023   Brief hospital course: 89 year old female past medical history of stroke, chronic aphasia, weakness and ataxia.  Mostly wheelchair-bound, hypertension, hyperlipidemia brought in for weakness malaise and fall.  Patient found to have acute kidney injury and urinary infection.  12/23.  Physical therapy recommending rehab.  Creatinine improved to 0.80.  Sugars were low but now high.  Last hemoglobin A1c 11.6. 12/24.  Patient feels okay.  Offers no complaints. 12/25.  Patient eating better than when she came in.  Assessment and Plan: * AKI (acute kidney injury) (HCC) Creatinine improved from 1.26 down to 0.75.    Acute cystitis with hematuria Urine culture not sent off.  Completed Rocephin.  Generalized weakness Physical therapy recommending rehab  Hypernatremia Sodium in the normal range.  Patient states she ate better today.  Uncontrolled type 2 diabetes mellitus with hyperglycemia, without long-term current use of insulin (HCC) Started Tradjenta 5 mg daily.  Continue sliding scale and 3 units of insulin prior to meals.  Upon going home the patient will not be able to do insulin and will only be able to do oral medications.  History of CVA (cerebrovascular accident) On aspirin and Lipitor  Essential hypertension On Norvasc and lisinopril  Underweight (BMI < 18.5) BMI 18.08        Subjective: Patient feels well.  Eating okay.  Offers no complaints.  No burning on urination.  Admitted with weakness and a fall at home and found to have urinary tract infection and acute kidney injury.  Physical Exam: Vitals:   04/08/23 1607 04/08/23 2128 04/09/23 0834 04/09/23 1527  BP: (!) 122/51 (!) 117/51 (!) 137/55 (!) 137/51  Pulse: 74 69 72 83  Resp: 17  18 18   Temp: 98.7 F (37.1 C)  98.5 F (36.9 C) 98.7 F (37.1 C)  TempSrc: Oral   Oral Oral  SpO2: 97% 97% 99% 99%  Weight:      Height:       Physical Exam HENT:     Head: Normocephalic.     Mouth/Throat:     Pharynx: No oropharyngeal exudate.  Eyes:     General: Lids are normal.     Conjunctiva/sclera: Conjunctivae normal.  Cardiovascular:     Rate and Rhythm: Normal rate and regular rhythm.     Heart sounds: Normal heart sounds, S1 normal and S2 normal.  Pulmonary:     Breath sounds: No decreased breath sounds, wheezing, rhonchi or rales.  Abdominal:     Palpations: Abdomen is soft.     Tenderness: There is no abdominal tenderness.  Musculoskeletal:     Right lower leg: No swelling.     Left lower leg: No swelling.  Skin:    General: Skin is warm.     Findings: No rash.  Neurological:     Mental Status: She is alert.     Data Reviewed: Creatinine 0.75 potassium 3.7 Family Communication: Spoke with son on the phone  Disposition: Status is: Inpatient We do not have a bed offer yet.  Medically stable for discharge.  Planned Discharge Destination: Rehab    Time spent: 28 minutes  Author: Alford Highland, MD 04/09/2023 5:38 PM  For on call review [email or web address].

## 2023-04-09 NOTE — Plan of Care (Signed)

## 2023-04-09 NOTE — Assessment & Plan Note (Signed)
BMI 18.08

## 2023-04-10 DIAGNOSIS — N179 Acute kidney failure, unspecified: Secondary | ICD-10-CM | POA: Diagnosis not present

## 2023-04-10 LAB — CBC
HCT: 35.5 % — ABNORMAL LOW (ref 36.0–46.0)
Hemoglobin: 11.2 g/dL — ABNORMAL LOW (ref 12.0–15.0)
MCH: 26.9 pg (ref 26.0–34.0)
MCHC: 31.5 g/dL (ref 30.0–36.0)
MCV: 85.1 fL (ref 80.0–100.0)
Platelets: 235 10*3/uL (ref 150–400)
RBC: 4.17 MIL/uL (ref 3.87–5.11)
RDW: 14.1 % (ref 11.5–15.5)
WBC: 7.8 10*3/uL (ref 4.0–10.5)
nRBC: 0 % (ref 0.0–0.2)

## 2023-04-10 LAB — BASIC METABOLIC PANEL
Anion gap: 10 (ref 5–15)
BUN: 21 mg/dL (ref 8–23)
CO2: 21 mmol/L — ABNORMAL LOW (ref 22–32)
Calcium: 8 mg/dL — ABNORMAL LOW (ref 8.9–10.3)
Chloride: 105 mmol/L (ref 98–111)
Creatinine, Ser: 0.81 mg/dL (ref 0.44–1.00)
GFR, Estimated: >60 mL/min (ref >60)
Glucose, Bld: 197 mg/dL — ABNORMAL HIGH (ref 70–99)
Potassium: 3.8 mmol/L (ref 3.5–5.1)
Sodium: 136 mmol/L (ref 135–145)

## 2023-04-10 LAB — GLUCOSE, CAPILLARY
Glucose-Capillary: 184 mg/dL — ABNORMAL HIGH (ref 70–99)
Glucose-Capillary: 350 mg/dL — ABNORMAL HIGH (ref 70–99)
Glucose-Capillary: 281 mg/dL — ABNORMAL HIGH (ref 70–99)
Glucose-Capillary: 217 mg/dL — ABNORMAL HIGH (ref 70–99)

## 2023-04-10 NOTE — Progress Notes (Signed)
Physical Therapy Treatment Patient Details Name: Susan [name] MRN: [id number] DOB: 09-Jan-1927 Today's Date: 04/10/2023   History of Present Illness 89 y.o. 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    PT Comments  Pt resting in bed upon arrival, agreed to transfer to bedside chair via squat pivot (baseline LOF). Pt required Mod/MinA for bed mobility and transfers due to decreased strength and chronic ataxia. Positioned to comfort in recliner, B heels floated due to Right heel sore found, nursing notified and addressed. Will continue to progress per POC. Pt awaiting transition to SNF once medically cleared.    If plan is discharge home, recommend the following: A little help with walking and/or transfers;A little help with bathing/dressing/bathroom;Assistance with cooking/housework;Direct supervision/assist for financial management;Assist for transportation;Help with stairs or ramp for entrance   Can travel by private vehicle     No  Equipment Recommendations  Other (comment) (TBD at next level of care)    Recommendations for Other Services       Precautions / Restrictions Precautions Precautions: Fall Restrictions Weight Bearing Restrictions Per Provider Order: No     Mobility  Bed Mobility Overal bed mobility: Needs Assistance Bed Mobility: Sit to Supine Rolling: Min assist, Used rails   Supine to sit: Contact guard, HOB elevated, Used rails     General bed mobility comments: Increased time, relies on bed rails, baseline ataxia    Transfers Overall transfer level: Needs assistance Equipment used: None Transfers: Sit to/from Stand, Bed to chair/wheelchair/BSC Sit to Stand: Mod assist, Min assist     Squat pivot transfers: Mod assist, Min assist     General transfer comment: Baseline squat pivot  transfers with Mod/MinA    Ambulation/Gait               General Gait Details: Pt does not ambulate at baseline.   Stairs             Wheelchair Mobility     Tilt Bed    Modified Rankin (Stroke Patients Only)       Balance Overall balance assessment: Needs assistance Sitting-balance support: Feet supported, Bilateral upper extremity supported Sitting balance-Leahy Scale: Fair Sitting balance - Comments: Ataxic at baseline   Standing balance support: Bilateral upper extremity supported, During functional activity Standing balance-Leahy Scale: Poor Standing balance comment: primarily squat pivots at baseline.                            Cognition Arousal: Alert Behavior During Therapy: WFL for tasks assessed/performed Overall Cognitive Status: Within Functional Limits for tasks assessed                                 General Comments: baseline aphasia        Exercises      General Comments General comments (skin integrity, edema, etc.): R heal red and boogy, sensative to light touch, nursing notified      Pertinent Vitals/Pain Pain Assessment Pain Assessment: No/denies pain    Home Living                          Prior Function            PT Goals (current goals can  now be found in the care plan section) Acute Rehab PT Goals Patient Stated Goal: improve mobility    Frequency    Min 1X/week      PT Plan      Co-evaluation              AM-PAC PT "6 Clicks" Mobility   Outcome Measure  Help needed turning from your back to your side while in a flat bed without using bedrails?: A Little Help needed moving from lying on your back to sitting on the side of a flat bed without using bedrails?: A Little Help needed moving to and from a bed to a chair (including a wheelchair)?: A Little Help needed standing up from a chair using your arms (e.g., wheelchair or bedside chair)?: A Lot Help needed to  walk in hospital room?: Total Help needed climbing 3-5 steps with a railing? : Total 6 Click Score: 13    End of Session Equipment Utilized During Treatment: Gait belt Activity Tolerance: Patient limited by fatigue Patient left: in chair;with call bell/phone within reach;with chair alarm set Nurse Communication: Mobility status;Other (comment) (Right heel pressure sore) PT Visit Diagnosis: Other abnormalities of gait and mobility (R26.89);History of falling (Z91.81);Muscle weakness (generalized) (M62.81)     Time: 4098-1191 PT Time Calculation (min) (ACUTE ONLY): 18 min  Charges:    $Therapeutic Activity: 8-22 mins PT General Charges $$ ACUTE PT VISIT: 1 Visit                    Zadie Cleverly, PTA  Jannet Askew 04/10/2023, 1:11 PM

## 2023-04-10 NOTE — TOC Progression Note (Signed)
Transition of Care [facility]) - Progression Note    Patient Details  Name: Susan [name] MRN: [id number] Date of Birth: [birth year]  [facility]) CM/SW Contact  Marlowe Sax, RN Phone Number: 04/10/2023, 2:37 PM  Clinical Narrative:    Met with the patient to discuss DC plan and needs Reviewed the bed offers and she chose [facility] due to it is nearby Ins auth started ref number [id number]        Expected Discharge Plan and Services                                               Social Determinants of Health (SDOH) Interventions SDOH Screenings   Food Insecurity: No Food Insecurity (04/05/2023)  Housing: Low Risk  (04/05/2023)  Transportation Needs: No Transportation Needs (04/05/2023)  Utilities: Not At Risk (04/05/2023)  Tobacco Use: Medium Risk (04/04/2023)    Readmission Risk Interventions     No data to display

## 2023-04-10 NOTE — Plan of Care (Signed)
  Problem: Education: Goal: Ability to describe self-care measures that may prevent or decrease complications (Diabetes Survival Skills Education) will improve Outcome: Progressing   Problem: Coping: Goal: Ability to adjust to condition or change in health will improve Outcome: Progressing   Problem: Fluid Volume: Goal: Ability to maintain a balanced intake and output will improve Outcome: Progressing   Problem: Metabolic: Goal: Ability to maintain appropriate glucose levels will improve Outcome: Progressing   Problem: Nutritional: Goal: Maintenance of adequate nutrition will improve Outcome: Progressing   Problem: Skin Integrity: Goal: Risk for impaired skin integrity will decrease Outcome: Progressing   Problem: Tissue Perfusion: Goal: Adequacy of tissue perfusion will improve Outcome: Progressing   Problem: Education: Goal: Knowledge of General Education information will improve Description: Including pain rating scale, medication(s)/side effects and non-pharmacologic comfort measures Outcome: Progressing   Problem: Activity: Goal: Risk for activity intolerance will decrease Outcome: Progressing   Problem: Nutrition: Goal: Adequate nutrition will be maintained Outcome: Progressing   Problem: Coping: Goal: Level of anxiety will decrease Outcome: Progressing   Problem: Pain Management: Goal: General experience of comfort will improve Outcome: Progressing   Problem: Safety: Goal: Ability to remain free from injury will improve Outcome: Progressing   Problem: Skin Integrity: Goal: Risk for impaired skin integrity will decrease Outcome: Progressing

## 2023-04-10 NOTE — Progress Notes (Signed)
PROGRESS NOTE    Susan [name]  VWU:[id number] DOB: 1926/12/15 DOA: 04/04/2023 PCP: [name], MD   Assessment & Plan:   Principal Problem:   AKI (acute kidney injury) (HCC) Active Problems:   Acute cystitis with hematuria   Generalized weakness   Hypernatremia   Uncontrolled type 2 diabetes mellitus with hyperglycemia, without long-term current use of insulin (HCC)   History of CVA (cerebrovascular accident)   Essential hypertension   Underweight (BMI < 18.5)  Assessment and Plan: AKI: resolved    Acute cystitis: with hematuria. Completed rocephin. Resolved    Generalized weakness: PT recs SNF. Waiting on insurance auth    Hypernatremia: resolved   DM2: poorly controlled. Continue on aspart, tradjenta    Hx of CVA: continue on aspirin, statin    HTN: continue on amlodipine, lisinopril    Underweight: BMI 18.0. Encourage po intake      DVT prophylaxis: heparin  Code Status: DNR Family Communication: Disposition Plan: d/c to SNF.   Level of care: Telemetry Medical  Status is: Inpatient Remains inpatient appropriate because: medically stable. Waiting on insurance auth     Consultants:    Procedures:  Antimicrobials:    Subjective: Pt c/o fatigue   Objective: Vitals:   04/09/23 1527 04/09/23 2341 04/10/23 0823 04/10/23 0823  BP: (!) 137/51 (!) 110/49 (!) 151/57 (!) 151/57  Pulse: 83 67 72 72  Resp: 18 20 17 17   Temp: 98.7 F (37.1 C) 98.3 F (36.8 C) 98.6 F (37 C) 98.6 F (37 C)  TempSrc: Oral     SpO2: 99% 98% 96% 97%  Weight:      Height:       No intake or output data in the 24 hours ending 04/10/23 1036 Filed Weights   04/04/23 0053  Weight: 50.8 kg    Examination:  General exam: Appears calm and comfortable  Respiratory system: Clear to auscultation. Respiratory effort normal. Cardiovascular system: S1 & S2+. No  rubs, gallops or clicks. Gastrointestinal system: Abdomen is nondistended, soft and nontender. Normal bowel  sounds heard. Central nervous system: Alert and oriented. Moves all extremities  Psychiatry: Judgement and insight appears at baseline. Flat mood and affect    Data Reviewed: I have personally reviewed following labs and imaging studies  CBC: Recent Labs  Lab 04/04/23 0058 04/07/23 0819 04/10/23 0531  WBC 14.7* 8.2 7.8  HGB 12.5 12.6 11.2*  HCT 40.1 39.7 35.5*  MCV 86.8 83.8 85.1  PLT 318 270 235   Basic Metabolic Panel: Recent Labs  Lab 04/04/23 0058 04/05/23 0434 04/07/23 0819 04/10/23 0531  NA 144 147* 141 136  K 3.8 3.3* 3.7 3.8  CL 108 116* 107 105  CO2 27 22 26  21*  GLUCOSE 449* 158* 162* 197*  BUN 56* 30* 13 21  CREATININE 1.26* 0.80 0.75 0.81  CALCIUM 9.4 8.3* 8.4* 8.0*   GFR: Estimated Creatinine Clearance: 34.1 mL/min (by C-G formula based on SCr of 0.81 mg/dL). Liver Function Tests: No results for input(s): "AST", "ALT", "ALKPHOS", "BILITOT", "PROT", "ALBUMIN" in the last 168 hours. No results for input(s): "LIPASE", "AMYLASE" in the last 168 hours. No results for input(s): "AMMONIA" in the last 168 hours. Coagulation Profile: No results for input(s): "INR", "PROTIME" in the last 168 hours. Cardiac Enzymes: Recent Labs  Lab 04/04/23 0058  CKTOTAL 51   BNP (last 3 results) No results for input(s): "PROBNP" in the last 8760 hours. HbA1C: No results for input(s): "HGBA1C" in the last 72 hours. CBG:  Recent Labs  Lab 04/09/23 0832 04/09/23 1210 04/09/23 1651 04/09/23 2126 04/10/23 0821  GLUCAP 149* 333* 350* 247* 184*   Lipid Profile: No results for input(s): "CHOL", "HDL", "LDLCALC", "TRIG", "CHOLHDL", "LDLDIRECT" in the last 72 hours. Thyroid Function Tests: No results for input(s): "TSH", "T4TOTAL", "FREET4", "T3FREE", "THYROIDAB" in the last 72 hours. Anemia Panel: No results for input(s): "VITAMINB12", "FOLATE", "FERRITIN", "TIBC", "IRON", "RETICCTPCT" in the last 72 hours. Sepsis Labs: No results for input(s): "PROCALCITON",  "LATICACIDVEN" in the last 168 hours.  Recent Results (from the past 240 hours)  Resp panel by RT-PCR (RSV, Flu A&B, Covid) Anterior Nasal Swab     Status: None   Collection Time: 04/04/23  5:51 AM   Specimen: Anterior Nasal Swab  Result Value Ref Range Status   SARS Coronavirus 2 by RT PCR NEGATIVE NEGATIVE Final    Comment: (NOTE) SARS-CoV-2 target nucleic acids are NOT DETECTED.  The SARS-CoV-2 RNA is generally detectable in upper respiratory specimens during the acute phase of infection. The lowest concentration of SARS-CoV-2 viral copies this assay can detect is 138 copies/mL. A negative result does not preclude SARS-Cov-2 infection and should not be used as the sole basis for treatment or other patient management decisions. A negative result may occur with  improper specimen collection/handling, submission of specimen other than nasopharyngeal swab, presence of viral mutation(s) within the areas targeted by this assay, and inadequate number of viral copies(<138 copies/mL). A negative result must be combined with clinical observations, patient history, and epidemiological information. The expected result is Negative.  Fact Sheet for Patients:  BloggerCourse.com  Fact Sheet for Healthcare Providers:  SeriousBroker.it  This test is no t yet approved or cleared by the [place] FDA and  has been authorized for detection and/or diagnosis of SARS-CoV-2 by FDA under an Emergency Use Authorization (EUA). This EUA will remain  in effect (meaning this test can be used) for the duration of the COVID-19 declaration under Section 564(b)(1) of the Act, 21 U.S.C.section 360bbb-3(b)(1), unless the authorization is terminated  or revoked sooner.       Influenza A by PCR NEGATIVE NEGATIVE Final   Influenza B by PCR NEGATIVE NEGATIVE Final    Comment: (NOTE) The Xpert Xpress SARS-CoV-2/FLU/RSV plus assay is intended as an aid in the  diagnosis of influenza from Nasopharyngeal swab specimens and should not be used as a sole basis for treatment. Nasal washings and aspirates are unacceptable for Xpert Xpress SARS-CoV-2/FLU/RSV testing.  Fact Sheet for Patients: BloggerCourse.com  Fact Sheet for Healthcare Providers: SeriousBroker.it  This test is not yet approved or cleared by the [place] FDA and has been authorized for detection and/or diagnosis of SARS-CoV-2 by FDA under an Emergency Use Authorization (EUA). This EUA will remain in effect (meaning this test can be used) for the duration of the COVID-19 declaration under Section 564(b)(1) of the Act, 21 U.S.C. section 360bbb-3(b)(1), unless the authorization is terminated or revoked.     Resp Syncytial Virus by PCR NEGATIVE NEGATIVE Final    Comment: (NOTE) Fact Sheet for Patients: BloggerCourse.com  Fact Sheet for Healthcare Providers: SeriousBroker.it  This test is not yet approved or cleared by the [place] FDA and has been authorized for detection and/or diagnosis of SARS-CoV-2 by FDA under an Emergency Use Authorization (EUA). This EUA will remain in effect (meaning this test can be used) for the duration of the COVID-19 declaration under Section 564(b)(1) of the Act, 21 U.S.C. section 360bbb-3(b)(1), unless the authorization is terminated or  revoked.  Performed at [facility], [address] [place], [place] [address]          Radiology Studies: No results found.      Scheduled Meds:  amLODipine  10 mg Oral QAC supper   aspirin EC  81 mg Oral Daily   atorvastatin  20 mg Oral QPC supper   docusate sodium  100 mg Oral BID   heparin  5,000 Units Subcutaneous Q12H   insulin aspart  0-9 Units Subcutaneous TID WC   insulin aspart  3 Units Subcutaneous TID WC   linagliptin  5 mg Oral Daily   lisinopril  20 mg Oral Daily    Continuous Infusions:   LOS: 5 days      Charise Killian, MD [facility] Pager [phone]  If 7PM-7AM, please contact night-coverage [email or web address] 04/10/2023, 10:36 AM

## 2023-04-11 DIAGNOSIS — N179 Acute kidney failure, unspecified: Secondary | ICD-10-CM | POA: Diagnosis not present

## 2023-04-11 LAB — GLUCOSE, CAPILLARY
Glucose-Capillary: 179 mg/dL — ABNORMAL HIGH (ref 70–99)
Glucose-Capillary: 370 mg/dL — ABNORMAL HIGH (ref 70–99)
Glucose-Capillary: 253 mg/dL — ABNORMAL HIGH (ref 70–99)
Glucose-Capillary: 109 mg/dL — ABNORMAL HIGH (ref 70–99)

## 2023-04-11 NOTE — Progress Notes (Signed)
PROGRESS NOTE    WETONA HOLLINGSWORTH  WJX:[id number] DOB: 1927-01-21 DOA: 04/04/2023 PCP: [name], MD   Assessment & Plan:   Principal Problem:   AKI (acute kidney injury) (HCC) Active Problems:   Acute cystitis with hematuria   Generalized weakness   Hypernatremia   Uncontrolled type 2 diabetes mellitus with hyperglycemia, without long-term current use of insulin (HCC)   History of CVA (cerebrovascular accident)   Essential hypertension   Underweight (BMI < 18.5)  Assessment and Plan: AKI: resolved    Acute cystitis: with hematuria. Completed rocephin. Resolved    Generalized weakness: PT recs SNF. Waiting on insurance auth     Hypernatremia: resolved   DM2: poorly controlled. Continue on tradjenta, aspart      Hx of CVA: continue on statin, aspirin    HTN: continue on lisinopril, amlodipine    Underweight: BMI 18.0. Encourage po intake      DVT prophylaxis: heparin  Code Status: DNR Family Communication: discussed pt's care w/ pt's son, Madie Reno, and answered his questions  Disposition Plan: d/c to SNF.   Level of care: Telemetry Medical  Status is: Inpatient Remains inpatient appropriate because: medically stable. Waiting on insurance auth     Consultants:    Procedures:  Antimicrobials:    Subjective: Pt denies any pain   Objective: Vitals:   04/10/23 0823 04/10/23 1652 04/10/23 2224 04/11/23 0739  BP: (!) 151/57 (!) 147/55 (!) 129/48 (!) 127/58  Pulse: 72 78 71 70  Resp: 17 17 15 12   Temp: 98.6 F (37 C) 98.2 F (36.8 C) 98.3 F (36.8 C) 98.3 F (36.8 C)  TempSrc:      SpO2: 97% 98% 98% 100%  Weight:      Height:       No intake or output data in the 24 hours ending 04/11/23 0832 Filed Weights   04/04/23 0053  Weight: 50.8 kg    Examination:  General exam: Appears comfortable  Respiratory system: clear breath sounds b/l Cardiovascular system: S1/S2+. No rubs or clicks  Gastrointestinal system: Abd is soft, NT, ND & normal bowel  sounds  Central nervous system: alert & oriented. Moves all extremities  Psychiatry: Judgement and insight appears at baseline. Appropriate mood and affect   Data Reviewed: I have personally reviewed following labs and imaging studies  CBC: Recent Labs  Lab 04/07/23 0819 04/10/23 0531  WBC 8.2 7.8  HGB 12.6 11.2*  HCT 39.7 35.5*  MCV 83.8 85.1  PLT 270 235   Basic Metabolic Panel: Recent Labs  Lab 04/05/23 0434 04/07/23 0819 04/10/23 0531  NA 147* 141 136  K 3.3* 3.7 3.8  CL 116* 107 105  CO2 22 26 21*  GLUCOSE 158* 162* 197*  BUN 30* 13 21  CREATININE 0.80 0.75 0.81  CALCIUM 8.3* 8.4* 8.0*   GFR: Estimated Creatinine Clearance: 34.1 mL/min (by C-G formula based on SCr of 0.81 mg/dL). Liver Function Tests: No results for input(s): "AST", "ALT", "ALKPHOS", "BILITOT", "PROT", "ALBUMIN" in the last 168 hours. No results for input(s): "LIPASE", "AMYLASE" in the last 168 hours. No results for input(s): "AMMONIA" in the last 168 hours. Coagulation Profile: No results for input(s): "INR", "PROTIME" in the last 168 hours. Cardiac Enzymes: No results for input(s): "CKTOTAL", "CKMB", "CKMBINDEX", "TROPONINI" in the last 168 hours.  BNP (last 3 results) No results for input(s): "PROBNP" in the last 8760 hours. HbA1C: No results for input(s): "HGBA1C" in the last 72 hours. CBG: Recent Labs  Lab 04/10/23 [phone]  04/10/23 1206 04/10/23 1739 04/10/23 2115 04/11/23 0741  GLUCAP 184* 350* 281* 217* 179*   Lipid Profile: No results for input(s): "CHOL", "HDL", "LDLCALC", "TRIG", "CHOLHDL", "LDLDIRECT" in the last 72 hours. Thyroid Function Tests: No results for input(s): "TSH", "T4TOTAL", "FREET4", "T3FREE", "THYROIDAB" in the last 72 hours. Anemia Panel: No results for input(s): "VITAMINB12", "FOLATE", "FERRITIN", "TIBC", "IRON", "RETICCTPCT" in the last 72 hours. Sepsis Labs: No results for input(s): "PROCALCITON", "LATICACIDVEN" in the last 168 hours.  Recent Results  (from the past 240 hours)  Resp panel by RT-PCR (RSV, Flu A&B, Covid) Anterior Nasal Swab     Status: None   Collection Time: 04/04/23  5:51 AM   Specimen: Anterior Nasal Swab  Result Value Ref Range Status   SARS Coronavirus 2 by RT PCR NEGATIVE NEGATIVE Final    Comment: (NOTE) SARS-CoV-2 target nucleic acids are NOT DETECTED.  The SARS-CoV-2 RNA is generally detectable in upper respiratory specimens during the acute phase of infection. The lowest concentration of SARS-CoV-2 viral copies this assay can detect is 138 copies/mL. A negative result does not preclude SARS-Cov-2 infection and should not be used as the sole basis for treatment or other patient management decisions. A negative result may occur with  improper specimen collection/handling, submission of specimen other than nasopharyngeal swab, presence of viral mutation(s) within the areas targeted by this assay, and inadequate number of viral copies(<138 copies/mL). A negative result must be combined with clinical observations, patient history, and epidemiological information. The expected result is Negative.  Fact Sheet for Patients:  BloggerCourse.com  Fact Sheet for Healthcare Providers:  SeriousBroker.it  This test is no t yet approved or cleared by the [place] FDA and  has been authorized for detection and/or diagnosis of SARS-CoV-2 by FDA under an Emergency Use Authorization (EUA). This EUA will remain  in effect (meaning this test can be used) for the duration of the COVID-19 declaration under Section 564(b)(1) of the Act, 21 U.S.C.section 360bbb-3(b)(1), unless the authorization is terminated  or revoked sooner.       Influenza A by PCR NEGATIVE NEGATIVE Final   Influenza B by PCR NEGATIVE NEGATIVE Final    Comment: (NOTE) The Xpert Xpress SARS-CoV-2/FLU/RSV plus assay is intended as an aid in the diagnosis of influenza from Nasopharyngeal swab  specimens and should not be used as a sole basis for treatment. Nasal washings and aspirates are unacceptable for Xpert Xpress SARS-CoV-2/FLU/RSV testing.  Fact Sheet for Patients: BloggerCourse.com  Fact Sheet for Healthcare Providers: SeriousBroker.it  This test is not yet approved or cleared by the [place] FDA and has been authorized for detection and/or diagnosis of SARS-CoV-2 by FDA under an Emergency Use Authorization (EUA). This EUA will remain in effect (meaning this test can be used) for the duration of the COVID-19 declaration under Section 564(b)(1) of the Act, 21 U.S.C. section 360bbb-3(b)(1), unless the authorization is terminated or revoked.     Resp Syncytial Virus by PCR NEGATIVE NEGATIVE Final    Comment: (NOTE) Fact Sheet for Patients: BloggerCourse.com  Fact Sheet for Healthcare Providers: SeriousBroker.it  This test is not yet approved or cleared by the [place] FDA and has been authorized for detection and/or diagnosis of SARS-CoV-2 by FDA under an Emergency Use Authorization (EUA). This EUA will remain in effect (meaning this test can be used) for the duration of the COVID-19 declaration under Section 564(b)(1) of the Act, 21 U.S.C. section 360bbb-3(b)(1), unless the authorization is terminated or revoked.  Performed at [facility]  Lab, [address], [place], [place] [address]          Radiology Studies: No results found.      Scheduled Meds:  amLODipine  10 mg Oral QAC supper   aspirin EC  81 mg Oral Daily   atorvastatin  20 mg Oral QPC supper   docusate sodium  100 mg Oral BID   heparin  5,000 Units Subcutaneous Q12H   insulin aspart  0-9 Units Subcutaneous TID WC   insulin aspart  3 Units Subcutaneous TID WC   linagliptin  5 mg Oral Daily   lisinopril  20 mg Oral Daily   Continuous Infusions:   LOS: 6 days       Charise Killian, MD [facility] Pager [phone]  If 7PM-7AM, please contact night-coverage [email or web address] 04/11/2023, 8:32 AM

## 2023-04-12 DIAGNOSIS — N179 Acute kidney failure, unspecified: Secondary | ICD-10-CM | POA: Diagnosis not present

## 2023-04-12 LAB — GLUCOSE, CAPILLARY
Glucose-Capillary: 158 mg/dL — ABNORMAL HIGH (ref 70–99)
Glucose-Capillary: 233 mg/dL — ABNORMAL HIGH (ref 70–99)
Glucose-Capillary: 128 mg/dL — ABNORMAL HIGH (ref 70–99)
Glucose-Capillary: 108 mg/dL — ABNORMAL HIGH (ref 70–99)

## 2023-04-12 NOTE — TOC Progression Note (Signed)
Transition of Care [facility]) - Progression Note    Patient Details  Name: Susan [name] MRN: [id number] Date of Birth: 1927-01-27  Transition of Care [facility]) CM/SW Contact  Bing Quarry, RN Phone Number: 04/12/2023, 3:08 PM  Clinical Narrative: 12/29: Delice Bison and Auth approved #[id number]. Sent to [facility] via [facility].     Gabriel Cirri MSN RN CM  Care Management Department.  [place]  [facility] Direct Dial: [phone] (Weekends Only) [facility] Main Office Phone: [phone] [facility] Fax: [phone]          Expected Discharge Plan and Services                                               Social Determinants of Health (SDOH) Interventions SDOH Screenings   Food Insecurity: No Food Insecurity (04/05/2023)  Housing: Low Risk  (04/05/2023)  Transportation Needs: No Transportation Needs (04/05/2023)  Utilities: Not At Risk (04/05/2023)  Tobacco Use: Medium Risk (04/04/2023)    Readmission Risk Interventions     No data to display

## 2023-04-12 NOTE — Progress Notes (Signed)
°   04/12/23 1420  Vitals  Temp 100 F (37.8 C)  Temp Source Oral   Decreased appetite, malaise. Notified MD. Continue with current plan of care.  Lidia Collum, RN

## 2023-04-12 NOTE — Progress Notes (Signed)
PROGRESS NOTE    Susan [name]  VHQ:[id number] DOB: 1927-03-16 DOA: 04/04/2023 PCP: [name], MD   Assessment & Plan:   Principal Problem:   AKI (acute kidney injury) (HCC) Active Problems:   Acute cystitis with hematuria   Generalized weakness   Hypernatremia   Uncontrolled type 2 diabetes mellitus with hyperglycemia, without long-term current use of insulin (HCC)   History of CVA (cerebrovascular accident)   Essential hypertension   Underweight (BMI < 18.5)  Assessment and Plan: AKI: resolved    Acute cystitis: with hematuria. Completed rocephin. Resolved    Generalized weakness: PT recs SNF. Waiting on insurance auth   Hypernatremia: resolved   DM2: poorly controlled. Continue on aspart, tradjenta    Hx of CVA: continue on aspirin, statin     HTN: continue on amlodipine, lisinopril    Underweight: BMI 18.0. Encourage po intake      DVT prophylaxis: heparin  Code Status: DNR Family Communication:  Disposition Plan: d/c to SNF.   Level of care: Telemetry Medical  Status is: Inpatient Remains inpatient appropriate because: medically stable. Waiting on insurance auth     Consultants:    Procedures:  Antimicrobials:    Subjective: Pt c/o fatigue   Objective: Vitals:   04/11/23 0739 04/11/23 1538 04/11/23 2129 04/12/23 0746  BP: (!) 127/58 (!) 120/44 (!) 132/55 (!) 115/40  Pulse: 70 78 79 72  Resp: 12 18 17 16   Temp: 98.3 F (36.8 C) 99 F (37.2 C) 99.3 F (37.4 C) 98.2 F (36.8 C)  TempSrc:   Oral   SpO2: 100% 100% 96% 97%  Weight:      Height:       No intake or output data in the 24 hours ending 04/12/23 0814 Filed Weights   04/04/23 0053  Weight: 50.8 kg    Examination:  General exam: appears calm & comfortable  Respiratory system: clear breath sounds b/l Cardiovascular system: S1 & S2+ Gastrointestinal system: abd is soft, NT, ND & normal bowel sounds Central nervous system: alert & oriented. Moves all extremities    Psychiatry: judgement and insight appears at baseline. Appropriate mood and affect   Data Reviewed: I have personally reviewed following labs and imaging studies  CBC: Recent Labs  Lab 04/07/23 0819 04/10/23 0531  WBC 8.2 7.8  HGB 12.6 11.2*  HCT 39.7 35.5*  MCV 83.8 85.1  PLT 270 235   Basic Metabolic Panel: Recent Labs  Lab 04/07/23 0819 04/10/23 0531  NA 141 136  K 3.7 3.8  CL 107 105  CO2 26 21*  GLUCOSE 162* 197*  BUN 13 21  CREATININE 0.75 0.81  CALCIUM 8.4* 8.0*   GFR: Estimated Creatinine Clearance: 34.1 mL/min (by C-G formula based on SCr of 0.81 mg/dL). Liver Function Tests: No results for input(s): "AST", "ALT", "ALKPHOS", "BILITOT", "PROT", "ALBUMIN" in the last 168 hours. No results for input(s): "LIPASE", "AMYLASE" in the last 168 hours. No results for input(s): "AMMONIA" in the last 168 hours. Coagulation Profile: No results for input(s): "INR", "PROTIME" in the last 168 hours. Cardiac Enzymes: No results for input(s): "CKTOTAL", "CKMB", "CKMBINDEX", "TROPONINI" in the last 168 hours.  BNP (last 3 results) No results for input(s): "PROBNP" in the last 8760 hours. HbA1C: No results for input(s): "HGBA1C" in the last 72 hours. CBG: Recent Labs  Lab 04/11/23 0741 04/11/23 1200 04/11/23 1630 04/11/23 2204 04/12/23 0743  GLUCAP 179* 370* 253* 109* 158*   Lipid Profile: No results for input(s): "CHOL", "HDL", "LDLCALC", "  TRIG", "CHOLHDL", "LDLDIRECT" in the last 72 hours. Thyroid Function Tests: No results for input(s): "TSH", "T4TOTAL", "FREET4", "T3FREE", "THYROIDAB" in the last 72 hours. Anemia Panel: No results for input(s): "VITAMINB12", "FOLATE", "FERRITIN", "TIBC", "IRON", "RETICCTPCT" in the last 72 hours. Sepsis Labs: No results for input(s): "PROCALCITON", "LATICACIDVEN" in the last 168 hours.  Recent Results (from the past 240 hours)  Resp panel by RT-PCR (RSV, Flu A&B, Covid) Anterior Nasal Swab     Status: None   Collection  Time: 04/04/23  5:51 AM   Specimen: Anterior Nasal Swab  Result Value Ref Range Status   SARS Coronavirus 2 by RT PCR NEGATIVE NEGATIVE Final    Comment: (NOTE) SARS-CoV-2 target nucleic acids are NOT DETECTED.  The SARS-CoV-2 RNA is generally detectable in upper respiratory specimens during the acute phase of infection. The lowest concentration of SARS-CoV-2 viral copies this assay can detect is 138 copies/mL. A negative result does not preclude SARS-Cov-2 infection and should not be used as the sole basis for treatment or other patient management decisions. A negative result may occur with  improper specimen collection/handling, submission of specimen other than nasopharyngeal swab, presence of viral mutation(s) within the areas targeted by this assay, and inadequate number of viral copies(<138 copies/mL). A negative result must be combined with clinical observations, patient history, and epidemiological information. The expected result is Negative.  Fact Sheet for Patients:  BloggerCourse.com  Fact Sheet for Healthcare Providers:  SeriousBroker.it  This test is no t yet approved or cleared by the [place] FDA and  has been authorized for detection and/or diagnosis of SARS-CoV-2 by FDA under an Emergency Use Authorization (EUA). This EUA will remain  in effect (meaning this test can be used) for the duration of the COVID-19 declaration under Section 564(b)(1) of the Act, 21 U.S.C.section 360bbb-3(b)(1), unless the authorization is terminated  or revoked sooner.       Influenza A by PCR NEGATIVE NEGATIVE Final   Influenza B by PCR NEGATIVE NEGATIVE Final    Comment: (NOTE) The Xpert Xpress SARS-CoV-2/FLU/RSV plus assay is intended as an aid in the diagnosis of influenza from Nasopharyngeal swab specimens and should not be used as a sole basis for treatment. Nasal washings and aspirates are unacceptable for Xpert Xpress  SARS-CoV-2/FLU/RSV testing.  Fact Sheet for Patients: BloggerCourse.com  Fact Sheet for Healthcare Providers: SeriousBroker.it  This test is not yet approved or cleared by the [place] FDA and has been authorized for detection and/or diagnosis of SARS-CoV-2 by FDA under an Emergency Use Authorization (EUA). This EUA will remain in effect (meaning this test can be used) for the duration of the COVID-19 declaration under Section 564(b)(1) of the Act, 21 U.S.C. section 360bbb-3(b)(1), unless the authorization is terminated or revoked.     Resp Syncytial Virus by PCR NEGATIVE NEGATIVE Final    Comment: (NOTE) Fact Sheet for Patients: BloggerCourse.com  Fact Sheet for Healthcare Providers: SeriousBroker.it  This test is not yet approved or cleared by the [place] FDA and has been authorized for detection and/or diagnosis of SARS-CoV-2 by FDA under an Emergency Use Authorization (EUA). This EUA will remain in effect (meaning this test can be used) for the duration of the COVID-19 declaration under Section 564(b)(1) of the Act, 21 U.S.C. section 360bbb-3(b)(1), unless the authorization is terminated or revoked.  Performed at [facility], [address], [place], [place] [address]          Radiology Studies: No results found.  Scheduled Meds:  amLODipine  10 mg Oral QAC supper   aspirin EC  81 mg Oral Daily   atorvastatin  20 mg Oral QPC supper   docusate sodium  100 mg Oral BID   heparin  5,000 Units Subcutaneous Q12H   insulin aspart  0-9 Units Subcutaneous TID WC   insulin aspart  3 Units Subcutaneous TID WC   linagliptin  5 mg Oral Daily   lisinopril  20 mg Oral Daily   Continuous Infusions:   LOS: 7 days      Charise Killian, MD [facility] Pager [phone]  If 7PM-7AM, please contact  night-coverage [email or web address] 04/12/2023, 8:14 AM

## 2023-04-12 NOTE — Plan of Care (Signed)

## 2023-04-13 ENCOUNTER — Ambulatory Visit: Payer: Medicare Other | Admitting: Occupational Therapy

## 2023-04-13 DIAGNOSIS — N179 Acute kidney failure, unspecified: Secondary | ICD-10-CM | POA: Diagnosis not present

## 2023-04-13 LAB — GLUCOSE, CAPILLARY
Glucose-Capillary: 164 mg/dL — ABNORMAL HIGH (ref 70–99)
Glucose-Capillary: 254 mg/dL — ABNORMAL HIGH (ref 70–99)
Glucose-Capillary: 206 mg/dL — ABNORMAL HIGH (ref 70–99)
Glucose-Capillary: 135 mg/dL — ABNORMAL HIGH (ref 70–99)

## 2023-04-13 NOTE — TOC Progression Note (Signed)
Transition of Care [facility]) - Progression Note    Patient Details  Name: Susan [name] MRN: [id number] Date of Birth: [birth year]  Transition of Care [facility]) CM/SW Contact  Marlowe Sax, RN Phone Number: 04/13/2023, 9:42 AM  Clinical Narrative:     Checked status of Ins auth, to go to [facility], Ins approved [id number] 12/30-1/1       Expected Discharge Plan and Services                                               Social Determinants of Health (SDOH) Interventions SDOH Screenings   Food Insecurity: No Food Insecurity (04/05/2023)  Housing: Low Risk  (04/05/2023)  Transportation Needs: No Transportation Needs (04/05/2023)  Utilities: Not At Risk (04/05/2023)  Tobacco Use: Medium Risk (04/04/2023)    Readmission Risk Interventions     No data to display

## 2023-04-13 NOTE — Progress Notes (Signed)
PROGRESS NOTE    Susan [name]  VHQ:[id number] DOB: 10/01/1926 DOA: 04/04/2023 PCP: [name], MD   Assessment & Plan:   Principal Problem:   AKI (acute kidney injury) (HCC) Active Problems:   Acute cystitis with hematuria   Generalized weakness   Hypernatremia   Uncontrolled type 2 diabetes mellitus with hyperglycemia, without long-term current use of insulin (HCC)   History of CVA (cerebrovascular accident)   Essential hypertension   Underweight (BMI < 18.5)  Assessment and Plan: AKI: resolved    Acute cystitis: with hematuria. Completed rocephin. Resolved    Generalized weakness: PT recs SNF   Hypernatremia: resolved   DM2: poorly controlled. Continue on tradjenta, aspart    Hx of CVA: continue on statin, aspirin    HTN: continue on lisinopril, amlodipine    Underweight: BMI 18.0. Encourage po intake      DVT prophylaxis: heparin  Code Status: DNR Family Communication: discussed pt's care w/ pt's son, Madie Reno, and answered his questions Disposition Plan: d/c to SNF.   Level of care: Telemetry Medical  Status is: Inpatient Remains inpatient appropriate because: had a fever w/in last 24 hrs and can likely be d/c to SNF tomorrow if fever free     Consultants:    Procedures:  Antimicrobials:    Subjective: Pt c/o fatigue   Objective: Vitals:   04/12/23 1420 04/12/23 1539 04/12/23 1804 04/12/23 2139  BP:  (!) 115/57  (!) 120/57  Pulse:  75  69  Resp:  15  20  Temp: 100 F (37.8 C) 100.2 F (37.9 C) (!) 100.4 F (38 C) 98.2 F (36.8 C)  TempSrc: Oral  Oral   SpO2:  90%  96%  Weight:      Height:       No intake or output data in the 24 hours ending 04/13/23 0837 Filed Weights   04/04/23 0053  Weight: 50.8 kg    Examination:  General exam: appears comfortable  Respiratory system: clear breath sounds b/l  Cardiovascular system: S1/S2+. No rubs or clicks Gastrointestinal system: abd is soft, NT, ND or normal bowel sounds  Central  nervous system: alert & oriented. Moves all extremities   Psychiatry: judgement and insight appears at baseline. Appropriate mood and affect   Data Reviewed: I have personally reviewed following labs and imaging studies  CBC: Recent Labs  Lab 04/07/23 0819 04/10/23 0531  WBC 8.2 7.8  HGB 12.6 11.2*  HCT 39.7 35.5*  MCV 83.8 85.1  PLT 270 235   Basic Metabolic Panel: Recent Labs  Lab 04/07/23 0819 04/10/23 0531  NA 141 136  K 3.7 3.8  CL 107 105  CO2 26 21*  GLUCOSE 162* 197*  BUN 13 21  CREATININE 0.75 0.81  CALCIUM 8.4* 8.0*   GFR: Estimated Creatinine Clearance: 34.1 mL/min (by C-G formula based on SCr of 0.81 mg/dL). Liver Function Tests: No results for input(s): "AST", "ALT", "ALKPHOS", "BILITOT", "PROT", "ALBUMIN" in the last 168 hours. No results for input(s): "LIPASE", "AMYLASE" in the last 168 hours. No results for input(s): "AMMONIA" in the last 168 hours. Coagulation Profile: No results for input(s): "INR", "PROTIME" in the last 168 hours. Cardiac Enzymes: No results for input(s): "CKTOTAL", "CKMB", "CKMBINDEX", "TROPONINI" in the last 168 hours.  BNP (last 3 results) No results for input(s): "PROBNP" in the last 8760 hours. HbA1C: No results for input(s): "HGBA1C" in the last 72 hours. CBG: Recent Labs  Lab 04/12/23 0743 04/12/23 1159 04/12/23 1622 04/12/23 2143 04/13/23 4132  GLUCAP 158* 233* 128* 108* 164*   Lipid Profile: No results for input(s): "CHOL", "HDL", "LDLCALC", "TRIG", "CHOLHDL", "LDLDIRECT" in the last 72 hours. Thyroid Function Tests: No results for input(s): "TSH", "T4TOTAL", "FREET4", "T3FREE", "THYROIDAB" in the last 72 hours. Anemia Panel: No results for input(s): "VITAMINB12", "FOLATE", "FERRITIN", "TIBC", "IRON", "RETICCTPCT" in the last 72 hours. Sepsis Labs: No results for input(s): "PROCALCITON", "LATICACIDVEN" in the last 168 hours.  Recent Results (from the past 240 hours)  Resp panel by RT-PCR (RSV, Flu A&B,  Covid) Anterior Nasal Swab     Status: None   Collection Time: 04/04/23  5:51 AM   Specimen: Anterior Nasal Swab  Result Value Ref Range Status   SARS Coronavirus 2 by RT PCR NEGATIVE NEGATIVE Final    Comment: (NOTE) SARS-CoV-2 target nucleic acids are NOT DETECTED.  The SARS-CoV-2 RNA is generally detectable in upper respiratory specimens during the acute phase of infection. The lowest concentration of SARS-CoV-2 viral copies this assay can detect is 138 copies/mL. A negative result does not preclude SARS-Cov-2 infection and should not be used as the sole basis for treatment or other patient management decisions. A negative result may occur with  improper specimen collection/handling, submission of specimen other than nasopharyngeal swab, presence of viral mutation(s) within the areas targeted by this assay, and inadequate number of viral copies(<138 copies/mL). A negative result must be combined with clinical observations, patient history, and epidemiological information. The expected result is Negative.  Fact Sheet for Patients:  BloggerCourse.com  Fact Sheet for Healthcare Providers:  SeriousBroker.it  This test is no t yet approved or cleared by the [place] FDA and  has been authorized for detection and/or diagnosis of SARS-CoV-2 by FDA under an Emergency Use Authorization (EUA). This EUA will remain  in effect (meaning this test can be used) for the duration of the COVID-19 declaration under Section 564(b)(1) of the Act, 21 U.S.C.section 360bbb-3(b)(1), unless the authorization is terminated  or revoked sooner.       Influenza A by PCR NEGATIVE NEGATIVE Final   Influenza B by PCR NEGATIVE NEGATIVE Final    Comment: (NOTE) The Xpert Xpress SARS-CoV-2/FLU/RSV plus assay is intended as an aid in the diagnosis of influenza from Nasopharyngeal swab specimens and should not be used as a sole basis for treatment. Nasal  washings and aspirates are unacceptable for Xpert Xpress SARS-CoV-2/FLU/RSV testing.  Fact Sheet for Patients: BloggerCourse.com  Fact Sheet for Healthcare Providers: SeriousBroker.it  This test is not yet approved or cleared by the [place] FDA and has been authorized for detection and/or diagnosis of SARS-CoV-2 by FDA under an Emergency Use Authorization (EUA). This EUA will remain in effect (meaning this test can be used) for the duration of the COVID-19 declaration under Section 564(b)(1) of the Act, 21 U.S.C. section 360bbb-3(b)(1), unless the authorization is terminated or revoked.     Resp Syncytial Virus by PCR NEGATIVE NEGATIVE Final    Comment: (NOTE) Fact Sheet for Patients: BloggerCourse.com  Fact Sheet for Healthcare Providers: SeriousBroker.it  This test is not yet approved or cleared by the [place] FDA and has been authorized for detection and/or diagnosis of SARS-CoV-2 by FDA under an Emergency Use Authorization (EUA). This EUA will remain in effect (meaning this test can be used) for the duration of the COVID-19 declaration under Section 564(b)(1) of the Act, 21 U.S.C. section 360bbb-3(b)(1), unless the authorization is terminated or revoked.  Performed at [facility], [address], [place], [place] [address]  Radiology Studies: No results found.      Scheduled Meds:  amLODipine  10 mg Oral QAC supper   aspirin EC  81 mg Oral Daily   atorvastatin  20 mg Oral QPC supper   docusate sodium  100 mg Oral BID   heparin  5,000 Units Subcutaneous Q12H   insulin aspart  0-9 Units Subcutaneous TID WC   insulin aspart  3 Units Subcutaneous TID WC   linagliptin  5 mg Oral Daily   lisinopril  20 mg Oral Daily   Continuous Infusions:   LOS: 8 days      Charise Killian, MD [facility] Pager [phone]  [phone]  If 7PM-7AM, please contact night-coverage [email or web address] 04/13/2023, 8:37 AM

## 2023-04-13 NOTE — Plan of Care (Signed)

## 2023-04-14 DIAGNOSIS — N179 Acute kidney failure, unspecified: Secondary | ICD-10-CM | POA: Diagnosis not present

## 2023-04-14 LAB — BASIC METABOLIC PANEL
Anion gap: 5 (ref 5–15)
BUN: 19 mg/dL (ref 8–23)
CO2: 26 mmol/L (ref 22–32)
Calcium: 8.2 mg/dL — ABNORMAL LOW (ref 8.9–10.3)
Chloride: 105 mmol/L (ref 98–111)
Creatinine, Ser: 0.89 mg/dL (ref 0.44–1.00)
GFR, Estimated: >60 mL/min (ref >60)
Glucose, Bld: 150 mg/dL — ABNORMAL HIGH (ref 70–99)
Potassium: 4.5 mmol/L (ref 3.5–5.1)
Sodium: 136 mmol/L (ref 135–145)

## 2023-04-14 LAB — CBC
HCT: 31.2 % — ABNORMAL LOW (ref 36.0–46.0)
Hemoglobin: 10.1 g/dL — ABNORMAL LOW (ref 12.0–15.0)
MCH: 26.9 pg (ref 26.0–34.0)
MCHC: 32.4 g/dL (ref 30.0–36.0)
MCV: 83.2 fL (ref 80.0–100.0)
Platelets: 265 10*3/uL (ref 150–400)
RBC: 3.75 MIL/uL — ABNORMAL LOW (ref 3.87–5.11)
RDW: 14.6 % (ref 11.5–15.5)
WBC: 6.5 10*3/uL (ref 4.0–10.5)
nRBC: 0 % (ref 0.0–0.2)

## 2023-04-14 LAB — GLUCOSE, CAPILLARY
Glucose-Capillary: 177 mg/dL — ABNORMAL HIGH (ref 70–99)
Glucose-Capillary: 315 mg/dL — ABNORMAL HIGH (ref 70–99)

## 2023-04-14 MED ORDER — LINAGLIPTIN 5 MG PO TABS: 5.0000 mg | ORAL_TABLET | Freq: Every day | ORAL | 0 refills | Status: DC

## 2023-04-14 NOTE — TOC Progression Note (Signed)
 Transition of Care [facility]) - Progression Note    Patient Details  Name: Susan [name] MRN: [id number] Date of Birth: [birth year]  [facility] CM/SW Contact  Royanne JINNY Bernheim, RN Phone Number: 04/14/2023, 1:33 PM  Clinical Narrative:    [facility] to go to [facility] room [address] made Son aware, EMS called there are 6 ahead of her        Expected Discharge Plan and Services         Expected Discharge Date: 04/14/23                                     Social Determinants of Health (SDOH) Interventions SDOH Screenings   Food Insecurity: No Food Insecurity (04/05/2023)  Housing: Low Risk  (04/05/2023)  Transportation Needs: No Transportation Needs (04/05/2023)  Utilities: Not At Risk (04/05/2023)  Social Connections: Patient Unable To Answer (04/14/2023)  Tobacco Use: Medium Risk (04/04/2023)    Readmission Risk Interventions     No data to display

## 2023-04-14 NOTE — Plan of Care (Signed)

## 2023-04-14 NOTE — Discharge Summary (Signed)
 Physician Discharge Summary  Susan [name] FMW:[id number] DOB: Oct 12, 1926 DOA: 04/04/2023  PCP: Tobie Domino, MD  Admit date: 04/04/2023 Discharge date: 04/14/2023  Admitted From: home  Disposition:  SNF  Recommendations for Outpatient Follow-up:  Follow up with PCP in 1-2 weeks   Home Health: no  Equipment/Devices:  Discharge Condition: stable CODE STATUS: DNR Diet recommendation: carb modified  Brief/Interim Summary: HPI was taken from Dr. MYRTIS Mana: Susan [name] is a 89 y.o. 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   Symptoms started 5 days ago, patient started floor frequent episodes of nausea no vomiting no diarrhea no abdominal pain, but patient did have significant decrease of p.o. intake including food and water and became dehydrated.  She also had a unwitnessed fall at home 2 to 3 days ago and hit her head, patient reported feeling generalized weakness and lightheaded, but denied any LOC, no blurry vision.  At baseline she is able to transfer herself from wheelchair to bed but because she has become so weak she has not been able to do that.  Denies any cough, no fever or chills.  She has not been taking her medication regularly for the last few days.   ED Course: Blood pressure elevated 150/60, afebrile, not tachycardia nonhypoxic.  CT head and neck negative for acute findings chest x-ray negative for acute infiltrates.  Blood work showed AKI creatinine 1.2 compared to baseline 0.7, glucose 449, BUN 56, K3.8, WBC 14.  UA showed WBC 21-50.   Patient was given IV bolus 1000 x 1 and ceftriaxone  x 1.  Discharge Diagnoses:  Principal Problem:   AKI (acute kidney injury) (HCC) Active Problems:   Acute cystitis with hematuria   Generalized weakness   Hypernatremia   Uncontrolled type 2 diabetes mellitus with  hyperglycemia, without long-term current use of insulin  (HCC)   History of CVA (cerebrovascular accident)   Essential hypertension   Underweight (BMI < 18.5)  AKI: resolved    Acute cystitis: with hematuria. Completed rocephin . Resolved    Generalized weakness: PT recs SNF    Hypernatremia: resolved   DM2: poorly controlled. Continue on anti-DM meds   Hx of CVA: continue on statin, aspirin     HTN: continue on lisinopril , amlodipine     Underweight: BMI 18.0. Encourage po intake. Likely severe protein calorie malnutrition   Discharge Instructions  Discharge Instructions     Diet general   Complete by: As directed    Discharge instructions   Complete by: As directed    F/u w/ PCP in 1-2 weeks   Discharge wound care:   Complete by: As directed    Wound care  Daily      Comments: Apply single layer of xeroform to the coccyx wound, top with foam. Change every other day   Increase activity slowly   Complete by: As directed       Allergies as of 04/14/2023       Reactions   Atenolol Other (See Comments)   affected my heart rate   Naprosyn [naproxen] Other (See Comments)   GI Bleed        Medication List     STOP taking these medications    Calcium  Carbonate-Vitamin D  600-400 MG-UNIT tablet   latanoprost 0.005 % ophthalmic solution Commonly known as: XALATAN       TAKE these medications  amLODipine  10 MG tablet Commonly known as: NORVASC  Take 10 mg by mouth daily before supper.   ascorbic acid  500 MG tablet Commonly known as: VITAMIN C  Take 500 mg by mouth every evening.   aspirin  EC 81 MG tablet Take 81 mg by mouth daily. Swallow whole.   atorvastatin  20 MG tablet Commonly known as: LIPITOR Take 20 mg by mouth daily after supper.   ferrous sulfate  325 (65 FE) MG tablet Take 1 tablet (325 mg total) by mouth every other day.   glipiZIDE  5 MG 24 hr tablet Commonly known as: GLUCOTROL  XL Take 5 mg by mouth 2 (two) times daily.    hydrochlorothiazide 12.5 MG capsule Commonly known as: MICROZIDE Take 12.5 mg by mouth daily.   Jardiance 10 MG Tabs tablet Generic drug: empagliflozin Take 10 mg by mouth daily.   linagliptin  5 MG Tabs tablet Commonly known as: TRADJENTA  Take 1 tablet (5 mg total) by mouth daily. Start taking on: April 15, 2023   lisinopril  40 MG tablet Commonly known as: ZESTRIL  Take 40 mg by mouth daily.   metFORMIN 500 MG tablet Commonly known as: GLUCOPHAGE Take 500 mg by mouth 2 (two) times daily with a meal.   multivitamin with minerals Tabs tablet Take 1 tablet by mouth daily.               Discharge Care Instructions  (From admission, onward)           Start     Ordered   04/14/23 0000  Discharge wound care:       Comments: Wound care  Daily      Comments: Apply single layer of xeroform to the coccyx wound, top with foam. Change every other day   04/14/23 1321            Contact information for after-discharge care     Destination     [facility] Preferred SNF .   Service: Skilled Nursing Contact information: [address] [place]  [phone]                    Allergies  Allergen Reactions   Atenolol Other (See Comments)    affected my heart rate   Naprosyn [Naproxen] Other (See Comments)    GI Bleed    Consultations:    Procedures/Studies: DG Chest Port 1 View Result Date: 04/04/2023 CLINICAL DATA:  Weakness.  Leukocytosis.  Evaluate for infection. EXAM: PORTABLE CHEST 1 VIEW COMPARISON:  02/22/2022 FINDINGS: Normal cardiomediastinal contours. The lungs are hyperinflated. No pleural effusion, interstitial edema or airspace disease. Chronic left posterolateral rib fracture deformities as noted previously. IMPRESSION: 1. No acute findings. Electronically Signed   By: Waddell Calk M.D.   On: 04/04/2023 06:18   CT HEAD WO CONTRAST Result Date: 04/04/2023 CLINICAL DATA:  Slurred  speech EXAM: CT HEAD WITHOUT CONTRAST TECHNIQUE: Contiguous axial images were obtained from the base of the skull through the vertex without intravenous contrast. RADIATION DOSE REDUCTION: This exam was performed according to the departmental dose-optimization program which includes automated exposure control, adjustment of the mA and/or kV according to patient size and/or use of iterative reconstruction technique. COMPARISON:  02/22/2022 FINDINGS: Brain: There is no mass, hemorrhage or extra-axial collection. There is generalized atrophy without lobar predilection. Hypodensity of the white matter is most commonly associated with chronic microvascular disease. Vascular: Atherosclerotic calcification of the vertebral and internal carotid arteries at the skull base. No abnormal hyperdensity of the  major intracranial arteries or dural venous sinuses. Skull: The visualized skull base, calvarium and extracranial soft tissues are normal. Sinuses/Orbits: No fluid levels or advanced mucosal thickening of the visualized paranasal sinuses. No mastoid or middle ear effusion. Normal orbits. Other: None. IMPRESSION: 1. No acute intracranial abnormality. 2. Generalized atrophy and findings of chronic microvascular disease. Electronically Signed   By: Franky Stanford M.D.   On: 04/04/2023 02:12   (Echo, Carotid, EGD, Colonoscopy, ERCP)    Subjective: Pt c/o fatigue   Discharge Exam: Vitals:   04/14/23 0008 04/14/23 0806  BP: (!) 124/56 134/66  Pulse: 74 75  Resp: 17 20  Temp: 99 F (37.2 C) 98 F (36.7 C)  SpO2: 94% 96%   Vitals:   04/13/23 0905 04/13/23 1543 04/14/23 0008 04/14/23 0806  BP: (!) 152/47 (!) 117/51 (!) 124/56 134/66  Pulse: 89 80 74 75  Resp: 18 16 17 20   Temp: (!) 97.5 F (36.4 C) 98.6 F (37 C) 99 F (37.2 C) 98 F (36.7 C)  TempSrc: Axillary  Oral   SpO2: 98% 100% 94% 96%  Weight:      Height:        General: Pt is alert, awake, not in acute distress Cardiovascular: S1/S2 +, no  rubs, no gallops Respiratory: CTA bilaterally, no wheezing, no rhonchi Abdominal: Soft, NT, ND, bowel sounds + Extremities: no edema, no cyanosis    The results of significant diagnostics from this hospitalization (including imaging, microbiology, ancillary and laboratory) are listed below for reference.     Microbiology: No results found for this or any previous visit (from the past 240 hours).   Labs: BNP (last 3 results) No results for input(s): BNP in the last 8760 hours. Basic Metabolic Panel: Recent Labs  Lab 04/10/23 0531 04/14/23 0439  NA 136 136  K 3.8 4.5  CL 105 105  CO2 21* 26  GLUCOSE 197* 150*  BUN 21 19  CREATININE 0.81 0.89  CALCIUM  8.0* 8.2*   Liver Function Tests: No results for input(s): AST, ALT, ALKPHOS, BILITOT, PROT, ALBUMIN in the last 168 hours. No results for input(s): LIPASE, AMYLASE in the last 168 hours. No results for input(s): AMMONIA in the last 168 hours. CBC: Recent Labs  Lab 04/10/23 0531 04/14/23 0439  WBC 7.8 6.5  HGB 11.2* 10.1*  HCT 35.5* 31.2*  MCV 85.1 83.2  PLT 235 265   Cardiac Enzymes: No results for input(s): CKTOTAL, CKMB, CKMBINDEX, TROPONINI in the last 168 hours. BNP: Invalid input(s): POCBNP CBG: Recent Labs  Lab 04/13/23 1119 04/13/23 1629 04/13/23 2102 04/14/23 0810 04/14/23 1143  GLUCAP 254* 206* 135* 177* 315*   D-Dimer No results for input(s): DDIMER in the last 72 hours. Hgb A1c No results for input(s): HGBA1C in the last 72 hours. Lipid Profile No results for input(s): CHOL, HDL, LDLCALC, TRIG, CHOLHDL, LDLDIRECT in the last 72 hours. Thyroid function studies No results for input(s): TSH, T4TOTAL, T3FREE, THYROIDAB in the last 72 hours.  Invalid input(s): FREET3 Anemia work up No results for input(s): VITAMINB12, FOLATE, FERRITIN, TIBC, IRON , RETICCTPCT in the last 72 hours. Urinalysis    Component Value Date/Time    COLORURINE YELLOW (A) 04/04/2023 0639   APPEARANCEUR HAZY (A) 04/04/2023 0639   APPEARANCEUR Clear 08/24/2012 1203   LABSPEC 1.026 04/04/2023 0639   LABSPEC 1.017 08/24/2012 1203   PHURINE 5.0 04/04/2023 0639   GLUCOSEU >=500 (A) 04/04/2023 0639   GLUCOSEU 50 mg/dL 94/86/7985 8796   HGBUR MODERATE (A) 04/04/2023 9360  BILIRUBINUR NEGATIVE 04/04/2023 0639   BILIRUBINUR Negative 08/24/2012 1203   KETONESUR NEGATIVE 04/04/2023 0639   PROTEINUR NEGATIVE 04/04/2023 0639   NITRITE NEGATIVE 04/04/2023 0639   LEUKOCYTESUR TRACE (A) 04/04/2023 0639   LEUKOCYTESUR Trace 08/24/2012 1203   Sepsis Labs Recent Labs  Lab 04/10/23 0531 04/14/23 0439  WBC 7.8 6.5   Microbiology No results found for this or any previous visit (from the past 240 hours).   Time coordinating discharge: Over 30 minutes  SIGNED:   Anthony CHRISTELLA Pouch, MD  [facility] 04/14/2023, 1:21 PM Pager   If 7PM-7AM, please contact night-coverage [email or web address]

## 2023-04-16 ENCOUNTER — Ambulatory Visit: Payer: Medicare Other | Admitting: Occupational Therapy

## 2023-04-20 ENCOUNTER — Ambulatory Visit: Payer: Medicare Other | Admitting: Occupational Therapy

## 2023-04-20 ENCOUNTER — Ambulatory Visit: Payer: Medicare Other

## 2023-04-22 ENCOUNTER — Ambulatory Visit: Payer: Medicare Other

## 2023-04-22 ENCOUNTER — Encounter: Payer: Medicare Other | Admitting: Occupational Therapy

## 2023-04-27 ENCOUNTER — Encounter: Payer: Medicare Other | Admitting: Occupational Therapy

## 2023-04-27 ENCOUNTER — Ambulatory Visit: Payer: Medicare Other

## 2023-04-29 ENCOUNTER — Ambulatory Visit: Payer: Medicare Other

## 2023-04-29 ENCOUNTER — Encounter: Payer: Medicare Other | Admitting: Occupational Therapy

## 2023-05-04 ENCOUNTER — Encounter: Payer: Medicare Other | Admitting: Occupational Therapy

## 2023-05-04 ENCOUNTER — Ambulatory Visit: Payer: Medicare Other

## 2023-05-06 ENCOUNTER — Encounter: Payer: Medicare Other | Admitting: Occupational Therapy

## 2023-05-06 ENCOUNTER — Ambulatory Visit: Payer: Medicare Other

## 2023-05-11 ENCOUNTER — Ambulatory Visit: Payer: Medicare Other

## 2023-05-11 ENCOUNTER — Encounter: Payer: Medicare Other | Admitting: Occupational Therapy

## 2023-05-12 ENCOUNTER — Emergency Department
Admission: EM | Admit: 2023-05-12 | Discharge: 2023-05-12 | Disposition: A | Discharge status: Home / Self Care | Payer: Medicare Other | Attending: Emergency Medicine | Admitting: Emergency Medicine

## 2023-05-12 ENCOUNTER — Emergency Department: Payer: Medicare Other

## 2023-05-12 ENCOUNTER — Encounter: Payer: Self-pay | Admitting: Emergency Medicine

## 2023-05-12 ENCOUNTER — Other Ambulatory Visit: Payer: Self-pay

## 2023-05-12 DIAGNOSIS — E119 Type 2 diabetes mellitus without complications: Secondary | ICD-10-CM | POA: Insufficient documentation

## 2023-05-12 DIAGNOSIS — W050XXA Fall from non-moving wheelchair, initial encounter: Secondary | ICD-10-CM | POA: Diagnosis not present

## 2023-05-12 DIAGNOSIS — S0003XA Contusion of scalp, initial encounter: Secondary | ICD-10-CM | POA: Insufficient documentation

## 2023-05-12 DIAGNOSIS — S0990XA Unspecified injury of head, initial encounter: Secondary | ICD-10-CM | POA: Diagnosis present

## 2023-05-12 DIAGNOSIS — I10 Essential (primary) hypertension: Secondary | ICD-10-CM | POA: Insufficient documentation

## 2023-05-12 NOTE — ED Provider Notes (Signed)
[facility] Provider Note    Event Date/Time   First MD Initiated Contact with Patient 05/12/23 1122     (approximate)   History   Chief Complaint Fall and Head Injury   HPI  Susan [name] is a 89 y.o. female with past medical history of hypertension, diabetes, and stroke presents to the ED following fall.  Patient reports that she was being wheeled to the bathroom at home when the front of her wheelchair got stuck in a groove, causing her to be thrown forward.  She states that she struck her head on the doorway, did not lose consciousness.  She reports minimal pain following the fall, however her caregiver insisted that she be evaluated.  She denies headache, neck pain, chest pain, abdominal pain, or extremity pain.  She does not take a blood thinner.     Physical Exam   Triage Vital Signs: ED Triage Vitals  Encounter Vitals Group     BP 05/12/23 0825 (!) 157/60     Systolic BP Percentile --      Diastolic BP Percentile --      Pulse Rate 05/12/23 0825 70     Resp 05/12/23 0825 17     Temp 05/12/23 0825 97.7 F (36.5 C)     Temp Source 05/12/23 0825 Oral     SpO2 05/12/23 0825 97 %     Weight 05/12/23 0833 110 lb (49.9 kg)     Height 05/12/23 0833 5\' 6"  (1.676 m)     Head Circumference --      [facility] Flow --      Pain Score 05/12/23 0832 0     Pain Loc --      Pain Education --      Exclude from Growth Chart --     Most recent vital signs: Vitals:   05/12/23 0825  BP: (!) 157/60  Pulse: 70  Resp: 17  Temp: 97.7 F (36.5 C)  SpO2: 97%    Constitutional: Alert and oriented. Eyes: Conjunctivae are normal. Head: Small hematoma over right frontal scalp with no step-off. Nose: No congestion/rhinnorhea. Mouth/Throat: Mucous membranes are moist.  Neck: No midline cervical spine tenderness to palpation. Cardiovascular: Normal rate, regular rhythm. Grossly normal heart sounds.  2+ radial pulses bilaterally. Respiratory: Normal respiratory  effort.  No retractions. Lungs CTAB. Gastrointestinal: Soft and nontender. No distention. Musculoskeletal: No lower extremity tenderness nor edema.  Neurologic:  Normal speech and language. No gross focal neurologic deficits are appreciated.    ED Results / Procedures / Treatments   Labs (all labs ordered are listed, but only abnormal results are displayed) Labs Reviewed - No data to display  RADIOLOGY CT head reviewed and interpreted by me with no hemorrhage or midline shift.  PROCEDURES:  Critical Care performed: No  Procedures   MEDICATIONS ORDERED IN ED: Medications - No data to display   IMPRESSION / MDM / ASSESSMENT AND PLAN / ED COURSE  I reviewed the triage vital signs and the nursing notes.                              89 y.o. female with past medical history of hypertension, diabetes, and stroke who presents to the ED following a fall where she was thrown forward from her wheelchair and hit her head on a doorway.  Patient's presentation is most consistent with acute complicated illness / injury requiring diagnostic workup.  Differential diagnosis includes, but is not limited to, intracranial injury, cervical spine injury, scalp contusion.  Patient nontoxic-appearing and in no acute distress, vital signs are unremarkable.  She has a small hematoma to her right frontal scalp, however CT imaging of head and cervical spine is negative for acute process.  No evidence of traumatic injury to her trunk or extremities, patient denies any complaints at this time and is at her baseline mental status.  She is appropriate for discharge home, was counseled to return to the ED for new or worsening symptoms.  Patient agrees with plan.      FINAL CLINICAL IMPRESSION(S) / ED DIAGNOSES   Final diagnoses:  Injury of head, initial encounter  Hematoma of scalp, initial encounter     Rx / DC Orders   ED Discharge Orders     None        Note:  This document was prepared  using Dragon voice recognition software and may include unintentional dictation errors.   Chesley Noon, MD 05/12/23 1147

## 2023-05-12 NOTE — ED Notes (Signed)
See triage note  Presents s/p fall   States she fell from w/c  small hematoma noted to forehead  No LOC

## 2023-05-12 NOTE — ED Triage Notes (Signed)
Pt arrives via ems from an independent living, pt states that she was going into the bathroom with her wheelchair, pt states that she had gotten to the door and usually keeps herself tied in the wheelchair, states that she fell forward out of the chair and hit her head on the corner of the door frame, denies loc, pt has a small hematoma to the right forehead, pt denies head and neck pain

## 2023-05-13 ENCOUNTER — Encounter: Payer: Medicare Other | Admitting: Occupational Therapy

## 2023-05-13 ENCOUNTER — Ambulatory Visit: Payer: Medicare Other

## 2023-05-18 ENCOUNTER — Encounter: Payer: Medicare Other | Admitting: Occupational Therapy

## 2023-05-18 ENCOUNTER — Ambulatory Visit: Payer: Medicare Other

## 2023-05-20 ENCOUNTER — Ambulatory Visit: Payer: Medicare Other

## 2023-05-20 ENCOUNTER — Encounter: Payer: Medicare Other | Admitting: Occupational Therapy

## 2023-05-25 ENCOUNTER — Encounter: Payer: Medicare Other | Admitting: Occupational Therapy

## 2023-05-25 ENCOUNTER — Ambulatory Visit: Payer: Medicare Other

## 2023-05-27 ENCOUNTER — Ambulatory Visit: Payer: Medicare Other

## 2023-05-27 ENCOUNTER — Ambulatory Visit: Payer: Medicare Other | Admitting: Occupational Therapy

## 2023-06-01 ENCOUNTER — Ambulatory Visit: Payer: Medicare Other

## 2023-06-01 ENCOUNTER — Encounter: Payer: Medicare Other | Admitting: Occupational Therapy

## 2023-06-03 ENCOUNTER — Encounter: Payer: Medicare Other | Admitting: Occupational Therapy

## 2023-06-03 ENCOUNTER — Ambulatory Visit: Payer: Medicare Other

## 2023-06-08 ENCOUNTER — Encounter: Payer: Medicare Other | Admitting: Occupational Therapy

## 2023-06-08 ENCOUNTER — Ambulatory Visit: Payer: Medicare Other

## 2023-06-10 ENCOUNTER — Encounter: Payer: Medicare Other | Admitting: Occupational Therapy

## 2023-06-10 ENCOUNTER — Ambulatory Visit: Payer: Medicare Other

## 2023-06-15 ENCOUNTER — Encounter: Payer: Medicare Other | Admitting: Occupational Therapy

## 2023-06-15 ENCOUNTER — Ambulatory Visit: Payer: Medicare Other

## 2023-06-17 ENCOUNTER — Ambulatory Visit: Payer: Medicare Other

## 2023-06-17 ENCOUNTER — Encounter: Payer: Medicare Other | Admitting: Occupational Therapy

## 2023-06-22 ENCOUNTER — Ambulatory Visit: Payer: Medicare Other

## 2023-06-22 ENCOUNTER — Encounter: Payer: Medicare Other | Admitting: Occupational Therapy

## 2023-06-24 ENCOUNTER — Ambulatory Visit: Payer: Medicare Other

## 2023-06-24 ENCOUNTER — Encounter: Payer: Medicare Other | Admitting: Occupational Therapy

## 2023-06-25 ENCOUNTER — Emergency Department

## 2023-06-25 ENCOUNTER — Emergency Department
Admission: EM | Admit: 2023-06-25 | Discharge: 2023-06-26 | Disposition: A | Discharge status: Home / Self Care | Attending: Emergency Medicine | Admitting: Emergency Medicine

## 2023-06-25 DIAGNOSIS — W19XXXA Unspecified fall, initial encounter: Secondary | ICD-10-CM

## 2023-06-25 DIAGNOSIS — S0003XA Contusion of scalp, initial encounter: Secondary | ICD-10-CM | POA: Diagnosis not present

## 2023-06-25 DIAGNOSIS — E119 Type 2 diabetes mellitus without complications: Secondary | ICD-10-CM | POA: Diagnosis not present

## 2023-06-25 DIAGNOSIS — W01190A Fall on same level from slipping, tripping and stumbling with subsequent striking against furniture, initial encounter: Secondary | ICD-10-CM | POA: Insufficient documentation

## 2023-06-25 DIAGNOSIS — I10 Essential (primary) hypertension: Secondary | ICD-10-CM | POA: Diagnosis not present

## 2023-06-25 DIAGNOSIS — Z8673 Personal history of transient ischemic attack (TIA), and cerebral infarction without residual deficits: Secondary | ICD-10-CM | POA: Diagnosis not present

## 2023-06-25 DIAGNOSIS — S0990XA Unspecified injury of head, initial encounter: Secondary | ICD-10-CM | POA: Diagnosis present

## 2023-06-25 DIAGNOSIS — Y92000 Kitchen of unspecified non-institutional (private) residence as  the place of occurrence of the external cause: Secondary | ICD-10-CM | POA: Diagnosis not present

## 2023-06-25 NOTE — ED Provider Notes (Signed)
-----------------------------------------   11:58 PM on 06/25/2023 ----------------------------------------- Patient care assumed from Dr. Larinda Buttery.  CT scans have resulted showing no acute intracranial abnormality.  No cervical spine fracture.  Wrist x-ray shows soft tissue edema without acute fracture.  Given the patient's reassuring workup I believe the patient safe for discharge home.  We will place in a removable Velcro wrist splint for comfort and have the patient follow-up with her doctor.  Patient/caregiver agreeable to plan.   Minna Antis, MD 06/25/23 [phone]

## 2023-06-25 NOTE — ED Provider Notes (Signed)
 [facility] Provider Note    Event Date/Time   First MD Initiated Contact with Patient 06/25/23 2041     (approximate)   History   Chief Complaint Fall   HPI  Susan [name] is a 89 y.o. female with past medical history of hypertension, diabetes, and stroke who presents to the ED following a fall.  Patient reports that she was bending over in the kitchen, but when she went to stand up she struck her head on the open cabinet door.  Caregiver at bedside reports that patient briefly lost consciousness, but she does not take any blood thinners.  Caregiver reports that patient also seem to injure her right wrist, where she has noticed some bruising and swelling.  Patient currently denies significant headache, neck pain, or extremity pain.     Physical Exam   Triage Vital Signs: ED Triage Vitals [06/25/23 2035]  Encounter Vitals Group     BP (!) 172/73     Systolic BP Percentile      Diastolic BP Percentile      Pulse Rate 95     Resp 16     Temp 98.6 F (37 C)     Temp Source Oral     SpO2 98 %     Weight      Height      Head Circumference      Peak Flow      Pain Score      Pain Loc      Pain Education      Exclude from Growth Chart     Most recent vital signs: Vitals:   06/25/23 2035 06/25/23 2040  BP: (!) 172/73 (!) 167/92  Pulse: 95 79  Resp: 16 16  Temp: 98.6 F (37 C) 98.6 F (37 C)  SpO2: 98% 98%    Constitutional: Alert and oriented. Eyes: Conjunctivae are normal. Head: Frontal scalp hematoma noted with no laceration. Nose: No congestion/rhinnorhea. Mouth/Throat: Mucous membranes are moist.  Neck: No midline cervical spine tenderness to palpation. Cardiovascular: Normal rate, regular rhythm. Grossly normal heart sounds.  2+ radial pulses bilaterally. Respiratory: Normal respiratory effort.  No retractions. Lungs CTAB. Gastrointestinal: Soft and nontender. No distention. Musculoskeletal: No lower extremity tenderness nor  edema.  Diffuse tenderness to palpation at right wrist with small hematoma but no obvious deformity. Neurologic:  Normal speech and language. No gross focal neurologic deficits are appreciated.    ED Results / Procedures / Treatments   Labs (all labs ordered are listed, but only abnormal results are displayed) Labs Reviewed - No data to display   EKG  ED ECG REPORT I, Chesley Noon, the attending physician, personally viewed and interpreted this ECG.   Date: 06/25/2023  EKG Time: 20:42  Rate: 90  Rhythm: normal sinus rhythm  Axis: Normal  Intervals: Incomplete RBBB  ST&T Change: Nonspecific ST/T abnormality  RADIOLOGY CT head reviewed and interpreted by me with no hemorrhage or midline shift.  PROCEDURES:  Critical Care performed: No  Procedures   MEDICATIONS ORDERED IN ED: Medications - No data to display   IMPRESSION / MDM / ASSESSMENT AND PLAN / ED COURSE  I reviewed the triage vital signs and the nursing notes.                              89 y.o. female with past medical history of hypertension, diabetes, and stroke who presents to the  ED after striking her head on an open kitchen cabinet, subsequently briefly lost consciousness.  Patient's presentation is most consistent with acute complicated illness / injury requiring diagnostic workup.  Differential diagnosis includes, but is not limited to, intracranial injury, cervical spine injury, wrist fracture, dislocation, contusion.  Patient nontoxic-appearing and in no acute distress, vital signs are unremarkable.  She is at her baseline mental status, does have a frontal scalp hematoma and we will check CT head and cervical spine.  She also has hematoma to her right wrist, x-ray pending.  No evidence of traumatic injury to her other extremities, chest, or abdomen.  Patient turned over to oncoming provider pending CT and x-ray results.      FINAL CLINICAL IMPRESSION(S) / ED DIAGNOSES   Final diagnoses:   Fall, initial encounter  Hematoma of scalp, initial encounter     Rx / DC Orders   ED Discharge Orders     None        Note:  This document was prepared using Dragon voice recognition software and may include unintentional dictation errors.   Chesley Noon, MD 06/25/23 2240

## 2023-06-26 NOTE — ED Notes (Signed)
 Called [facility] to arrange transport home spoke to [facility] @[id number]  provided information requested waiting on EMS to arrive

## 2023-06-26 NOTE — Discharge Instructions (Signed)
 You may wear your wrist splint as needed for comfort for the next 2 weeks, then please discontinue use.  Please follow-up with your primary care doctor within the next few days for recheck/reevaluation.  Return to the emergency department for any symptom personally concerning to yourself or caregiver.

## 2023-06-26 NOTE — ED Notes (Signed)
 Pt's right arm and wrist fitted with wrist splint,  CMS intact after application.

## 2023-06-29 ENCOUNTER — Ambulatory Visit: Payer: Medicare Other

## 2023-06-29 ENCOUNTER — Encounter: Payer: Medicare Other | Admitting: Occupational Therapy

## 2023-07-01 ENCOUNTER — Encounter: Payer: Medicare Other | Admitting: Occupational Therapy

## 2023-07-01 ENCOUNTER — Ambulatory Visit: Payer: Medicare Other

## 2023-07-06 ENCOUNTER — Ambulatory Visit: Payer: Medicare Other

## 2023-07-06 ENCOUNTER — Encounter: Payer: Medicare Other | Admitting: Occupational Therapy

## 2023-07-13 ENCOUNTER — Other Ambulatory Visit: Payer: Self-pay

## 2023-07-13 DIAGNOSIS — R636 Underweight: Secondary | ICD-10-CM | POA: Diagnosis not present

## 2023-07-13 DIAGNOSIS — Z87891 Personal history of nicotine dependence: Secondary | ICD-10-CM | POA: Diagnosis not present

## 2023-07-13 DIAGNOSIS — K922 Gastrointestinal hemorrhage, unspecified: Secondary | ICD-10-CM | POA: Insufficient documentation

## 2023-07-13 DIAGNOSIS — Z681 Body mass index (BMI) 19 or less, adult: Secondary | ICD-10-CM | POA: Insufficient documentation

## 2023-07-13 DIAGNOSIS — E1165 Type 2 diabetes mellitus with hyperglycemia: Secondary | ICD-10-CM | POA: Insufficient documentation

## 2023-07-13 DIAGNOSIS — Z7984 Long term (current) use of oral hypoglycemic drugs: Secondary | ICD-10-CM | POA: Insufficient documentation

## 2023-07-13 DIAGNOSIS — Z8673 Personal history of transient ischemic attack (TIA), and cerebral infarction without residual deficits: Secondary | ICD-10-CM | POA: Insufficient documentation

## 2023-07-13 DIAGNOSIS — Z7982 Long term (current) use of aspirin: Secondary | ICD-10-CM | POA: Insufficient documentation

## 2023-07-13 DIAGNOSIS — I10 Essential (primary) hypertension: Secondary | ICD-10-CM | POA: Insufficient documentation

## 2023-07-13 DIAGNOSIS — I69393 Ataxia following cerebral infarction: Secondary | ICD-10-CM | POA: Insufficient documentation

## 2023-07-13 DIAGNOSIS — I69320 Aphasia following cerebral infarction: Secondary | ICD-10-CM | POA: Diagnosis not present

## 2023-07-13 DIAGNOSIS — Z79899 Other long term (current) drug therapy: Secondary | ICD-10-CM | POA: Insufficient documentation

## 2023-07-13 DIAGNOSIS — K921 Melena: Principal | ICD-10-CM | POA: Insufficient documentation

## 2023-07-13 DIAGNOSIS — K625 Hemorrhage of anus and rectum: Secondary | ICD-10-CM | POA: Diagnosis present

## 2023-07-13 DIAGNOSIS — Z993 Dependence on wheelchair: Secondary | ICD-10-CM | POA: Insufficient documentation

## 2023-07-13 LAB — COMPREHENSIVE METABOLIC PANEL WITH GFR
ALT: 16 U/L (ref 0–44)
AST: 26 U/L (ref 15–41)
Albumin: 3.6 g/dL (ref 3.5–5.0)
Alkaline Phosphatase: 54 U/L (ref 38–126)
Anion gap: 7 (ref 5–15)
BUN: 31 mg/dL — ABNORMAL HIGH (ref 8–23)
CO2: 26 mmol/L (ref 22–32)
Calcium: 9.6 mg/dL (ref 8.9–10.3)
Chloride: 106 mmol/L (ref 98–111)
Creatinine, Ser: 0.87 mg/dL (ref 0.44–1.00)
GFR, Estimated: >60 mL/min (ref >60)
Glucose, Bld: 192 mg/dL — ABNORMAL HIGH (ref 70–99)
Potassium: 4.4 mmol/L (ref 3.5–5.1)
Sodium: 139 mmol/L (ref 135–145)
Total Bilirubin: 0.4 mg/dL (ref 0.0–1.2)
Total Protein: 7.7 g/dL (ref 6.5–8.1)

## 2023-07-13 LAB — TYPE AND SCREEN
ABO/RH(D): O POS
Antibody Screen: NEGATIVE

## 2023-07-13 LAB — CBC
HCT: 32.5 % — ABNORMAL LOW (ref 36.0–46.0)
Hemoglobin: 10.3 g/dL — ABNORMAL LOW (ref 12.0–15.0)
MCH: 26.8 pg (ref 26.0–34.0)
MCHC: 31.7 g/dL (ref 30.0–36.0)
MCV: 84.4 fL (ref 80.0–100.0)
Platelets: 219 10*3/uL (ref 150–400)
RBC: 3.85 MIL/uL — ABNORMAL LOW (ref 3.87–5.11)
RDW: 15.9 % — ABNORMAL HIGH (ref 11.5–15.5)
WBC: 7.6 10*3/uL (ref 4.0–10.5)
nRBC: 0 % (ref 0.0–0.2)

## 2023-07-13 NOTE — ED Triage Notes (Signed)
 Pt POV from home with Nurse Aid/ Caretaker d/t rectal bleeding since 1200 today. - dark red blood on pants.   Pt concerned for Diverticulitis.

## 2023-07-13 NOTE — ED Notes (Signed)
 Type and screen and rainbow sent to the lab at this time.

## 2023-07-14 ENCOUNTER — Emergency Department

## 2023-07-14 ENCOUNTER — Encounter: Payer: Self-pay | Admitting: Internal Medicine

## 2023-07-14 ENCOUNTER — Observation Stay
Admission: EM | Admit: 2023-07-14 | Discharge: 2023-07-15 | Disposition: A | Discharge status: Home / Self Care | Attending: Internal Medicine | Admitting: Internal Medicine

## 2023-07-14 DIAGNOSIS — K5790 Diverticulosis of intestine, part unspecified, without perforation or abscess without bleeding: Secondary | ICD-10-CM | POA: Diagnosis not present

## 2023-07-14 DIAGNOSIS — Z8673 Personal history of transient ischemic attack (TIA), and cerebral infarction without residual deficits: Secondary | ICD-10-CM

## 2023-07-14 DIAGNOSIS — K625 Hemorrhage of anus and rectum: Principal | ICD-10-CM | POA: Diagnosis present

## 2023-07-14 DIAGNOSIS — E1165 Type 2 diabetes mellitus with hyperglycemia: Secondary | ICD-10-CM | POA: Diagnosis present

## 2023-07-14 DIAGNOSIS — Z681 Body mass index (BMI) 19 or less, adult: Secondary | ICD-10-CM

## 2023-07-14 DIAGNOSIS — K921 Melena: Secondary | ICD-10-CM | POA: Diagnosis present

## 2023-07-14 DIAGNOSIS — I10 Essential (primary) hypertension: Secondary | ICD-10-CM | POA: Diagnosis not present

## 2023-07-14 DIAGNOSIS — R636 Underweight: Secondary | ICD-10-CM

## 2023-07-14 DIAGNOSIS — I639 Cerebral infarction, unspecified: Secondary | ICD-10-CM | POA: Diagnosis present

## 2023-07-14 DIAGNOSIS — K922 Gastrointestinal hemorrhage, unspecified: Secondary | ICD-10-CM | POA: Diagnosis present

## 2023-07-14 LAB — GLUCOSE, CAPILLARY
Glucose-Capillary: 242 mg/dL — ABNORMAL HIGH (ref 70–99)
Glucose-Capillary: 264 mg/dL — ABNORMAL HIGH (ref 70–99)

## 2023-07-14 LAB — HEMOGLOBIN AND HEMATOCRIT, BLOOD
HCT: 33.6 % — ABNORMAL LOW (ref 36.0–46.0)
HCT: 32.8 % — ABNORMAL LOW (ref 36.0–46.0)
Hemoglobin: 10.6 g/dL — ABNORMAL LOW (ref 12.0–15.0)
Hemoglobin: 10.5 g/dL — ABNORMAL LOW (ref 12.0–15.0)

## 2023-07-14 MED ORDER — ONDANSETRON HCL 4 MG PO TABS: 4.0000 mg | ORAL_TABLET | Freq: Four times a day (QID) | ORAL | Status: DC | PRN

## 2023-07-14 MED ORDER — IOHEXOL 300 MG/ML  SOLN
75.0000 mL | Freq: Once | INTRAMUSCULAR | Status: AC | PRN
  Administered 2023-07-14: 75 mL via INTRAVENOUS

## 2023-07-14 MED ORDER — ONDANSETRON HCL 4 MG/2ML IJ SOLN: 4.0000 mg | Freq: Four times a day (QID) | INTRAMUSCULAR | Status: DC | PRN

## 2023-07-14 MED ORDER — INSULIN ASPART 100 UNIT/ML IJ SOLN
0.0000 [IU] | Freq: Three times a day (TID) | INTRAMUSCULAR | Status: DC
  Filled 2023-07-14: qty 1

## 2023-07-14 MED ORDER — SODIUM CHLORIDE 0.9 % IV SOLN: INTRAVENOUS | Status: DC

## 2023-07-14 NOTE — Assessment & Plan Note (Addendum)
 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

## 2023-07-14 NOTE — Assessment & Plan Note (Addendum)
 BP stable Titrate home regimen

## 2023-07-14 NOTE — Assessment & Plan Note (Signed)
 Blood sugar in 190s SSI Monitor

## 2023-07-14 NOTE — ED Notes (Signed)
 This RN gave report to [employer] and performed care handoff. Call light in reach, bed wheels locked, side rail raised, pt updated on plan of care. Rounding completed.

## 2023-07-14 NOTE — ED Notes (Signed)
 MD at bedside.

## 2023-07-14 NOTE — Assessment & Plan Note (Signed)
 Diverticular bleeding Bloody bowel movements x 2 in the setting of extensive diverticular disease on CT imaging Recurring issue Hemoglobin stable at 10.3 Will monitor for now Trend hemoglobin Transfuse hemoglobin from 7 Consider formal GI consultation if significant decompensation Monitor

## 2023-07-14 NOTE — ED Notes (Signed)
 This RN introduced self to pt. Call light in reach, bed wheels locked, side rails raised, pt updated on plan of care. Rounding completed. High fall risk precautions put in place. Family bedside.

## 2023-07-14 NOTE — ED Provider Notes (Signed)
 [facility] Provider Note    Event Date/Time   First MD Initiated Contact with Patient 07/14/23 0230     (approximate)   History   Rectal Bleeding   HPI  Susan [name] is a 89 y.o. female brought to the ED from home with her nurse aide with a chief complaint of rectal bleeding x 1 day.  Denies anticoagulant use.  History of diverticulitis.  Denies fever/chills, chest pain, shortness of breath, abdominal pain, nausea, vomiting or dizziness.     Past Medical History   Past Medical History:  Diagnosis Date   Anemia    Balance problem    Diabetes mellitus without complication (HCC)    Diverticulosis    Heart murmur    Hypercholesteremia    Hypertension    Stroke [facility])    TIA 2006   TIA (transient ischemic attack) 2006     Active Problem List   Patient Active Problem List   Diagnosis Date Noted   Underweight (BMI < 18.5) 04/09/2023   Acute cystitis with hematuria 04/06/2023   Generalized weakness 04/06/2023   Hypernatremia 04/06/2023   Uncontrolled type 2 diabetes mellitus with hyperglycemia, without long-term current use of insulin (HCC) 04/06/2023   History of CVA (cerebrovascular accident) 04/06/2023   Essential hypertension 04/06/2023   AKI (acute kidney injury) (HCC) 04/04/2023   Renal lesion, right 10/30/2021   Acute blood loss anemia 10/30/2021   Lower GI bleed 10/27/2021   GI bleeding 05/29/2021   CVA (cerebral vascular accident) (HCC) 07/04/2017     Past Surgical History   Past Surgical History:  Procedure Laterality Date   CATARACT EXTRACTION W/PHACO Left 05/22/2015   Procedure: CATARACT EXTRACTION PHACO AND INTRAOCULAR LENS PLACEMENT (IOC);  Surgeon: Galen Manila, MD;  Location: [facility] ORS;  Service: Ophthalmology;  Laterality: Left;  [place]: 01:07.9 AP%: 23.0 CDE: 15.62 Lot# [id number]   CATARACT EXTRACTION W/PHACO Right 06/12/2015   Procedure: CATARACT EXTRACTION PHACO AND INTRAOCULAR LENS PLACEMENT (IOC);  Surgeon: Galen Manila, MD;  Location: [facility] ORS;  Service: Ophthalmology;  Laterality: Right;  [place] 00:57 AP% 17.1 CDE 9.86 fluid pack lot # [id number]   COLON SURGERY     resection   COLONOSCOPY WITH PROPOFOL N/A 10/29/2021   Procedure: COLONOSCOPY WITH PROPOFOL;  Surgeon: Regis Bill, MD;  Location: [facility] ENDOSCOPY;  Service: Endoscopy;  Laterality: N/A;   ECTOPIC PREGNANCY SURGERY     HERNIA REPAIR     TONSILLECTOMY       Home Medications   Prior to Admission medications   Medication Sig Start Date End Date Taking? Authorizing Provider  amLODipine (NORVASC) 10 MG tablet Take 10 mg by mouth daily before supper.    [provider]  ascorbic acid (VITAMIN C) 500 MG tablet Take 500 mg by mouth every evening.    [provider]  aspirin EC 81 MG tablet Take 81 mg by mouth daily. Swallow whole.    [provider]  atorvastatin (LIPITOR) 20 MG tablet Take 20 mg by mouth daily after supper.    [provider]  ferrous sulfate 325 (65 FE) MG tablet Take 1 tablet (325 mg total) by mouth every other day. 10/30/21 12/29/21  Lurene Shadow, MD  glipiZIDE (GLUCOTROL XL) 5 MG 24 hr tablet Take 5 mg by mouth 2 (two) times daily.     [provider]  hydrochlorothiazide (MICROZIDE) 12.5 MG capsule Take 12.5 mg by mouth daily. 04/11/21   [provider]  JARDIANCE 10 MG  TABS tablet Take 10 mg by mouth daily. 03/26/23   [provider]  linagliptin (TRADJENTA) 5 MG TABS tablet Take 1 tablet (5 mg total) by mouth daily. 04/15/23 05/15/23  Charise Killian, MD  lisinopril (ZESTRIL) 40 MG tablet Take 40 mg by mouth daily. 04/11/21   [provider]  metFORMIN (GLUCOPHAGE) 500 MG tablet Take 500 mg by mouth 2 (two) times daily with a meal.     [provider]  Multiple Vitamin (MULTIVITAMIN WITH MINERALS) TABS tablet Take 1 tablet by mouth daily.    [provider]     Allergies  Atenolol and Naprosyn [naproxen]   Family  History  History reviewed. No pertinent family history.   Physical Exam  Triage Vital Signs: ED Triage Vitals  Encounter Vitals Group     BP 07/13/23 1951 (!) 148/58     Systolic BP Percentile --      Diastolic BP Percentile --      Pulse Rate 07/13/23 1951 84     Resp 07/13/23 1951 16     Temp 07/13/23 1951 98.4 F (36.9 C)     Temp Source 07/13/23 1951 Oral     SpO2 07/13/23 1951 100 %     Weight 07/13/23 1952 110 lb (49.9 kg)     Height 07/13/23 1952 5\' 6"  (1.676 m)     Head Circumference --      Peak Flow --      Pain Score 07/13/23 2013 0     Pain Loc --      Pain Education --      Exclude from Growth Chart --     Updated Vital Signs: BP 126/69   Pulse 71   Temp (!) 97.5 F (36.4 C) (Oral)   Resp (!) 21   Ht 5\' 6"  (1.676 m)   Wt 49.9 kg   SpO2 100%   BMI 17.75 kg/m    General: Awake, no distress.  CV:  RRR. Good peripheral perfusion.  Resp:  Normal effort.  CTAB. Abd:  Cachectic.  Nontender.  No distention.  Other:  No pedal edema.   ED Results / Procedures / Treatments  Labs (all labs ordered are listed, but only abnormal results are displayed) Labs Reviewed  COMPREHENSIVE METABOLIC PANEL WITH GFR - Abnormal; Notable for the following components:      Result Value   Glucose, Bld 192 (*)    BUN 31 (*)    All other components within normal limits  CBC - Abnormal; Notable for the following components:   RBC 3.85 (*)    Hemoglobin 10.3 (*)    HCT 32.5 (*)    RDW 15.9 (*)    All other components within normal limits  POC OCCULT BLOOD, ED  TYPE AND SCREEN     EKG  ED ECG REPORT I, Yuto Cajuste J, the attending physician, personally viewed and interpreted this ECG.   Date: 07/14/2023  EKG Time: 0410  Rate: 70  Rhythm: normal sinus rhythm  Axis: Normal  Intervals:right bundle branch block  ST&T Change: Nonspecific    RADIOLOGY I have independently visualized and interpreted patient's imaging study as well as noted the radiology  interpretation:  CT abdomen/pelvis: No acute abnormalities  Official radiology report(s): CT ABDOMEN PELVIS W CONTRAST Result Date: 07/14/2023 CLINICAL DATA:  Left lower quadrant abdominal pain, rectal bleeding EXAM: CT ABDOMEN AND PELVIS WITH CONTRAST TECHNIQUE: Multidetector CT imaging of the abdomen and pelvis was performed using the standard protocol following  bolus administration of intravenous contrast. RADIATION DOSE REDUCTION: This exam was performed according to the departmental dose-optimization program which includes automated exposure control, adjustment of the mA and/or kV according to patient size and/or use of iterative reconstruction technique. CONTRAST:  75mL OMNIPAQUE IOHEXOL 300 MG/ML  SOLN COMPARISON:  05/29/2021 FINDINGS: Lower chest: No acute abnormality. Moderate coronary artery calcification. Hepatobiliary: Mild hepatic steatosis. No enhancing intrahepatic mass. No intra or extrahepatic biliary ductal dilation. Gallbladder unremarkable Pancreas: Atrophic but otherwise unremarkable Spleen: Unremarkable Adrenals/Urinary Tract: Multiple simple and hyperdense renal cortical cysts are seen within the kidneys bilaterally unchanged from prior examination and with Bosniak class 1 and class 2 cysts for which no follow-up imaging is recommended. The kidneys are otherwise unremarkable. Bladder is distended and multiple bladder diverticula are seen posteriorly in keeping with changes of chronic bladder obstruction. Stomach/Bowel: Left hemicolectomy has been performed. There is moderate to severe pancolonic diverticulosis involving the residual colon. Stomach, small bowel, and large bowel are otherwise unremarkable. No evidence of obstruction or focal inflammation. Appendix normal. No free intraperitoneal gas or fluid. Vascular/Lymphatic: Aortic atherosclerosis. No enlarged abdominal or pelvic lymph nodes. Reproductive: Uterus and bilateral adnexa are unremarkable. Other: Small left superior lumbar  hernia with herniation retroperitoneal fat. No abdominopelvic ascites. Musculoskeletal: Osseous structures are age-appropriate. No acute bone abnormality. IMPRESSION: 1. No acute intra-abdominal pathology identified. No definite radiographic explanation for the patient's reported symptoms. 2. Moderate coronary artery calcification. 3. Mild hepatic steatosis. 4. Moderate to severe pancolonic diverticulosis without superimposed acute inflammatory change. 5. Distended bladder with multiple bladder diverticula in keeping with changes of chronic bladder obstruction. Aortic Atherosclerosis (ICD10-I70.0). Electronically Signed   By: Helyn Numbers M.D.   On: 07/14/2023 03:36     PROCEDURES:  Critical Care performed: No  .1-3 Lead EKG Interpretation  Performed by: Irean Hong, MD Authorized by: Irean Hong, MD     Interpretation: normal     ECG rate:  70   ECG rate assessment: normal     Rhythm: sinus rhythm     Ectopy: none     Conduction: normal   Comments:     Patient placed on cardiac monitor to evaluate for arrhythmias  Rectal exam: External exam unremarkable.  Gross hematochezia noted in diaper.   MEDICATIONS ORDERED IN ED: Medications  iohexol (OMNIPAQUE) 300 MG/ML solution 75 mL (75 mLs Intravenous Contrast Given 07/14/23 0307)     IMPRESSION / MDM / ASSESSMENT AND PLAN / ED COURSE  I reviewed the triage vital signs and the nursing notes.                             89 year old female presenting with rectal bleeding. Differential diagnosis includes, but is not limited to, ovarian cyst, ovarian torsion, acute appendicitis, diverticulitis, urinary tract infection/pyelonephritis, endometriosis, bowel obstruction, colitis, renal colic, gastroenteritis, hernia, etc. I personally reviewed patient's records and note hospitalization 04/04/2023 for fall secondary to UTI.  Patient's presentation is most consistent with acute complicated illness / injury requiring diagnostic workup.  The  patient is on the cardiac monitor to evaluate for evidence of arrhythmia and/or significant heart rate changes.  Laboratory results demonstrate hemoglobin 10.3 which is stable from prior; unremarkable electrolytes.  Will check troponin, obtain EKG, CT abdomen/pelvis.  Anticipate hospitalization given evidence of gross hematochezia on rectal exam.  Clinical Course as of 07/14/23 0534  Tue Jul 14, 2023  0340 CT demonstrates no acute abnormalities.  Will consult hospitalist services  for evaluation and admission. [JS]    Clinical Course User Index [JS] Irean Hong, MD     FINAL CLINICAL IMPRESSION(S) / ED DIAGNOSES   Final diagnoses:  Rectal bleeding  Hematochezia     Rx / DC Orders   ED Discharge Orders     None        Note:  This document was prepared using Dragon voice recognition software and may include unintentional dictation errors.   Irean Hong, MD 07/14/23 [phone]

## 2023-07-14 NOTE — Assessment & Plan Note (Signed)
 BMI 17.8.

## 2023-07-14 NOTE — H&P (Addendum)
 History and Physical    Patient: Susan [name] JXB:[id number] DOB: 10/30/1926 DOA: 07/14/2023 DOS: the patient was seen and examined on 07/14/2023 PCP: [name], MD  Patient coming from: Home  Chief Complaint:  Chief Complaint  Patient presents with   Rectal Bleeding   HPI: Susan [name] is a 89 y.o. 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  Patient reports 2-3 bloody bowel movements at home.  Patient reports remote history of similar symptoms in the past associated with diverticular bleeding.  No chest pain or shortness of breath.  No nausea or vomiting.  No abdominal pain.  No frank diarrhea.  Denies any recent medication changes.  Noted admission December 2024 for issues including dehydration/AKI as well as UTI and weakness.  Patient denies any severe weakness at present.  Patient denies any known antiplatelet or anticoagulant use.  On baby aspirin in setting history of CVA. Presented to the ER afebrile, hemodynamically stable.  Satting well on room air.  White count 7.6, hemoglobin 10.3, platelets 219, creatinine 0.87.  Glucose 192.  CT abdomen pelvis showing moderate to severe pancolonic diverticulosis without superimposed inflammatory change.  Positive distended bladder in keeping with chronic bladder obstruction. Review of Systems: As mentioned in the history of present illness. All other systems reviewed and are negative. Past Medical History:  Diagnosis Date   Anemia    Balance problem    Diabetes mellitus without complication (HCC)    Diverticulosis    Heart murmur    Hypercholesteremia    Hypertension    Stroke [facility])    TIA 2006   TIA (transient ischemic attack) 2006   Past Surgical History:  Procedure Laterality Date   CATARACT EXTRACTION W/PHACO Left 05/22/2015   Procedure: CATARACT EXTRACTION PHACO AND INTRAOCULAR LENS PLACEMENT  (IOC);  Surgeon: Galen Manila, MD;  Location: [facility] ORS;  Service: Ophthalmology;  Laterality: Left;  [place]: 01:07.9 AP%: 23.0 CDE: 15.62 Lot# [id number]   CATARACT EXTRACTION W/PHACO Right 06/12/2015   Procedure: CATARACT EXTRACTION PHACO AND INTRAOCULAR LENS PLACEMENT (IOC);  Surgeon: Galen Manila, MD;  Location: [facility] ORS;  Service: Ophthalmology;  Laterality: Right;  [place] 00:57 AP% 17.1 CDE 9.86 fluid pack lot # [id number]   COLON SURGERY     resection   COLONOSCOPY WITH PROPOFOL N/A 10/29/2021   Procedure: COLONOSCOPY WITH PROPOFOL;  Surgeon: Regis Bill, MD;  Location: [facility] ENDOSCOPY;  Service: Endoscopy;  Laterality: N/A;   ECTOPIC PREGNANCY SURGERY     HERNIA REPAIR     TONSILLECTOMY     Social History:  reports that she has quit smoking. She has never used smokeless tobacco. She reports that she does not drink alcohol and does not use drugs.  Allergies  Allergen Reactions   Atenolol Other (See Comments)    "affected my heart rate"   Naprosyn [Naproxen] Other (See Comments)    GI Bleed    History reviewed. No pertinent family history.  Prior to Admission medications   Medication Sig Start Date End Date Taking? Authorizing Provider  amLODipine (NORVASC) 10 MG tablet Take 10 mg by mouth daily before supper.    [provider]  ascorbic acid (VITAMIN C) 500 MG tablet Take 500 mg by mouth every evening.    [provider]  aspirin EC 81 MG tablet Take 81 mg by mouth daily. Swallow whole.    [provider]  atorvastatin (LIPITOR)  20 MG tablet Take 20 mg by mouth daily after supper.    [provider]  ferrous sulfate 325 (65 FE) MG tablet Take 1 tablet (325 mg total) by mouth every other day. 10/30/21 12/29/21  Lurene Shadow, MD  glipiZIDE (GLUCOTROL XL) 5 MG 24 hr tablet Take 5 mg by mouth 2 (two) times daily.     [provider]  hydrochlorothiazide (MICROZIDE) 12.5 MG capsule Take 12.5 mg by mouth daily. 04/11/21   [provider]  JARDIANCE 10 MG TABS tablet Take 10 mg by mouth daily. 03/26/23   [provider]  linagliptin (TRADJENTA) 5 MG TABS tablet Take 1 tablet (5 mg total) by mouth daily. 04/15/23 05/15/23  Charise Killian, MD  lisinopril (ZESTRIL) 40 MG tablet Take 40 mg by mouth daily. 04/11/21   [provider]  metFORMIN (GLUCOPHAGE) 500 MG tablet Take 500 mg by mouth 2 (two) times daily with a meal.     [provider]  Multiple Vitamin (MULTIVITAMIN WITH MINERALS) TABS tablet Take 1 tablet by mouth daily.    [provider]    Physical Exam: Vitals:   07/14/23 0759 07/14/23 0830 07/14/23 1000 07/14/23 1130  BP:  (!) 124/55 132/62 (!) 130/56  Pulse:  69 67 73  Resp:  20 14 (!) 22  Temp: (!) 97.5 F (36.4 C)     TempSrc: Oral     SpO2:  100% 100% 100%  Weight:      Height:       Physical Exam Constitutional:      Comments: Underweight/cachectic  HENT:     Head: Normocephalic and atraumatic.     Nose: Nose normal.     Mouth/Throat:     Mouth: Mucous membranes are dry.  Eyes:     Pupils: Pupils are equal, round, and reactive to light.  Cardiovascular:     Rate and Rhythm: Normal rate and regular rhythm.  Pulmonary:     Effort: Pulmonary effort is normal.  Abdominal:     General: Bowel sounds are normal.  Musculoskeletal:        General: Normal range of motion.  Skin:    General: Skin is dry.  Neurological:     General: No focal deficit present.  Psychiatric:        Mood and Affect: Mood normal.     Data Reviewed:  There are no new results to review at this time.  CT ABDOMEN PELVIS W CONTRAST CLINICAL DATA:  Left lower quadrant abdominal pain, rectal bleeding  EXAM: CT ABDOMEN AND PELVIS WITH CONTRAST  TECHNIQUE: Multidetector CT imaging of the abdomen and pelvis was performed using the standard protocol following bolus administration of intravenous contrast.  RADIATION DOSE REDUCTION: This exam was performed according to  the departmental dose-optimization program which includes automated exposure control, adjustment of the mA and/or kV according to patient size and/or use of iterative reconstruction technique.  CONTRAST:  75mL OMNIPAQUE IOHEXOL 300 MG/ML  SOLN  COMPARISON:  05/29/2021  FINDINGS: Lower chest: No acute abnormality. Moderate coronary artery calcification.  Hepatobiliary: Mild hepatic steatosis. No enhancing intrahepatic mass. No intra or extrahepatic biliary ductal dilation. Gallbladder unremarkable  Pancreas: Atrophic but otherwise unremarkable  Spleen: Unremarkable  Adrenals/Urinary Tract: Multiple simple and hyperdense renal cortical cysts are seen within the kidneys bilaterally unchanged from prior examination and with Bosniak class 1 and class 2 cysts for which no follow-up imaging is recommended. The kidneys are otherwise unremarkable. Bladder is distended and multiple  bladder diverticula are seen posteriorly in keeping with changes of chronic bladder obstruction.  Stomach/Bowel: Left hemicolectomy has been performed. There is moderate to severe pancolonic diverticulosis involving the residual colon. Stomach, small bowel, and large bowel are otherwise unremarkable. No evidence of obstruction or focal inflammation. Appendix normal. No free intraperitoneal gas or fluid.  Vascular/Lymphatic: Aortic atherosclerosis. No enlarged abdominal or pelvic lymph nodes.  Reproductive: Uterus and bilateral adnexa are unremarkable.  Other: Small left superior lumbar hernia with herniation retroperitoneal fat. No abdominopelvic ascites.  Musculoskeletal: Osseous structures are age-appropriate. No acute bone abnormality.  IMPRESSION: 1. No acute intra-abdominal pathology identified. No definite radiographic explanation for the patient's reported symptoms. 2. Moderate coronary artery calcification. 3. Mild hepatic steatosis. 4. Moderate to severe pancolonic diverticulosis without  superimposed acute inflammatory change. 5. Distended bladder with multiple bladder diverticula in keeping with changes of chronic bladder obstruction.  Aortic Atherosclerosis (ICD10-I70.0).  Electronically Signed   By: Helyn Numbers M.D.   On: 07/14/2023 03:36  Lab Results  Component Value Date   WBC 7.6 07/13/2023   HGB 10.3 (L) 07/13/2023   HCT 32.5 (L) 07/13/2023   MCV 84.4 07/13/2023   PLT 219 07/13/2023   Last metabolic panel Lab Results  Component Value Date   GLUCOSE 192 (H) 07/13/2023   NA 139 07/13/2023   K 4.4 07/13/2023   CL 106 07/13/2023   CO2 26 07/13/2023   BUN 31 (H) 07/13/2023   CREATININE 0.87 07/13/2023   GFRNONAA >60 07/13/2023   CALCIUM 9.6 07/13/2023   PROT 7.7 07/13/2023   ALBUMIN 3.6 07/13/2023   BILITOT 0.4 07/13/2023   ALKPHOS 54 07/13/2023   AST 26 07/13/2023   ALT 16 07/13/2023   ANIONGAP 7 07/13/2023    Assessment and Plan: Lower GI bleed Diverticular bleeding Bloody bowel movements x 2 in the setting of extensive diverticular disease on CT imaging Recurring issue Hemoglobin stable at 10.3 Will monitor for now Trend hemoglobin Transfuse hemoglobin from 7 Consider formal GI consultation if significant decompensation Monitor  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 Observation status is judged to be reasonable and necessary in order to provide  the required intensity of service to ensure the patient's safety. The patient's presenting symptoms, physical exam findings, and initial radiographic and laboratory data in the context of their medical condition is felt to place them at decreased risk for further clinical deterioration. Furthermore, it is anticipated that the patient will be medically stable for discharge from the hospital within 2 midnights of admission.   Author: Floydene Flock, MD 07/14/2023 12:33 PM  For on call review [email or web address].

## 2023-07-15 DIAGNOSIS — R636 Underweight: Secondary | ICD-10-CM | POA: Diagnosis not present

## 2023-07-15 DIAGNOSIS — I639 Cerebral infarction, unspecified: Secondary | ICD-10-CM

## 2023-07-15 DIAGNOSIS — E1165 Type 2 diabetes mellitus with hyperglycemia: Secondary | ICD-10-CM

## 2023-07-15 DIAGNOSIS — K921 Melena: Secondary | ICD-10-CM | POA: Diagnosis not present

## 2023-07-15 LAB — COMPREHENSIVE METABOLIC PANEL WITH GFR
ALT: 14 U/L (ref 0–44)
AST: 23 U/L (ref 15–41)
Albumin: 3.1 g/dL — ABNORMAL LOW (ref 3.5–5.0)
Alkaline Phosphatase: 43 U/L (ref 38–126)
Anion gap: 7 (ref 5–15)
BUN: 20 mg/dL (ref 8–23)
CO2: 25 mmol/L (ref 22–32)
Calcium: 8.8 mg/dL — ABNORMAL LOW (ref 8.9–10.3)
Chloride: 104 mmol/L (ref 98–111)
Creatinine, Ser: 0.67 mg/dL (ref 0.44–1.00)
GFR, Estimated: >60 mL/min (ref >60)
Glucose, Bld: 99 mg/dL (ref 70–99)
Potassium: 4.4 mmol/L (ref 3.5–5.1)
Sodium: 136 mmol/L (ref 135–145)
Total Bilirubin: 0.6 mg/dL (ref 0.0–1.2)
Total Protein: 6.9 g/dL (ref 6.5–8.1)

## 2023-07-15 LAB — HEMOGLOBIN AND HEMATOCRIT, BLOOD
HCT: 29 % — ABNORMAL LOW (ref 36.0–46.0)
HCT: 32.1 % — ABNORMAL LOW (ref 36.0–46.0)
Hemoglobin: 10.5 g/dL — ABNORMAL LOW (ref 12.0–15.0)
Hemoglobin: 9.4 g/dL — ABNORMAL LOW (ref 12.0–15.0)

## 2023-07-15 LAB — GLUCOSE, CAPILLARY
Glucose-Capillary: 102 mg/dL — ABNORMAL HIGH (ref 70–99)
Glucose-Capillary: 296 mg/dL — ABNORMAL HIGH (ref 70–99)

## 2023-07-15 NOTE — Evaluation (Signed)
 Physical Therapy Evaluation Patient Details Name: Susan [name] MRN: [id number] DOB: [birth year] Today's Date: 07/15/2023  History of Present Illness  Susan [name] is a 89 y.o. 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  Clinical Impression  Patient received on [facility] with OT present. She transfers only at baseline to her wheelchair. Patient lives alone and has someone come in 3x a day to assist her. She is independent with wheelchair mobility in her apartment. Patient appears to be at baseline level of function. She does not require continued skilled PT. Signing off.           If plan is discharge home, recommend the following: A little help with walking and/or transfers;A little help with bathing/dressing/bathroom   Can travel by private vehicle    yes    Equipment Recommendations None recommended by PT  Recommendations for Other Services       Functional Status Assessment Patient has not had a recent decline in their functional status     Precautions / Restrictions Precautions Precautions: Fall Recall of Precautions/Restrictions: Intact Restrictions Weight Bearing Restrictions Per Provider Order: No      Mobility  Bed Mobility               General bed mobility comments: NT patient received sitting on BSC with OT present in room.    Transfers Overall transfer level: Needs assistance Equipment used: 1 person hand held assist Transfers: Sit to/from Stand, Bed to chair/wheelchair/BSC Sit to Stand: Min assist     Squat pivot transfers: Min assist          Ambulation/Gait               General Gait Details: patient not ambulatory at baseline. Transfers to wheelchair with assistance.  Stairs            Wheelchair Mobility     Tilt Bed    Modified Rankin (Stroke Patients Only)       Balance Overall  balance assessment: Needs assistance Sitting-balance support: Feet supported Sitting balance-Leahy Scale: Good     Standing balance support: Bilateral upper extremity supported, During functional activity Standing balance-Leahy Scale: Fair Standing balance comment: Requires B UE support                             Pertinent Vitals/Pain Pain Assessment Pain Assessment: No/denies pain    Home Living Family/patient expects to be discharged to:: Private residence Living Arrangements: Alone Available Help at Discharge: Family;Personal care attendant;Available PRN/intermittently Type of Home: Apartment Home Access: Elevator       Home Layout: One level Home Equipment: BSC/3in1;Wheelchair - manual;Shower seat      Prior Function Prior Level of Function : History of Falls (last six months);Needs assist             Mobility Comments: MOD IND using w/c ADLs Comments: assist for bathing/dressing, reports caregiver/family checks on her 3x/day     Extremity/Trunk Assessment   Upper Extremity Assessment Upper Extremity Assessment: Defer to OT evaluation    Lower Extremity Assessment Lower Extremity Assessment: Generalized weakness    Cervical / Trunk Assessment Cervical / Trunk Assessment: Normal  Communication   Communication Communication: No apparent difficulties    Cognition Arousal: Alert Behavior During Therapy: WFL for tasks assessed/performed   PT - Cognitive impairments: No apparent impairments  Following commands: Intact       Cueing Cueing Techniques: Verbal cues     General Comments      Exercises     Assessment/Plan    PT Assessment Patient does not need any further PT services  PT Problem List         PT Treatment Interventions      PT Goals (Current goals can be found in the Care Plan section)  Acute Rehab PT Goals Patient Stated Goal: to return home PT Goal Formulation: With  patient Time For Goal Achievement: 07/18/23 Potential to Achieve Goals: Good    Frequency       Co-evaluation               AM-PAC PT "6 Clicks" Mobility  Outcome Measure Help needed turning from your back to your side while in a flat bed without using bedrails?: A Little Help needed moving from lying on your back to sitting on the side of a flat bed without using bedrails?: A Little Help needed moving to and from a bed to a chair (including a wheelchair)?: A Little Help needed standing up from a chair using your arms (e.g., wheelchair or bedside chair)?: A Little Help needed to walk in hospital room?: Total Help needed climbing 3-5 steps with a railing? : Total 6 Click Score: 14    End of Session   Activity Tolerance: Patient tolerated treatment well Patient left: in chair;with call bell/phone within reach;with chair alarm set Nurse Communication: Mobility status      Time: 0865-7846 PT Time Calculation (min) (ACUTE ONLY): 11 min   Charges:   PT Evaluation $PT Eval Low Complexity: 1 Low   PT General Charges $$ ACUTE PT VISIT: 1 Visit         Bowman Higbie, PT, GCS 07/15/23,10:18 AM

## 2023-07-15 NOTE — Plan of Care (Signed)

## 2023-07-15 NOTE — Hospital Course (Addendum)
 Taken from H&P.   Susan [name] is a 89 y.o. 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  Patient reports 2-3 bloody bowel movements at home.  Noted admission December 2024 for issues including dehydration/AKI as well as UTI and weakness.   On presentation hemodynamically stable.  Labs with hemoglobin of 10.3. CT abdomen pelvis showing moderate to severe pancolonic diverticulosis without superimposed inflammatory change. Positive distended bladder in keeping with chronic bladder obstruction.   4/2: Vital stable, hemoglobin stable at 10.5.  Patient did not had any more bleeding per rectum since in the hospital.  Labs stable and she is eating and drinking fine.  Per patient she had a 24/7 care available at home.  PT with no specific recommendations.  Patient likely have diverticular bleed which has been resolved spontaneously.  Patient is on multiple antihypertensives at home and currently blood pressure seems stable without most of them.  We continued on home antihypertensives.  She need to check her blood pressure regularly and discussed with her primary care doctor so they can adjust her medications appropriately.  She will continue with rest of her home medications except baby aspirin which was discontinued to decrease the risk of bleeding.  Patient need to follow-up with primary care provider within a week for further recommendations.

## 2023-07-15 NOTE — Evaluation (Signed)
 Occupational Therapy Evaluation Patient Details Name: Susan [name] MRN: [id number] DOB: 01/29/1927 Today's Date: 07/15/2023   History of Present Illness   Susan [name] is a 89 y.o. 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   Clinical Impressions Susan [name] was seen for OT evaluation this date. Prior to hospital admission, pt was MOD I using w/c. Pt lives alone at senior living apartments with caregivers PRN. Pt currently requires MIN A for BSC t/f, SUPERVISION pericare in sitting. MIN A don underwear. CGA sit<>stand from [facility]. Pt would benefit from skilled OT to address noted impairments and functional limitations (see below for any additional details). Upon hospital discharge, recommend OT follow up.   If plan is discharge home, recommend the following:   Direct supervision/assist for medications management;Assistance with cooking/housework;A little help with bathing/dressing/bathroom     Functional Status Assessment   Patient has had a recent decline in their functional status and demonstrates the ability to make significant improvements in function in a reasonable and predictable amount of time.     Equipment Recommendations   BSC/3in1     Recommendations for Other Services         Precautions/Restrictions   Precautions Precautions: Fall Recall of Precautions/Restrictions: Intact Restrictions Weight Bearing Restrictions Per Provider Order: No     Mobility Bed Mobility Overal bed mobility: Needs Assistance Bed Mobility: Supine to Sit     Supine to sit: Contact guard          Transfers Overall transfer level: Needs assistance Equipment used: 1 person hand held assist Transfers: Bed to chair/wheelchair/BSC, Sit to/from Stand Sit to Stand: Contact guard assist   Squat pivot transfers: Min assist               Balance Overall balance assessment: Needs assistance Sitting-balance support: No upper extremity supported, Feet supported Sitting balance-Leahy Scale: Good     Standing balance support: Bilateral upper extremity supported Standing balance-Leahy Scale: Fair                             ADL either performed or assessed with clinical judgement   ADL Overall ADL's : Needs assistance/impaired                                       General ADL Comments: MIN A for BSC t/f, SUPERVISION pericare in sitting. MIN A don underwear      Pertinent Vitals/Pain Pain Assessment Pain Assessment: No/denies pain     Extremity/Trunk Assessment Upper Extremity Assessment Upper Extremity Assessment: Overall WFL for tasks assessed   Lower Extremity Assessment Lower Extremity Assessment: Generalized weakness       Communication Communication Communication: No apparent difficulties   Cognition Arousal: Alert Behavior During Therapy: WFL for tasks assessed/performed Cognition: History of cognitive impairments             OT - Cognition Comments: age appropriate cognition                 Following commands: Intact                  Home Living Family/patient expects to be discharged to:: Private residence Living Arrangements: Alone Available Help at Discharge: Family;Available PRN/intermittently Type of Home: Apartment Home Access:  Elevator     Home Layout: One level     Bathroom Shower/Tub: Walk-in shower         Home Equipment: BSC/3in1;Wheelchair - manual;Shower seat          Prior Functioning/Environment Prior Level of Function : History of Falls (last six months);Needs assist             Mobility Comments: MOD IND using w/c ADLs Comments: assist for bathing/dressing, reports caregiver/family checks on her 3x/day    OT Problem List: Decreased strength;Impaired balance (sitting and/or standing)   OT  Treatment/Interventions: Self-care/ADL training;Therapeutic exercise;Energy conservation;DME and/or AE instruction;Therapeutic activities      OT Goals(Current goals can be found in the care plan section)   Acute Rehab OT Goals Patient Stated Goal: to go home OT Goal Formulation: With patient Time For Goal Achievement: 07/29/23 Potential to Achieve Goals: Good ADL Goals Pt Will Perform Grooming: with modified independence;sitting Pt Will Perform Lower Body Dressing: sitting/lateral leans;with supervision;with set-up Pt Will Transfer to Toilet: with contact guard assist;bedside commode;squat pivot transfer   OT Frequency:  Min 3X/week    Co-evaluation              AM-PAC OT "6 Clicks" Daily Activity     Outcome Measure Help from another person eating meals?: None Help from another person taking care of personal grooming?: A Little Help from another person toileting, which includes using toliet, bedpan, or urinal?: A Little Help from another person bathing (including washing, rinsing, drying)?: A Little Help from another person to put on and taking off regular upper body clothing?: A Little Help from another person to put on and taking off regular lower body clothing?: A Lot 6 Click Score: 18   End of Session Nurse Communication: Mobility status  Activity Tolerance: Patient tolerated treatment well Patient left: in chair;with call bell/phone within reach;with chair alarm set  OT Visit Diagnosis: Other abnormalities of gait and mobility (R26.89)                Time: [id number] OT Time Calculation (min): 15 min Charges:  OT General Charges $OT Visit: 1 Visit OT Evaluation $OT Eval Low Complexity: 1 Low  Kathie Dike, M.S. OTR/L  07/15/23, 9:29 AM  ascom [phone]

## 2023-07-15 NOTE — Progress Notes (Signed)
 Reviewed discharge instructions with patient. Patient acknowledged understanding. Patient discharged with personal belongings. Patient wheeled out by staff. Patient transported home by Inetta Fermo from patient's home care service. No distress noted in patient.

## 2023-07-15 NOTE — Plan of Care (Signed)
 Patient alert and oriented. No complaints voiced. No distress noted. All patient needs attended to. Will continue to monitor.   Problem: Coping: Goal: Ability to adjust to condition or change in health will improve Outcome: Progressing   Problem: Fluid Volume: Goal: Ability to maintain a balanced intake and output will improve Outcome: Progressing   Problem: Clinical Measurements: Goal: Ability to maintain clinical measurements within normal limits will improve Outcome: Progressing   Problem: Elimination: Goal: Will not experience complications related to bowel motility Outcome: Progressing   Problem: Pain Managment: Goal: General experience of comfort will improve and/or be controlled Outcome: Progressing

## 2023-07-15 NOTE — Discharge Summary (Signed)
 Physician Discharge Summary   Patient: Susan [name] MRN: [id number] DOB: September 18, 1926  Admit date:     07/14/2023  Discharge date: 07/15/23  Discharge Physician: Arnetha Courser   PCP: [name], MD   Recommendations at discharge:  Please obtain CBC and BMP on follow-up Please consider decreasing antihypertensives-she is on multiple antihypertensives. We discontinued baby aspirin to decrease the risk of bleeding Follow-up with primary care provider within a week  Discharge Diagnoses: Principal Problem:   Hematochezia Active Problems:   Rectal bleeding   Uncontrolled type 2 diabetes mellitus with hyperglycemia, without long-term current use of insulin (HCC)   CVA (cerebral vascular accident) (HCC)   Underweight (BMI < 18.5)   Hospital Course: Taken from H&P.   Susan [name] is a 89 y.o. 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  Patient reports 2-3 bloody bowel movements at home.  Noted admission December 2024 for issues including dehydration/AKI as well as UTI and weakness.   On presentation hemodynamically stable.  Labs with hemoglobin of 10.3. CT abdomen pelvis showing moderate to severe pancolonic diverticulosis without superimposed inflammatory change. Positive distended bladder in keeping with chronic bladder obstruction.   4/2: Vital stable, hemoglobin stable at 10.5.  Patient did not had any more bleeding per rectum since in the hospital.  Labs stable and she is eating and drinking fine.  Per patient she had a 24/7 care available at home.  PT with no specific recommendations.  Patient likely have diverticular bleed which has been resolved spontaneously.  Patient is on multiple antihypertensives at home and currently blood pressure seems stable without most of them.  We continued on home antihypertensives.  She need to check her blood  pressure regularly and discussed with her primary care doctor so they can adjust her medications appropriately.  She will continue with rest of her home medications except baby aspirin which was discontinued to decrease the risk of bleeding.  Patient need to follow-up with primary care provider within a week for further recommendations.  Assessment and Plan: Rectal bleeding Diverticular bleeding Bloody bowel movements x 2 in the setting of extensive diverticular disease on CT imaging Recurring issue Hemoglobin stable at 10.3>>10.5 Home baby aspirin was discontinued.  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  At baseline today Hold ASA  Cont statin  Monitor  Consultants: None Procedures performed: None Disposition: Home Diet recommendation:  Discharge Diet Orders (From admission, onward)     Start     Ordered   07/15/23 0000  Diet - low sodium heart healthy        07/15/23 1117           Cardiac and Carb modified diet DISCHARGE MEDICATION: Allergies as of 07/15/2023       Reactions   Atenolol Other (See Comments)   "affected my heart rate"   Naprosyn [naproxen] Other (See Comments)   GI Bleed        Medication List     STOP taking these medications    aspirin EC 81 MG tablet   linagliptin 5 MG Tabs tablet Commonly known as: TRADJENTA  TAKE these medications    amLODipine 10 MG tablet Commonly known as: NORVASC Take 10 mg by mouth daily before supper.   ascorbic acid 500 MG tablet Commonly known as: VITAMIN C Take 500 mg by mouth every evening.   atorvastatin 20 MG tablet Commonly known as: LIPITOR Take 20 mg by mouth daily after supper.    ferrous sulfate 325 (65 FE) MG tablet Take 1 tablet (325 mg total) by mouth every other day.   glipiZIDE 5 MG 24 hr tablet Commonly known as: GLUCOTROL XL Take 5 mg by mouth 2 (two) times daily.   hydrochlorothiazide 12.5 MG capsule Commonly known as: MICROZIDE Take 12.5 mg by mouth daily.   Jardiance 10 MG Tabs tablet Generic drug: empagliflozin Take 10 mg by mouth daily.   lisinopril 40 MG tablet Commonly known as: ZESTRIL Take 40 mg by mouth daily.   metFORMIN 500 MG tablet Commonly known as: GLUCOPHAGE Take 500 mg by mouth 2 (two) times daily with a meal.   multivitamin with minerals Tabs tablet Take 1 tablet by mouth daily.        Follow-up Information     [name], MD. Schedule an appointment as soon as possible for a visit in 1 week(s).   Specialty: Family Medicine Contact information: [address] [place] [address] [phone]                Discharge Exam: Ceasar Mons Weights   07/13/23 1952  Weight: 49.9 kg   General.  Frail and malnourished elderly lady, in no acute distress. Pulmonary.  Lungs clear bilaterally, normal respiratory effort. CV.  Regular rate and rhythm, no JVD, rub or murmur. Abdomen.  Soft, nontender, nondistended, BS positive. CNS.  Alert and oriented .  No focal neurologic deficit. Extremities.  No edema, no cyanosis, pulses intact and symmetrical.  Condition at discharge: stable  The results of significant diagnostics from this hospitalization (including imaging, microbiology, ancillary and laboratory) are listed below for reference.   Imaging Studies: CT ABDOMEN PELVIS W CONTRAST Result Date: 07/14/2023 CLINICAL DATA:  Left lower quadrant abdominal pain, rectal bleeding EXAM: CT ABDOMEN AND PELVIS WITH CONTRAST TECHNIQUE: Multidetector CT imaging of the abdomen and pelvis was performed using the standard protocol following bolus administration of intravenous contrast. RADIATION DOSE REDUCTION: This exam  was performed according to the departmental dose-optimization program which includes automated exposure control, adjustment of the mA and/or kV according to patient size and/or use of iterative reconstruction technique. CONTRAST:  75mL OMNIPAQUE IOHEXOL 300 MG/ML  SOLN COMPARISON:  05/29/2021 FINDINGS: Lower chest: No acute abnormality. Moderate coronary artery calcification. Hepatobiliary: Mild hepatic steatosis. No enhancing intrahepatic mass. No intra or extrahepatic biliary ductal dilation. Gallbladder unremarkable Pancreas: Atrophic but otherwise unremarkable Spleen: Unremarkable Adrenals/Urinary Tract: Multiple simple and hyperdense renal cortical cysts are seen within the kidneys bilaterally unchanged from prior examination and with Bosniak class 1 and class 2 cysts for which no follow-up imaging is recommended. The kidneys are otherwise unremarkable. Bladder is distended and multiple bladder diverticula are seen posteriorly in keeping with changes of chronic bladder obstruction. Stomach/Bowel: Left hemicolectomy has been performed. There is moderate to severe pancolonic diverticulosis involving the residual colon. Stomach, small bowel, and large bowel are otherwise unremarkable. No evidence of obstruction or focal inflammation. Appendix normal. No free intraperitoneal gas or fluid. Vascular/Lymphatic: Aortic atherosclerosis. No enlarged abdominal or pelvic lymph nodes. Reproductive: Uterus and bilateral adnexa are unremarkable. Other: Small left superior lumbar hernia with herniation retroperitoneal fat. No  abdominopelvic ascites. Musculoskeletal: Osseous structures are age-appropriate. No acute bone abnormality. IMPRESSION: 1. No acute intra-abdominal pathology identified. No definite radiographic explanation for the patient's reported symptoms. 2. Moderate coronary artery calcification. 3. Mild hepatic steatosis. 4. Moderate to severe pancolonic diverticulosis without superimposed acute inflammatory  change. 5. Distended bladder with multiple bladder diverticula in keeping with changes of chronic bladder obstruction. Aortic Atherosclerosis (ICD10-I70.0). Electronically Signed   By: Helyn Numbers M.D.   On: 07/14/2023 03:36   CT Head Wo Contrast Result Date: 06/25/2023 CLINICAL DATA:  Fall with hematoma to forehead EXAM: CT HEAD WITHOUT CONTRAST CT CERVICAL SPINE WITHOUT CONTRAST TECHNIQUE: Multidetector CT imaging of the head and cervical spine was performed following the standard protocol without intravenous contrast. Multiplanar CT image reconstructions of the cervical spine were also generated. RADIATION DOSE REDUCTION: This exam was performed according to the departmental dose-optimization program which includes automated exposure control, adjustment of the mA and/or kV according to patient size and/or use of iterative reconstruction technique. COMPARISON:  CT brain and cervical spine 05/12/2023 FINDINGS: CT HEAD FINDINGS Brain: No acute territorial infarction, hemorrhage or intracranial mass. Atrophy and mild chronic small vessel ischemic changes of the white matter. Stable ventricular size Vascular: No hyperdense vessel or unexpected calcification. Skull: Normal. Negative for fracture or focal lesion. Sinuses/Orbits: No acute finding. Other: Small forehead hematoma CT CERVICAL SPINE FINDINGS Alignment: Reversal of cervical lordosis. Trace anterolisthesis C4 on C5 and C5 on C6 without significant change. Facet alignment is normal Skull base and vertebrae: No acute fracture. No primary bone lesion or focal pathologic process. Band of sclerosis superior aspect of T2 as before. Stable lucent lesion at C4. Soft tissues and spinal canal: No prevertebral fluid or swelling. No visible canal hematoma. Disc levels: Moderate severe diffuse disc space narrowing C4 through T1. Multilevel facet degenerative change with foraminal narrowing Upper chest: Negative. Other: None IMPRESSION: 1. No CT evidence for acute  intracranial abnormality. Atrophy and chronic small vessel ischemic changes of the white matter. 2. Reversal of cervical lordosis with multilevel degenerative change. No acute osseous abnormality. Electronically Signed   By: Jasmine Pang M.D.   On: 06/25/2023 23:28   CT Cervical Spine Wo Contrast Result Date: 06/25/2023 CLINICAL DATA:  Fall with hematoma to forehead EXAM: CT HEAD WITHOUT CONTRAST CT CERVICAL SPINE WITHOUT CONTRAST TECHNIQUE: Multidetector CT imaging of the head and cervical spine was performed following the standard protocol without intravenous contrast. Multiplanar CT image reconstructions of the cervical spine were also generated. RADIATION DOSE REDUCTION: This exam was performed according to the departmental dose-optimization program which includes automated exposure control, adjustment of the mA and/or kV according to patient size and/or use of iterative reconstruction technique. COMPARISON:  CT brain and cervical spine 05/12/2023 FINDINGS: CT HEAD FINDINGS Brain: No acute territorial infarction, hemorrhage or intracranial mass. Atrophy and mild chronic small vessel ischemic changes of the white matter. Stable ventricular size Vascular: No hyperdense vessel or unexpected calcification. Skull: Normal. Negative for fracture or focal lesion. Sinuses/Orbits: No acute finding. Other: Small forehead hematoma CT CERVICAL SPINE FINDINGS Alignment: Reversal of cervical lordosis. Trace anterolisthesis C4 on C5 and C5 on C6 without significant change. Facet alignment is normal Skull base and vertebrae: No acute fracture. No primary bone lesion or focal pathologic process. Band of sclerosis superior aspect of T2 as before. Stable lucent lesion at C4. Soft tissues and spinal canal: No prevertebral fluid or swelling. No visible canal hematoma. Disc levels: Moderate severe diffuse disc space narrowing C4 through T1. Multilevel  facet degenerative change with foraminal narrowing Upper chest: Negative. Other:  None IMPRESSION: 1. No CT evidence for acute intracranial abnormality. Atrophy and chronic small vessel ischemic changes of the white matter. 2. Reversal of cervical lordosis with multilevel degenerative change. No acute osseous abnormality. Electronically Signed   By: Jasmine Pang M.D.   On: 06/25/2023 23:28   DG Wrist Complete Right Result Date: 06/25/2023 CLINICAL DATA:  Pain after fall. EXAM: RIGHT WRIST - COMPLETE 3+ VIEW COMPARISON:  None Available. FINDINGS: There is no evidence of fracture or dislocation. Chronic corticated density about the ulna styloid suggestive of remote injury. No erosive change. There is dorsal soft tissue edema. Vascular calcifications are seen. IMPRESSION: Dorsal soft tissue edema. No acute fracture or subluxation of the right wrist. Electronically Signed   By: Narda Rutherford M.D.   On: 06/25/2023 23:08    Microbiology: Results for orders placed or performed during the hospital encounter of 04/04/23  Resp panel by RT-PCR (RSV, Flu A&B, Covid) Anterior Nasal Swab     Status: None   Collection Time: 04/04/23  5:51 AM   Specimen: Anterior Nasal Swab  Result Value Ref Range Status   SARS Coronavirus 2 by RT PCR NEGATIVE NEGATIVE Final    Comment: (NOTE) SARS-CoV-2 target nucleic acids are NOT DETECTED.  The SARS-CoV-2 RNA is generally detectable in upper respiratory specimens during the acute phase of infection. The lowest concentration of SARS-CoV-2 viral copies this assay can detect is 138 copies/mL. A negative result does not preclude SARS-Cov-2 infection and should not be used as the sole basis for treatment or other patient management decisions. A negative result may occur with  improper specimen collection/handling, submission of specimen other than nasopharyngeal swab, presence of viral mutation(s) within the areas targeted by this assay, and inadequate number of viral copies(<138 copies/mL). A negative result must be combined with clinical  observations, patient history, and epidemiological information. The expected result is Negative.  Fact Sheet for Patients:  BloggerCourse.com  Fact Sheet for Healthcare Providers:  SeriousBroker.it  This test is no t yet approved or cleared by the [place] FDA and  has been authorized for detection and/or diagnosis of SARS-CoV-2 by FDA under an Emergency Use Authorization (EUA). This EUA will remain  in effect (meaning this test can be used) for the duration of the COVID-19 declaration under Section 564(b)(1) of the Act, 21 U.S.C.section 360bbb-3(b)(1), unless the authorization is terminated  or revoked sooner.       Influenza A by PCR NEGATIVE NEGATIVE Final   Influenza B by PCR NEGATIVE NEGATIVE Final    Comment: (NOTE) The Xpert Xpress SARS-CoV-2/FLU/RSV plus assay is intended as an aid in the diagnosis of influenza from Nasopharyngeal swab specimens and should not be used as a sole basis for treatment. Nasal washings and aspirates are unacceptable for Xpert Xpress SARS-CoV-2/FLU/RSV testing.  Fact Sheet for Patients: BloggerCourse.com  Fact Sheet for Healthcare Providers: SeriousBroker.it  This test is not yet approved or cleared by the [place] FDA and has been authorized for detection and/or diagnosis of SARS-CoV-2 by FDA under an Emergency Use Authorization (EUA). This EUA will remain in effect (meaning this test can be used) for the duration of the COVID-19 declaration under Section 564(b)(1) of the Act, 21 U.S.C. section 360bbb-3(b)(1), unless the authorization is terminated or revoked.     Resp Syncytial Virus by PCR NEGATIVE NEGATIVE Final    Comment: (NOTE) Fact Sheet for Patients: BloggerCourse.com  Fact Sheet for Healthcare Providers: SeriousBroker.it  This test is not yet approved or cleared by  the [place] and has been authorized for detection and/or diagnosis of SARS-CoV-2 by FDA under an Emergency Use Authorization (EUA). This EUA will remain in effect (meaning this test can be used) for the duration of the COVID-19 declaration under Section 564(b)(1) of the Act, 21 U.S.C. section 360bbb-3(b)(1), unless the authorization is terminated or revoked.  Performed at [facility], [address] [place], [place] [address]     Labs: CBC: Recent Labs  Lab 07/13/23 2002 07/14/23 1232 07/14/23 1822 07/15/23 0016 07/15/23 0544  WBC 7.6  --   --   --   --   HGB 10.3* 10.6* 10.5* 9.4* 10.5*  HCT 32.5* 33.6* 32.8* 29.0* 32.1*  MCV 84.4  --   --   --   --   PLT 219  --   --   --   --    Basic Metabolic Panel: Recent Labs  Lab 07/13/23 2002 07/15/23 0544  NA 139 136  K 4.4 4.4  CL 106 104  CO2 26 25  GLUCOSE 192* 99  BUN 31* 20  CREATININE 0.87 0.67  CALCIUM 9.6 8.8*   Liver Function Tests: Recent Labs  Lab 07/13/23 2002 07/15/23 0544  AST 26 23  ALT 16 14  ALKPHOS 54 43  BILITOT 0.4 0.6  PROT 7.7 6.9  ALBUMIN 3.6 3.1*   CBG: Recent Labs  Lab 07/14/23 1713 07/14/23 2120 07/15/23 0747  GLUCAP 242* 264* 102*    Discharge time spent: greater than 30 minutes.  This record has been created using Conservation officer, historic buildings. Errors have been sought and corrected,but may not always be located. Such creation errors do not reflect on the standard of care.   Signed: Arnetha Courser, MD [facility] 07/15/2023

## 2023-07-15 NOTE — Care Management Obs Status (Signed)
 MEDICARE OBSERVATION STATUS NOTIFICATION   Patient Details  Name: Susan [name] MRN: [id number] Date of Birth: 1927-03-15   Medicare Observation Status Notification Given:  Orland Dec, CMA 07/15/2023, 10:11 AM

## 2023-12-09 ENCOUNTER — Emergency Department

## 2023-12-09 ENCOUNTER — Other Ambulatory Visit: Payer: Self-pay

## 2023-12-09 ENCOUNTER — Emergency Department
Admission: EM | Admit: 2023-12-09 | Discharge: 2023-12-10 | Disposition: A | Discharge status: Home / Self Care | Attending: Emergency Medicine | Admitting: Emergency Medicine

## 2023-12-09 DIAGNOSIS — R42 Dizziness and giddiness: Secondary | ICD-10-CM

## 2023-12-09 DIAGNOSIS — I10 Essential (primary) hypertension: Secondary | ICD-10-CM | POA: Insufficient documentation

## 2023-12-09 DIAGNOSIS — E119 Type 2 diabetes mellitus without complications: Secondary | ICD-10-CM | POA: Diagnosis not present

## 2023-12-09 DIAGNOSIS — N3090 Cystitis, unspecified without hematuria: Secondary | ICD-10-CM | POA: Insufficient documentation

## 2023-12-09 DIAGNOSIS — E860 Dehydration: Secondary | ICD-10-CM | POA: Insufficient documentation

## 2023-12-09 LAB — CBC
HCT: 35.3 % — ABNORMAL LOW (ref 36.0–46.0)
Hemoglobin: 11.2 g/dL — ABNORMAL LOW (ref 12.0–15.0)
MCH: 26.3 pg (ref 26.0–34.0)
MCHC: 31.7 g/dL (ref 30.0–36.0)
MCV: 82.9 fL (ref 80.0–100.0)
Platelets: 242 K/uL (ref 150–400)
RBC: 4.26 MIL/uL (ref 3.87–5.11)
RDW: 16.1 % — ABNORMAL HIGH (ref 11.5–15.5)
WBC: 8.7 K/uL (ref 4.0–10.5)
nRBC: 0 % (ref 0.0–0.2)

## 2023-12-09 LAB — COMPREHENSIVE METABOLIC PANEL WITH GFR
ALT: 23 U/L (ref 0–44)
AST: 33 U/L (ref 15–41)
Albumin: 3.7 g/dL (ref 3.5–5.0)
Alkaline Phosphatase: 62 U/L (ref 38–126)
Anion gap: 9 (ref 5–15)
BUN: 26 mg/dL — ABNORMAL HIGH (ref 8–23)
CO2: 28 mmol/L (ref 22–32)
Calcium: 10 mg/dL (ref 8.9–10.3)
Chloride: 101 mmol/L (ref 98–111)
Creatinine, Ser: 1.12 mg/dL — ABNORMAL HIGH (ref 0.44–1.00)
GFR, Estimated: 45 mL/min — ABNORMAL LOW (ref >60)
Glucose, Bld: 183 mg/dL — ABNORMAL HIGH (ref 70–99)
Potassium: 4.2 mmol/L (ref 3.5–5.1)
Sodium: 138 mmol/L (ref 135–145)
Total Bilirubin: 0.6 mg/dL (ref 0.0–1.2)
Total Protein: 8.3 g/dL — ABNORMAL HIGH (ref 6.5–8.1)

## 2023-12-09 LAB — URINALYSIS, ROUTINE W REFLEX MICROSCOPIC
Bilirubin Urine: NEGATIVE
Glucose, UA: >=500 mg/dL — AB
Hgb urine dipstick: NEGATIVE
Ketones, ur: NEGATIVE mg/dL
Nitrite: NEGATIVE
Protein, ur: NEGATIVE mg/dL
RBC / HPF: 0 RBC/hpf (ref 0–5)
Specific Gravity, Urine: 1.017 (ref 1.005–1.030)
pH: 5 (ref 5.0–8.0)

## 2023-12-09 MED ORDER — CLONIDINE HCL 0.1 MG PO TABS
0.1000 mg | ORAL_TABLET | Freq: Once | ORAL | Status: AC
  Administered 2023-12-09: 0.1 mg via ORAL
  Filled 2023-12-09: qty 1

## 2023-12-09 MED ORDER — CEFDINIR 300 MG PO CAPS
300.0000 mg | ORAL_CAPSULE | Freq: Two times a day (BID) | ORAL | 0 refills | Status: AC
Start: 2023-12-09 — End: ?

## 2023-12-09 MED ORDER — SODIUM CHLORIDE 0.9 % IV BOLUS
500.0000 mL | Freq: Once | INTRAVENOUS | Status: AC
  Administered 2023-12-09: 500 mL via INTRAVENOUS

## 2023-12-09 MED ORDER — MECLIZINE HCL 25 MG PO TABS
12.5000 mg | ORAL_TABLET | Freq: Once | ORAL | Status: AC
  Administered 2023-12-09: 12.5 mg via ORAL
  Filled 2023-12-09: qty 1

## 2023-12-09 MED ORDER — SODIUM CHLORIDE 0.9 % IV BOLUS
500.0000 mL | Freq: Once | INTRAVENOUS | Status: AC
Start: 2023-12-09 — End: 2023-12-09
  Administered 2023-12-09: 500 mL via INTRAVENOUS

## 2023-12-09 MED ORDER — SODIUM CHLORIDE 0.9 % IV SOLN
1.0000 g | INTRAVENOUS | Status: AC
  Administered 2023-12-09: 1 g via INTRAVENOUS
  Filled 2023-12-09: qty 10

## 2023-12-09 NOTE — ED Triage Notes (Signed)
 Pt to ED via POV from home. Family reports pt was reporting dizziness and checked BP and it was 171/80. CBG 270 PTA. Denies HA, N/V/D, CP or SOB. Pt with hx of type 2 DM.

## 2023-12-09 NOTE — ED Notes (Signed)
 Pt on bedpan. Call bell within reach.

## 2023-12-09 NOTE — ED Provider Notes (Signed)
 [facility] Provider Note    Event Date/Time   First MD Initiated Contact with Patient 12/09/23 1448     (approximate)   History   Chief Complaint: Hypertension   HPI  Susan [name] is a 89 y.o. female with a history of diabetes, hypertension, prior stroke who comes ED complaining of dizziness that started at 8:30 AM today.  Waxing and waning but persistent.  Feels like everything is in motion and off balance.  She is currently wheelchair-bound but feels that she would have more difficulty maintaining her balance currently.  Denies headache chest pain palpitation shortness of breath.  No falls or trauma.  Normal oral intake, compliant with medications  Personal care aide at bedside notes that blood pressure has been elevated today at about 170/80 which is atypical.        Past Medical History:  Diagnosis Date   Anemia    Balance problem    Diabetes mellitus without complication (HCC)    Diverticulosis    Heart murmur    Hypercholesteremia    Hypertension    Stroke [facility])    TIA 2006   TIA (transient ischemic attack) 2006    Current Outpatient Rx   Order #: [id number] Class: Normal   Order #: [id number] Class: Historical Med   Order #: [id number] Class: Historical Med   Order #: [id number] Class: Historical Med   Order #: [id number] Class: Historical Med   Order #: [id number] Class: Normal   Order #: [id number] Class: Historical Med   Order #: [id number] Class: Historical Med   Order #: [id number] Class: Historical Med   Order #: [id number] Class: Historical Med   Order #: [id number] Class: Historical Med   Order #: [id number] Class: Historical Med    Past Surgical History:  Procedure Laterality Date   CATARACT EXTRACTION W/PHACO Left 05/22/2015   Procedure: CATARACT EXTRACTION PHACO AND INTRAOCULAR LENS PLACEMENT (IOC);  Surgeon: Elsie Carmine, MD;  Location: [facility] ORS;  Service: Ophthalmology;  Laterality: Left;  US : 01:07.9 AP%: 23.0 CDE: 15.62 Lot#  [id number]   CATARACT EXTRACTION W/PHACO Right 06/12/2015   Procedure: CATARACT EXTRACTION PHACO AND INTRAOCULAR LENS PLACEMENT (IOC);  Surgeon: Elsie Carmine, MD;  Location: [facility] ORS;  Service: Ophthalmology;  Laterality: Right;  US  00:57 AP% 17.1 CDE 9.86 fluid pack lot # [id number]   COLON SURGERY     resection   COLONOSCOPY WITH PROPOFOL  N/A 10/29/2021   Procedure: COLONOSCOPY WITH PROPOFOL ;  Surgeon: Maryruth Ole DASEN, MD;  Location: [facility] ENDOSCOPY;  Service: Endoscopy;  Laterality: N/A;   ECTOPIC PREGNANCY SURGERY     HERNIA REPAIR     TONSILLECTOMY      Physical Exam   Triage Vital Signs: ED Triage Vitals  Encounter Vitals Group     BP 12/09/23 1454 (!) 174/73     Girls Systolic BP Percentile --      Girls Diastolic BP Percentile --      Boys Systolic BP Percentile --      Boys Diastolic BP Percentile --      Pulse Rate 12/09/23 1450 81     Resp 12/09/23 1450 20     Temp 12/09/23 1450 98 F (36.7 C)     Temp Source 12/09/23 1450 Oral     SpO2 12/09/23 1450 98 %     Weight --      Height --      Head Circumference --      Peak Flow --      Pain Score 12/09/23 1450 0  Pain Loc --      Pain Education --      Exclude from Growth Chart --     Most recent vital signs: Vitals:   12/09/23 2130 12/09/23 2200  BP: (!) 142/60 (!) 125/57  Pulse: 70 73  Resp: (!) 25 (!) 22  Temp:    SpO2: 100% 100%    General: Awake, no distress.  CV:  Good peripheral perfusion.  Regular rate rhythm Resp:  Normal effort.  Clear lungs Abd:  No distention.  Soft nontender Other:  Cranial nerves II through XII intact.  No drift, bilateral upper extremity dysmetria.   ED Results / Procedures / Treatments   Labs (all labs ordered are listed, but only abnormal results are displayed) Labs Reviewed  COMPREHENSIVE METABOLIC PANEL WITH GFR - Abnormal; Notable for the following components:      Result Value   Glucose, Bld 183 (*)    BUN 26 (*)    Creatinine, Ser 1.12 (*)    Total  Protein 8.3 (*)    GFR, Estimated 45 (*)    All other components within normal limits  CBC - Abnormal; Notable for the following components:   Hemoglobin 11.2 (*)    HCT 35.3 (*)    RDW 16.1 (*)    All other components within normal limits  URINALYSIS, ROUTINE W REFLEX MICROSCOPIC - Abnormal; Notable for the following components:   APPearance CLOUDY (*)    Glucose, UA >=500 (*)    Leukocytes,Ua TRACE (*)    Bacteria, UA MANY (*)    All other components within normal limits  URINE CULTURE  CBG MONITORING, ED     EKG Interpreted by me Sinus rhythm rate of 74.  Normal axis, right bundle branch block.  No acute ischemic changes.   RADIOLOGY CT head interpreted by me, negative for intracranial hemorrhage or mass.  Radiology report reviewed   PROCEDURES:  Procedures   MEDICATIONS ORDERED IN ED: Medications  meclizine  (ANTIVERT ) tablet 12.5 mg (12.5 mg Oral Given 12/09/23 1517)  cloNIDine  (CATAPRES ) tablet 0.1 mg (0.1 mg Oral Given 12/09/23 1517)  sodium chloride  0.9 % bolus 500 mL (0 mLs Intravenous Stopped 12/09/23 2110)  sodium chloride  0.9 % bolus 500 mL (0 mLs Intravenous Stopped 12/09/23 2110)  cefTRIAXone  (ROCEPHIN ) 1 g in sodium chloride  0.9 % 100 mL IVPB (0 g Intravenous Stopped 12/09/23 2157)     IMPRESSION / MDM / ASSESSMENT AND PLAN / ED COURSE  I reviewed the triage vital signs and the nursing notes.  DDx: Electrolyte derangement, dehydration, AKI, anemia, stroke, UTI, symptomatic hypertension  Patient's presentation is most consistent with acute presentation with potential threat to life or bodily function.  Patient presents with acute dizziness/vertigo symptoms starting this morning 8:30 AM.  Also has poorly controlled blood pressure.  These are unusual for her.  Will check labs, CT head, give meclizine  and clonidine    Clinical Course as of 12/09/23 2235  Wed Dec 09, 2023  1857 Still dizzy after BP improved. Not able to provide UA sample. Will continue IVF  while also waiting for MRI. [PS]    Clinical Course User Index [PS] Viviann Pastor, MD    ----------------------------------------- 10:35 PM on 12/09/2023 ----------------------------------------- MRI unremarkable.  Urinalysis reveals UTI.  Patient feeling better after IV fluids.  Symptoms attributable to cystitis and dehydration causing orthostatic symptoms.  Now improved, comfortable with home management with her personal care aide and family support.  Rocephin  given.   FINAL CLINICAL IMPRESSION(S) / ED  DIAGNOSES   Final diagnoses:  Cystitis  Dehydration  Orthostatic dizziness     Rx / DC Orders   ED Discharge Orders          Ordered    cefdinir  (OMNICEF ) 300 MG capsule  2 times daily        12/09/23 2233             Note:  This document was prepared using Dragon voice recognition software and may include unintentional dictation errors.   Viviann Pastor, MD 12/09/23 2236

## 2023-12-09 NOTE — ED Notes (Signed)
 Patient transported to MRI

## 2023-12-09 NOTE — ED Notes (Signed)
 Pt caregiver Leotis contacted and will on her way to pick patient up.

## 2023-12-09 NOTE — ED Notes (Signed)
 Susan [name], caregiver, [phone]

## 2023-12-12 LAB — URINE CULTURE

## 2024-04-08 ENCOUNTER — Emergency Department
Admission: EM | Admit: 2024-04-08 | Discharge: 2024-04-09 | Disposition: A | Discharge status: Home / Self Care | Attending: Emergency Medicine | Admitting: Emergency Medicine

## 2024-04-08 ENCOUNTER — Other Ambulatory Visit: Payer: Self-pay

## 2024-04-08 DIAGNOSIS — K921 Melena: Secondary | ICD-10-CM | POA: Insufficient documentation

## 2024-04-08 LAB — CBC
HCT: 29.7 % — ABNORMAL LOW (ref 36.0–46.0)
Hemoglobin: 9.2 g/dL — ABNORMAL LOW (ref 12.0–15.0)
MCH: 26.1 pg (ref 26.0–34.0)
MCHC: 31 g/dL (ref 30.0–36.0)
MCV: 84.1 fL (ref 80.0–100.0)
Platelets: 241 K/uL (ref 150–400)
RBC: 3.53 MIL/uL — ABNORMAL LOW (ref 3.87–5.11)
RDW: 15.3 % (ref 11.5–15.5)
WBC: 7.6 K/uL (ref 4.0–10.5)
nRBC: 0 % (ref 0.0–0.2)

## 2024-04-08 LAB — COMPREHENSIVE METABOLIC PANEL WITH GFR
ALT: 16 U/L (ref 0–44)
AST: 26 U/L (ref 15–41)
Albumin: 4.1 g/dL (ref 3.5–5.0)
Alkaline Phosphatase: 69 U/L (ref 38–126)
Anion gap: 14 (ref 5–15)
BUN: 39 mg/dL — ABNORMAL HIGH (ref 8–23)
CO2: 24 mmol/L (ref 22–32)
Calcium: 9.8 mg/dL (ref 8.9–10.3)
Chloride: 101 mmol/L (ref 98–111)
Creatinine, Ser: 1.06 mg/dL — ABNORMAL HIGH (ref 0.44–1.00)
GFR, Estimated: 48 mL/min — ABNORMAL LOW
Glucose, Bld: 242 mg/dL — ABNORMAL HIGH (ref 70–99)
Potassium: 4.5 mmol/L (ref 3.5–5.1)
Sodium: 139 mmol/L (ref 135–145)
Total Bilirubin: 0.2 mg/dL (ref 0.0–1.2)
Total Protein: 8 g/dL (ref 6.5–8.1)

## 2024-04-08 NOTE — ED Triage Notes (Signed)
 Pt is coming for symptoms of nosebleed, and black tarry stool 2 to 3 days ago as well as reported hypotension not seen here in triage at this time. She had a nosebleed last week and this week, he nose is not currently bleeding at this time. She feels otherwise fine in triage at this time.

## 2024-04-09 NOTE — Discharge Instructions (Signed)
 As we discussed, the black stools you have could be the result of a slow upper GI bleed, but it could also be a result of being on an iron  supplement or other food or medication effects.  We discussed bringing you into the hospital, but since you are otherwise stable and asymptomatic, we all agreed that you would be better off going home and following up with your outpatient doctor for referral to gastroenterology.  However, if you develop new or worsening symptoms, please return immediately to the emergency department for additional evaluation

## 2024-04-09 NOTE — ED Provider Notes (Signed)
 "  [facility] Provider Note    Event Date/Time   First MD Initiated Contact with Patient 04/08/24 2325     (approximate)   History   Hypotension   HPI Susan [name] is a 89 y.o. female whose medical history includes but is not limited to CVA with some chronic expressive aphasia and a prior episode of bright red blood per rectum thought to be due to a diverticular bleed (this was about 8 months ago).  She presents tonight with her caregiver for evaluation of 3 to 4 days of black and tarry stools.  This is a relatively new finding.  She is not having excessive stooling such as bowel urgency, but each time she goes to the bathroom it is black.  She is not on any new medications and she has not been taking any NSAIDs after they told her to stop earlier in the year.  Her blood pressure at home was reportedly low although her blood pressure has been stable here.  She had a nosebleed last week and then once earlier this week but not currently.  She said that she feels fine and has not been lightheaded or dizzy and has not been having any vomiting.  She just wanted to make sure she was okay.  Her caregiver is with her at this time.  She has not had any abdominal pain or chest pain.     Physical Exam   Triage Vital Signs: ED Triage Vitals  Encounter Vitals Group     BP 04/08/24 2031 (!) 138/51     Girls Systolic BP Percentile --      Girls Diastolic BP Percentile --      Boys Systolic BP Percentile --      Boys Diastolic BP Percentile --      Pulse Rate 04/08/24 2031 85     Resp 04/08/24 2031 18     Temp 04/08/24 2031 98.4 F (36.9 C)     Temp src --      SpO2 04/08/24 2031 100 %     Weight 04/08/24 2031 43.5 kg (96 lb)     Height --      Head Circumference --      Peak Flow --      Pain Score 04/08/24 2036 0     Pain Loc --      Pain Education --      Exclude from Growth Chart --     Most recent vital signs: Vitals:   04/08/24 2031 04/09/24 0030  BP: (!)  138/51 (!) 134/57  Pulse: 85   Resp: 18   Temp: 98.4 F (36.9 C)   SpO2: 100%     General: Awake, no distress.  Patient is elderly but sharp, pleasant and conversant and interactive, making jokes with me and interacting appropriately with her caregiver. CV:  Good peripheral perfusion.  Regular rate and rhythm, normal heart sounds. Resp:  Normal effort. Speaking easily and comfortably, no accessory muscle usage nor intercostal retractions.  Lungs are clear to auscultation. Abd:  No distention.  She has no tenderness to palpation of her abdomen. Other:  Patient deferred rectal exam   ED Results / Procedures / Treatments   Labs (all labs ordered are listed, but only abnormal results are displayed) Labs Reviewed  COMPREHENSIVE METABOLIC PANEL WITH GFR - Abnormal; Notable for the following components:      Result Value   Glucose, Bld 242 (*)    BUN 39 (*)  Creatinine, Ser 1.06 (*)    GFR, Estimated 48 (*)    All other components within normal limits  CBC - Abnormal; Notable for the following components:   RBC 3.53 (*)    Hemoglobin 9.2 (*)    HCT 29.7 (*)    All other components within normal limits      PROCEDURES:  Critical Care performed: No  Procedures    IMPRESSION / MDM / ASSESSMENT AND PLAN / ED COURSE  I reviewed the triage vital signs and the nursing notes.                              Differential diagnosis includes, but is not limited to, upper GI bleed, stool color changes due to food or supplement, lower GI bleed.  Patient's presentation is most consistent with acute presentation with potential threat to life or bodily function.  Labs/studies ordered: CMP, CBC  Interventions/Medications given:  Medications - No data to display  (Note:  hospital course my include additional interventions and/or labs/studies not listed above.)   The patient's vital signs have been stable here and are very reassuring.  She has no tenderness to palpation of the  abdomen and is pleasant and asymptomatic other than black and tarry stools which was confirmed by her caregiver.  She has not recently started on any new medications but she is on an iron  supplement.  Her labs are all reassuring other than slight elevation of BUN and creatinine but she is tolerating oral intake without difficulty.  Her hemoglobin is 9.2 which is down slightly from her last hemoglobin from months ago, but it is consistent with her hemoglobin level for all the prior measurements over the last few years.  We discussed risks and benefits of keeping the patient in the hospital and I strongly considered hospitalization.  However, given her lack of symptoms and my concern that particularly given the influenza epidemic we are currently experiencing, she is more likely to get more sick in the hospital, either by an nosocomial infection, delirium, etc.  She states, and her caregiver confirms, that she would rather go home and follow-up with her primary care doctor for referral to gastroenterology.  I think that is very reasonable because at her age and comorbidities I think it is unlikely that she will be rushed to an invasive procedure requiring sedation while she is in the hospital.  I gave strict return precautions, however, and her caregiver will bring her back to the ED should she develop new or worsening symptoms.         FINAL CLINICAL IMPRESSION(S) / ED DIAGNOSES   Final diagnoses:  Black tarry stools     Rx / DC Orders   ED Discharge Orders     None        Note:  This document was prepared using Dragon voice recognition software and may include unintentional dictation errors.   Gordan Huxley, MD 04/09/24 [phone]  "
